# Patient Record
Sex: Male | Born: 1960 | Race: White | Hispanic: No | Marital: Married | Smoking: Never smoker
Health system: Southern US, Community
[De-identification: ages and names within clinical notes are randomized; demographics above are authoritative.]

## PROBLEM LIST (undated history)

## (undated) DIAGNOSIS — K219 Gastro-esophageal reflux disease without esophagitis: Secondary | ICD-10-CM

## (undated) DIAGNOSIS — E119 Type 2 diabetes mellitus without complications: Secondary | ICD-10-CM

## (undated) DIAGNOSIS — A159 Respiratory tuberculosis unspecified: Secondary | ICD-10-CM

## (undated) DIAGNOSIS — I10 Essential (primary) hypertension: Secondary | ICD-10-CM

## (undated) DIAGNOSIS — I209 Angina pectoris, unspecified: Secondary | ICD-10-CM

## (undated) DIAGNOSIS — E785 Hyperlipidemia, unspecified: Secondary | ICD-10-CM

## (undated) DIAGNOSIS — I219 Acute myocardial infarction, unspecified: Secondary | ICD-10-CM

## (undated) DIAGNOSIS — B029 Zoster without complications: Secondary | ICD-10-CM

## (undated) DIAGNOSIS — I2510 Atherosclerotic heart disease of native coronary artery without angina pectoris: Secondary | ICD-10-CM

## (undated) DIAGNOSIS — M1990 Unspecified osteoarthritis, unspecified site: Secondary | ICD-10-CM

## (undated) DIAGNOSIS — L03114 Cellulitis of left upper limb: Secondary | ICD-10-CM

## (undated) HISTORY — PX: COLONOSCOPY: SHX174

## (undated) HISTORY — DX: Cellulitis of left upper limb: L03.114

## (undated) HISTORY — PX: TONSILLECTOMY: SUR1361

---

## 2000-09-18 ENCOUNTER — Encounter (INDEPENDENT_AMBULATORY_CARE_PROVIDER_SITE_OTHER): Payer: Self-pay | Admitting: Specialist

## 2000-09-18 ENCOUNTER — Ambulatory Visit (HOSPITAL_BASED_OUTPATIENT_CLINIC_OR_DEPARTMENT_OTHER): Admission: RE | Admit: 2000-09-18 | Discharge: 2000-09-18 | Payer: Self-pay | Admitting: Oral Surgery

## 2001-10-03 ENCOUNTER — Encounter: Admission: RE | Admit: 2001-10-03 | Discharge: 2002-01-01 | Payer: Self-pay | Admitting: Family Medicine

## 2006-04-21 HISTORY — PX: PLANTAR FASCIA SURGERY: SHX746

## 2009-05-10 ENCOUNTER — Emergency Department (HOSPITAL_COMMUNITY): Admission: EM | Admit: 2009-05-10 | Discharge: 2009-05-11 | Payer: Self-pay | Admitting: Emergency Medicine

## 2011-02-28 LAB — BASIC METABOLIC PANEL
CO2: 29 mEq/L (ref 19–32)
Calcium: 9 mg/dL (ref 8.4–10.5)
Chloride: 100 mEq/L (ref 96–112)
GFR calc Af Amer: >60 mL/min (ref >60)
Potassium: 3.9 mEq/L (ref 3.5–5.1)
Sodium: 139 mEq/L (ref 135–145)

## 2011-02-28 LAB — CBC
HCT: 44.6 % (ref 39.0–52.0)
Hemoglobin: 15.2 g/dL (ref 13.0–17.0)
MCHC: 34.2 g/dL (ref 30.0–36.0)
RBC: 4.81 MIL/uL (ref 4.22–5.81)

## 2011-02-28 LAB — DIFFERENTIAL
Basophils Relative: 0 % (ref 0–1)
Eosinophils Relative: 1 % (ref 0–5)
Monocytes Absolute: 0.9 10*3/uL (ref 0.1–1.0)
Monocytes Relative: 9 % (ref 3–12)
Neutro Abs: 8.4 10*3/uL — ABNORMAL HIGH (ref 1.7–7.7)

## 2011-02-28 LAB — GLUCOSE, CAPILLARY: Glucose-Capillary: 150 mg/dL — ABNORMAL HIGH (ref 70–99)

## 2011-05-20 ENCOUNTER — Emergency Department (HOSPITAL_COMMUNITY)
Admission: EM | Admit: 2011-05-20 | Discharge: 2011-05-20 | Disposition: A | Discharge status: Home / Self Care | Payer: 59 | Attending: Emergency Medicine | Admitting: Emergency Medicine

## 2011-05-20 DIAGNOSIS — F1120 Opioid dependence, uncomplicated: Secondary | ICD-10-CM | POA: Insufficient documentation

## 2011-05-20 DIAGNOSIS — E7800 Pure hypercholesterolemia, unspecified: Secondary | ICD-10-CM | POA: Insufficient documentation

## 2011-05-20 DIAGNOSIS — E119 Type 2 diabetes mellitus without complications: Secondary | ICD-10-CM | POA: Insufficient documentation

## 2011-05-20 LAB — CBC
HCT: 49.5 % (ref 39.0–52.0)
MCHC: 33.9 g/dL (ref 30.0–36.0)
MCV: 91 fL (ref 78.0–100.0)
RDW: 13.3 % (ref 11.5–15.5)

## 2011-05-20 LAB — DIFFERENTIAL
Eosinophils Relative: 0 % (ref 0–5)
Lymphocytes Relative: 9 % — ABNORMAL LOW (ref 12–46)
Lymphs Abs: 1.1 10*3/uL (ref 0.7–4.0)
Monocytes Absolute: 0.8 10*3/uL (ref 0.1–1.0)

## 2011-05-20 LAB — COMPREHENSIVE METABOLIC PANEL
ALT: 41 U/L (ref 0–53)
AST: 27 U/L (ref 0–37)
CO2: 30 mEq/L (ref 19–32)
Chloride: 103 mEq/L (ref 96–112)
GFR calc non Af Amer: >60 mL/min (ref >60)
Potassium: 4.4 mEq/L (ref 3.5–5.1)
Sodium: 143 mEq/L (ref 135–145)
Total Bilirubin: 0.5 mg/dL (ref 0.3–1.2)

## 2011-05-20 LAB — RAPID URINE DRUG SCREEN, HOSP PERFORMED: Barbiturates: NOT DETECTED

## 2011-07-07 ENCOUNTER — Other Ambulatory Visit: Payer: Self-pay | Admitting: Podiatry

## 2011-07-07 DIAGNOSIS — M79673 Pain in unspecified foot: Secondary | ICD-10-CM

## 2011-07-21 ENCOUNTER — Other Ambulatory Visit: Payer: 59

## 2012-03-29 ENCOUNTER — Other Ambulatory Visit: Payer: Self-pay | Admitting: Family Medicine

## 2012-03-29 DIAGNOSIS — R209 Unspecified disturbances of skin sensation: Secondary | ICD-10-CM

## 2012-04-09 ENCOUNTER — Ambulatory Visit
Admission: RE | Admit: 2012-04-09 | Discharge: 2012-04-09 | Disposition: A | Discharge status: Home / Self Care | Payer: BC Managed Care – PPO | Source: Ambulatory Visit | Attending: Family Medicine | Admitting: Family Medicine

## 2012-04-09 DIAGNOSIS — R209 Unspecified disturbances of skin sensation: Secondary | ICD-10-CM

## 2012-10-02 ENCOUNTER — Other Ambulatory Visit: Payer: Self-pay | Admitting: Orthopedic Surgery

## 2012-10-02 ENCOUNTER — Encounter (HOSPITAL_BASED_OUTPATIENT_CLINIC_OR_DEPARTMENT_OTHER): Payer: Self-pay | Admitting: *Deleted

## 2012-10-02 NOTE — H&P (Signed)
  History of present illness: Patient is a pleasant 51 year old male coming in with left ankle pain.  Patient on some that he was running around with his kids and tripped over a branch.  Patient felt a pop and had severe pain and swelling of the lower extremity immediately.  Patient went in to an urgent care facility had x-rays done and was found to have a fracture of the ankle.  Patient was put in a Cam Walker made nonweightbearing on crutches and told to followup here.  Patient states that the pain has been still significant.  He has been using hydrocodone on a regular basis.  Patient is very concerned though because he needs to work for his livelihood and finds that this will be difficult.  Patient denies any numbness in the foot, describes.  No more as a shooting sharp pain with a constant dull ache.  Patient is now tried to be weightbearing since he was told not to.  Patient denies any fevers or chills.   PMHx: Diabetes Medications: Metformin and Lipitor.Allergies: No known drug allergies.  Hospitalizations: Unremarkable.Surgeries: Unremarkable.  ROS: 14 system review was done in unremarkable as related to the orthopedic problem.  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 Mood normal affect normal.  Weight: 265 pounds.  Height: 6 feet 1 inches.  BMI: 35.0.  Blood pressure: 100/70.  Left ankle exam: Patient does have 1+ effusion of the left ankle.  Patient does have some ecchymosis that is resolving.  He is tender to palpation over the medial as well as lateral malleolus.  Patient is nontender over the base of the fifth metatarsal.  Neurovascularly intact distally.  Skin exam: No signs of this infection.  No breakdown.  No rash. Respiratory exam: The patient speaks in full sentences.  No signs of stress.  No use of accessory  muscles.Vascular exam: Skins appropriately warm to touch.  No swelling.  No signs of compromise.  Xrays: X-rays were reviewed by me today.  Patient's x-rays from previous visit does show that he has a bimalleolar fracture.  Patient has a spiral fracture of the distal fibula as well as very transverse fracture of the distal medial malleolus.  This does show widening of the mortise.  Assessment: 1.  Bimalleolar left ankle fracture   Plan: At this time patient's ankle is unstable and does need surgical intervention for repair.  Discussed treatment options with patient and answered all questions.  Patient was given a prescription refill of Norco today.  Discussed with patient and likely be a short-arm splint for 3 months. I have had a prolonged discussion with the patient regarding the risk and benefits of the surgical procedure.  The patient understands the risks include but are not limited to bleeding infection and failure of the surgery to cure the problem and need for further surgery.  The patient understands there is a slight risk of death at the time of surgery.  The patient understands these risks along with the potential benefits and wishes to proceed with surgical intervention.  The patient will discuss the full surgical procedure with [facility] our surgical scheduler and the surgery will be set up at the patients convenience.  The patient will be followed in the office in the postoperative period.

## 2012-10-02 NOTE — Progress Notes (Signed)
Needs istat and ekg-computors were down-cindy could not do when pt here

## 2012-10-03 ENCOUNTER — Encounter (HOSPITAL_BASED_OUTPATIENT_CLINIC_OR_DEPARTMENT_OTHER)
Admission: RE | Disposition: A | Discharge status: Home / Self Care | Payer: Self-pay | Source: Ambulatory Visit | Attending: Orthopedic Surgery

## 2012-10-03 ENCOUNTER — Ambulatory Visit (HOSPITAL_BASED_OUTPATIENT_CLINIC_OR_DEPARTMENT_OTHER)
Admission: RE | Admit: 2012-10-03 | Discharge: 2012-10-03 | Disposition: A | Discharge status: Home / Self Care | Payer: BC Managed Care – PPO | Source: Ambulatory Visit | Attending: Orthopedic Surgery | Admitting: Orthopedic Surgery

## 2012-10-03 ENCOUNTER — Encounter (HOSPITAL_BASED_OUTPATIENT_CLINIC_OR_DEPARTMENT_OTHER): Payer: Self-pay | Admitting: *Deleted

## 2012-10-03 ENCOUNTER — Ambulatory Visit (HOSPITAL_BASED_OUTPATIENT_CLINIC_OR_DEPARTMENT_OTHER): Payer: BC Managed Care – PPO | Admitting: *Deleted

## 2012-10-03 DIAGNOSIS — S82843A Displaced bimalleolar fracture of unspecified lower leg, initial encounter for closed fracture: Secondary | ICD-10-CM

## 2012-10-03 DIAGNOSIS — E119 Type 2 diabetes mellitus without complications: Secondary | ICD-10-CM | POA: Insufficient documentation

## 2012-10-03 DIAGNOSIS — Z79899 Other long term (current) drug therapy: Secondary | ICD-10-CM | POA: Insufficient documentation

## 2012-10-03 DIAGNOSIS — X500XXA Overexertion from strenuous movement or load, initial encounter: Secondary | ICD-10-CM | POA: Insufficient documentation

## 2012-10-03 HISTORY — DX: Type 2 diabetes mellitus without complications: E11.9

## 2012-10-03 HISTORY — DX: Hyperlipidemia, unspecified: E78.5

## 2012-10-03 HISTORY — DX: Gastro-esophageal reflux disease without esophagitis: K21.9

## 2012-10-03 HISTORY — PX: ORIF ANKLE FRACTURE: SHX5408

## 2012-10-03 HISTORY — DX: Unspecified osteoarthritis, unspecified site: M19.90

## 2012-10-03 SURGERY — OPEN REDUCTION INTERNAL FIXATION (ORIF) ANKLE FRACTURE
Anesthesia: General | Site: Ankle | Laterality: Left | Wound class: Clean

## 2012-10-03 MED ORDER — BUPIVACAINE-EPINEPHRINE PF 0.5-1:200000 % IJ SOLN
INTRAMUSCULAR | Status: DC | PRN
  Administered 2012-10-03: 30 mL

## 2012-10-03 MED ORDER — OXYCODONE-ACETAMINOPHEN 5-325 MG PO TABS: 1.0000 | ORAL_TABLET | ORAL | Status: DC | PRN

## 2012-10-03 MED ORDER — BUPIVACAINE HCL (PF) 0.5 % IJ SOLN
INTRAMUSCULAR | Status: DC | PRN
  Administered 2012-10-03: 10 mL

## 2012-10-03 MED ORDER — CEFAZOLIN SODIUM-DEXTROSE 2-3 GM-% IV SOLR
2.0000 g | INTRAVENOUS | Status: AC
  Administered 2012-10-03: 3 g via INTRAVENOUS

## 2012-10-03 MED ORDER — OXYCODONE HCL 5 MG/5ML PO SOLN
5.0000 mg | Freq: Once | ORAL | Status: DC | PRN
Start: 2012-10-03 — End: 2012-10-03

## 2012-10-03 MED ORDER — MIDAZOLAM HCL 2 MG/2ML IJ SOLN
0.5000 mg | INTRAMUSCULAR | Status: DC | PRN
  Administered 2012-10-03: 2 mg via INTRAVENOUS

## 2012-10-03 MED ORDER — LIDOCAINE HCL (CARDIAC) 20 MG/ML IV SOLN
INTRAVENOUS | Status: DC | PRN
  Administered 2012-10-03: 30 mg via INTRAVENOUS

## 2012-10-03 MED ORDER — PROPOFOL 10 MG/ML IV BOLUS
INTRAVENOUS | Status: DC | PRN
  Administered 2012-10-03: 200 mg via INTRAVENOUS

## 2012-10-03 MED ORDER — LACTATED RINGERS IV SOLN
INTRAVENOUS | Status: DC
  Administered 2012-10-03 (×3): via INTRAVENOUS

## 2012-10-03 MED ORDER — OXYCODONE HCL 5 MG PO TABS: 5.0000 mg | ORAL_TABLET | Freq: Once | ORAL | Status: DC | PRN

## 2012-10-03 MED ORDER — ONDANSETRON HCL 4 MG/2ML IJ SOLN
INTRAMUSCULAR | Status: DC | PRN
  Administered 2012-10-03: 4 mg via INTRAVENOUS

## 2012-10-03 MED ORDER — CHLORHEXIDINE GLUCONATE 4 % EX LIQD: 60.0000 mL | Freq: Once | CUTANEOUS | Status: DC

## 2012-10-03 MED ORDER — HYDROMORPHONE HCL PF 1 MG/ML IJ SOLN: 0.2500 mg | INTRAMUSCULAR | Status: DC | PRN

## 2012-10-03 MED ORDER — FENTANYL CITRATE 0.05 MG/ML IJ SOLN
50.0000 ug | INTRAMUSCULAR | Status: DC | PRN
  Administered 2012-10-03: 100 ug via INTRAVENOUS

## 2012-10-03 SURGICAL SUPPLY — 77 items
BANDAGE ELASTIC 4 VELCRO ST LF (GAUZE/BANDAGES/DRESSINGS) ×4 IMPLANT
BANDAGE ELASTIC 6 VELCRO ST LF (GAUZE/BANDAGES/DRESSINGS) ×2 IMPLANT
BANDAGE ESMARK 6X9 LF (GAUZE/BANDAGES/DRESSINGS) ×1 IMPLANT
BIT DRILL 2.5X2.75 QC CALB (BIT) ×2 IMPLANT
BLADE SURG 10 STRL SS (BLADE) ×2 IMPLANT
BLADE SURG 15 STRL LF DISP TIS (BLADE) ×2 IMPLANT
BLADE SURG 15 STRL SS (BLADE) ×2
BNDG ESMARK 6X9 LF (GAUZE/BANDAGES/DRESSINGS) ×2
CANISTER SUCTION 1200CC (MISCELLANEOUS) ×2 IMPLANT
CHLORAPREP W/TINT 26ML (MISCELLANEOUS) ×2 IMPLANT
COVER TABLE BACK 60X90 (DRAPES) ×2 IMPLANT
CUFF TOURNIQUET SINGLE 24IN (TOURNIQUET CUFF) ×2 IMPLANT
CUFF TOURNIQUET SINGLE 34IN LL (TOURNIQUET CUFF) IMPLANT
DECANTER SPIKE VIAL GLASS SM (MISCELLANEOUS) IMPLANT
DRAPE EXTREMITY T 121X128X90 (DRAPE) ×2 IMPLANT
DRAPE OEC MINIVIEW 54X84 (DRAPES) ×2 IMPLANT
DRAPE SURG 17X23 STRL (DRAPES) ×2 IMPLANT
DRAPE U 20/CS (DRAPES) ×2 IMPLANT
DRAPE U-SHAPE 47X51 STRL (DRAPES) IMPLANT
DURA STEPPER LG (CAST SUPPLIES) ×2 IMPLANT
DURA STEPPER MED (CAST SUPPLIES) IMPLANT
DURA STEPPER SML (CAST SUPPLIES) IMPLANT
ELECT REM PT RETURN 9FT ADLT (ELECTROSURGICAL) ×2
ELECTRODE REM PT RTRN 9FT ADLT (ELECTROSURGICAL) ×1 IMPLANT
GAUZE SPONGE 4X4 16PLY XRAY LF (GAUZE/BANDAGES/DRESSINGS) IMPLANT
GAUZE XEROFORM 1X8 LF (GAUZE/BANDAGES/DRESSINGS) IMPLANT
GAUZE XEROFORM 5X9 LF (GAUZE/BANDAGES/DRESSINGS) ×2 IMPLANT
GLOVE BIO SURGEON STRL SZ7 (GLOVE) ×2 IMPLANT
GLOVE BIO SURGEON STRL SZ7.5 (GLOVE) ×2 IMPLANT
GLOVE BIOGEL PI IND STRL 7.0 (GLOVE) ×1 IMPLANT
GLOVE BIOGEL PI IND STRL 8 (GLOVE) ×2 IMPLANT
GLOVE BIOGEL PI INDICATOR 7.0 (GLOVE) ×1
GLOVE BIOGEL PI INDICATOR 8 (GLOVE) ×2
GOWN PREVENTION PLUS XLARGE (GOWN DISPOSABLE) ×4 IMPLANT
GOWN PREVENTION PLUS XXLARGE (GOWN DISPOSABLE) ×2 IMPLANT
KWIRE 4.0 X .045IN (WIRE) ×2 IMPLANT
KWIRE 4.0 X .062IN (WIRE) ×2 IMPLANT
NEEDLE HYPO 22GX1.5 SAFETY (NEEDLE) IMPLANT
NS IRRIG 1000ML POUR BTL (IV SOLUTION) ×2 IMPLANT
PACK BASIN DAY SURGERY FS (CUSTOM PROCEDURE TRAY) ×2 IMPLANT
PAD CAST 4YDX4 CTTN HI CHSV (CAST SUPPLIES) ×1 IMPLANT
PADDING CAST ABS 4INX4YD NS (CAST SUPPLIES) ×1
PADDING CAST ABS COTTON 4X4 ST (CAST SUPPLIES) ×1 IMPLANT
PADDING CAST COTTON 4X4 STRL (CAST SUPPLIES) ×1
PADDING CAST COTTON 6X4 STRL (CAST SUPPLIES) ×2 IMPLANT
PENCIL BUTTON HOLSTER BLD 10FT (ELECTRODE) ×2 IMPLANT
PLATE ACE 100DEG 7HOLE (Plate) ×2 IMPLANT
SCREW CANC LAG 4X45 (Screw) ×2 IMPLANT
SCREW CORTICAL 3.5MM  12MM (Screw) ×1 IMPLANT
SCREW CORTICAL 3.5MM  16MM (Screw) ×2 IMPLANT
SCREW CORTICAL 3.5MM 12MM (Screw) ×1 IMPLANT
SCREW CORTICAL 3.5MM 14MM (Screw) ×6 IMPLANT
SCREW CORTICAL 3.5MM 16MM (Screw) ×2 IMPLANT
SCREW NLOCK CANC HEX 4X16 (Screw) ×2 IMPLANT
SCREW NLOCK CANC HEX 4X35 (Screw) ×2 IMPLANT
SLEEVE SCD COMPRESS KNEE MED (MISCELLANEOUS) ×2 IMPLANT
SPLINT FAST PLASTER 5X30 (CAST SUPPLIES)
SPLINT PLASTER CAST FAST 5X30 (CAST SUPPLIES) IMPLANT
SPONGE GAUZE 4X4 12PLY (GAUZE/BANDAGES/DRESSINGS) ×2 IMPLANT
SPONGE LAP 18X18 X RAY DECT (DISPOSABLE) IMPLANT
SPONGE LAP 4X18 X RAY DECT (DISPOSABLE) ×2 IMPLANT
STAPLER VISISTAT (STAPLE) IMPLANT
STAPLER VISISTAT 35W (STAPLE) IMPLANT
SUCTION FRAZIER TIP 10 FR DISP (SUCTIONS) IMPLANT
SUT ETHILON 3 0 PS 1 (SUTURE) IMPLANT
SUT ETHILON 4 0 PS 2 18 (SUTURE) ×2 IMPLANT
SUT TICRON 1 T 12 (SUTURE) IMPLANT
SUT VIC AB 2-0 SH 27 (SUTURE)
SUT VIC AB 2-0 SH 27XBRD (SUTURE) IMPLANT
SUT VIC AB 3-0 FS2 27 (SUTURE) ×4 IMPLANT
SYR BULB 3OZ (MISCELLANEOUS) ×2 IMPLANT
SYR CONTROL 10ML LL (SYRINGE) IMPLANT
TOWEL OR 17X24 6PK STRL BLUE (TOWEL DISPOSABLE) ×2 IMPLANT
TUBE CONNECTING 20X1/4 (TUBING) ×2 IMPLANT
UNDERPAD 30X30 INCONTINENT (UNDERPADS AND DIAPERS) ×2 IMPLANT
WATER STERILE IRR 1000ML POUR (IV SOLUTION) ×2 IMPLANT
YANKAUER SUCT BULB TIP NO VENT (SUCTIONS) ×2 IMPLANT

## 2012-10-03 NOTE — Progress Notes (Signed)
Assisted Dr. Fitzgerald with left, ultrasound guided, popliteal/saphenous block. Side rails up, monitors on throughout procedure. See vital signs in flow sheet. Tolerated Procedure well. 

## 2012-10-03 NOTE — Anesthesia Preprocedure Evaluation (Addendum)
Anesthesia Evaluation  Patient identified by MRN, date of birth, ID band Patient awake    Reviewed: Allergy & Precautions, H&P , NPO status , Patient's Chart, lab work & pertinent test results  Airway Mallampati: II TM Distance: >3 FB Neck ROM: Full    Dental No notable dental hx. (+) Teeth Intact and Dental Advisory Given   Pulmonary neg pulmonary ROS,  breath sounds clear to auscultation  Pulmonary exam normal       Cardiovascular negative cardio ROS  Rhythm:Regular Rate:Normal     Neuro/Psych negative neurological ROS  negative psych ROS   GI/Hepatic Neg liver ROS, GERD-  Medicated and Controlled,  Endo/Other  diabetes, Type 2, Oral Hypoglycemic Agents  Renal/GU negative Renal ROS  negative genitourinary   Musculoskeletal   Abdominal   Peds  Hematology negative hematology ROS (+)   Anesthesia Other Findings   Reproductive/Obstetrics negative OB ROS                           Anesthesia Physical Anesthesia Plan  ASA: II  Anesthesia Plan: General and Regional   Post-op Pain Management:    Induction: Intravenous  Airway Management Planned: LMA  Additional Equipment:   Intra-op Plan:   Post-operative Plan: Extubation in OR  Informed Consent: I have reviewed the patients History and Physical, chart, labs and discussed the procedure including the risks, benefits and alternatives for the proposed anesthesia with the patient or authorized representative who has indicated his/her understanding and acceptance.   Dental advisory given  Plan Discussed with: CRNA  Anesthesia Plan Comments:         Anesthesia Quick Evaluation

## 2012-10-03 NOTE — Transfer of Care (Signed)
Immediate Anesthesia Transfer of Care Note  Patient: Jacob Downs  Procedure(s) Performed: Procedure(s) (LRB) with comments: OPEN REDUCTION INTERNAL FIXATION (ORIF) ANKLE FRACTURE (Left)  Patient Location: PACU  Anesthesia Type:GA combined with regional for post-op pain  Level of Consciousness: awake, sedated and patient cooperative  Airway & Oxygen Therapy: Patient Spontanous Breathing and Patient connected to face mask oxygen  Post-op Assessment: Report given to PACU RN and Post -op Vital signs reviewed and stable  Post vital signs: Reviewed and stable  Complications: No apparent anesthesia complications

## 2012-10-03 NOTE — Anesthesia Procedure Notes (Addendum)
Anesthesia Regional Block:  Popliteal block  Pre-Anesthetic Checklist: ,, timeout performed, Correct Patient, Correct Site, Correct Laterality, Correct Procedure, Correct Position, site marked, Risks and benefits discussed, pre-op evaluation, post-op pain management  Laterality: Left  Prep: Maximum Sterile Barrier Precautions used and chloraprep       Needles:  Injection technique: Single-shot  Needle Type: Echogenic Stimulator Needle          Additional Needles:  Procedures: ultrasound guided (picture in chart) and nerve stimulator Popliteal block  Nerve Stimulator or Paresthesia:  Response: Peroneal, 0.4 mA,  Response: Tibial, 0.4 mA,   Additional Responses:   Narrative:  Start time: 10/03/2012 2:00 PM End time: 10/03/2012 2:15 PM Injection made incrementally with aspirations every 5 mL. Anesthesiologist: Sampson Goon, MD  Additional Notes: 2% Lidocaine skin wheel. Saphenous block with 10cc of 0.5% Bupivicaine plain.  Popliteal block Procedure Name: LMA Insertion Date/Time: 10/03/2012 2:28 PM Performed by: Gar Gibbon Pre-anesthesia Checklist: Patient identified, Emergency Drugs available, Suction available and Patient being monitored Patient Re-evaluated:Patient Re-evaluated prior to inductionOxygen Delivery Method: Circle System Utilized Preoxygenation: Pre-oxygenation with 100% oxygen Intubation Type: IV induction Ventilation: Mask ventilation without difficulty LMA: LMA with gastric port inserted LMA Size: 5.0 Number of attempts: 1 Placement Confirmation: positive ETCO2 Tube secured with: Tape Dental Injury: Teeth and Oropharynx as per pre-operative assessment

## 2012-10-03 NOTE — Anesthesia Postprocedure Evaluation (Signed)
  Anesthesia Post-op Note  Patient: Jacob Downs  Procedure(s) Performed: Procedure(s) (LRB) with comments: OPEN REDUCTION INTERNAL FIXATION (ORIF) ANKLE FRACTURE (Left)  Patient Location: PACU  Anesthesia Type:GA combined with regional for post-op pain  Level of Consciousness: awake, alert  and oriented  Airway and Oxygen Therapy: Patient Spontanous Breathing  Post-op Pain: none  Post-op Assessment: Post-op Vital signs reviewed, Patient's Cardiovascular Status Stable, Respiratory Function Stable, Patent Airway and No signs of Nausea or vomiting  Post-op Vital Signs: Reviewed and stable  Complications: No apparent anesthesia complications

## 2012-10-03 NOTE — Op Note (Signed)
Preoperative diagnosis: Left ankle bimalleolar fracture unstable  Postoperative diagnosis: Same  Procedure: Open reduction internal fixation of left ankle bimalleolar fracture with 2 medial Dupuis 4 mm lag screws, on the lateral side we used a 7 hole one third tubular plate . Surgeon: Feliberto Gottron. Turner Daniels M.D.  First assistant: Shirl Harris PA-C  Anesthetic: Gen. LMA  Estimated blood loss: Minimal  Tourniquet time: 48 minutes  Indications for procedure: Patient slipped and fell 2 days ago, was seen at the urgent care Center and diagnosed with an unstable bimalleolar ankle fracture. At that time there is minimal widening of the mortise joint and patient was placed in a Cam Walker boot given crutches and told to followup with orthopedics. When patient presented to our office the mortise was widened 8-9 mm in the boot, indicating an unstable ankle fracture and we immediately scheduled  for surgery. Risks and benefits of surgery been discussed with the patient and patient is prepared for intervention to decrease pain and improve function.   Description of procedure: The patient was identified by arm band receive preoperative IV antibiotics in the holding area at, day surgery Center. Patient then received a right popliteal block, was taken to the operating room where the appropriate anesthetic monitors were attached and general LMA anesthesia was induced. A tourniquet was applied high to the left calf and prepped and draped in the usual sterile fashion from the toes to the tourniquet. A time out procedure was performed. The limb was then wrapped with an Esmarch bandage and the tourniquet inflated to 250 mm of mercury. A longitudinal incision was made 1/2 cm distal to the tip of the medial malleolus going proximally up over the malleolus for a distance of 5-6 cm. Small bleeders in the skin and subcutaneous tissue identified and cauterized periosteum around the fracture site was excised exposing the medial  malleolus fracture which was displaced. Using reduction forceps we then reduced the medial malleolus fracture and provisionally fixed it with a 0.625 K wire, and obtain C-arm images showing anatomic reduction. Anterior the K wire we then drilled a 2.5 mm hole and inserted a 45 mm x 4 mm DePuy partially-threaded cancellus screw obtaining good firm fixation. The posterior K wire was then removed and a second 35 mm x 4 mm partially-threaded cancellus screw was inserted giving 2 good screws in the medial malleolus. C-arm images were taken confirming anatomic reduction. On the lateral side we made an incision starting at the middle of the lateral malleolus and going proximally for 8 cm. Small bleeders in the subcutaneous tissue identified and cauterized and using tenotomy scissors we dissected down to the fracture site and reflected the periosteum anteriorly and posteriorly exposing the fracture. We removed blood and clot from the short oblique fracture site. We then reduced the fracture with a lion jaw clamp and a calibrated tenaculum. Because the fracture was about 3 cm above the mortise we selected a 7 hole one third tubular plate applied it to the shaft and lateral malleolus of the fibula and fixed the fracture with 4 bicortical proximal screws and 3 bicortical distal screws. A lateral drawer test was then performed confirming of the joint was both reduced and stable. Final C-arm images were obtained and saved. The tourniquet was let down small bleeders identified and cauterized, we then closed in layers with 3-0 Vicryl suture subcutaneously and subcuticular. A dressing of 0 from 4 x 4 dressing sponges web roll Ace wrap and a Cam Walker boot was then applied.  The patient was then awakened extubated and taken to the recovery without difficulty. She'll be 20-30 pounds weight bearing.

## 2012-10-04 ENCOUNTER — Encounter (HOSPITAL_BASED_OUTPATIENT_CLINIC_OR_DEPARTMENT_OTHER): Payer: Self-pay | Admitting: Orthopedic Surgery

## 2012-10-04 LAB — POCT I-STAT, CHEM 8
HCT: 45 % (ref 39.0–52.0)
Hemoglobin: 15.3 g/dL (ref 13.0–17.0)
Potassium: 4.6 mEq/L (ref 3.5–5.1)
Sodium: 139 mEq/L (ref 135–145)

## 2013-02-11 ENCOUNTER — Encounter: Payer: Self-pay | Admitting: Gastroenterology

## 2013-03-18 ENCOUNTER — Ambulatory Visit (AMBULATORY_SURGERY_CENTER): Payer: BC Managed Care – PPO | Admitting: *Deleted

## 2013-03-18 VITALS — Ht 72.0 in | Wt 250.0 lb

## 2013-03-18 DIAGNOSIS — Z1211 Encounter for screening for malignant neoplasm of colon: Secondary | ICD-10-CM

## 2013-03-18 MED ORDER — MOVIPREP 100 G PO SOLR: ORAL | Status: DC

## 2013-03-18 NOTE — Progress Notes (Signed)
emmi information sent to patient's wife e-mail. Wife & patient given emmi information.

## 2013-03-19 ENCOUNTER — Encounter: Payer: Self-pay | Admitting: Gastroenterology

## 2013-04-01 ENCOUNTER — Encounter: Payer: BC Managed Care – PPO | Admitting: Gastroenterology

## 2013-04-03 ENCOUNTER — Ambulatory Visit (AMBULATORY_SURGERY_CENTER): Payer: BC Managed Care – PPO | Admitting: Gastroenterology

## 2013-04-03 ENCOUNTER — Encounter: Payer: Self-pay | Admitting: Gastroenterology

## 2013-04-03 ENCOUNTER — Other Ambulatory Visit: Payer: Self-pay | Admitting: Gastroenterology

## 2013-04-03 VITALS — BP 126/75 | HR 69 | Temp 97.6°F | Resp 20 | Ht 72.0 in | Wt 250.0 lb

## 2013-04-03 DIAGNOSIS — Z538 Procedure and treatment not carried out for other reasons: Secondary | ICD-10-CM

## 2013-04-03 DIAGNOSIS — Z1211 Encounter for screening for malignant neoplasm of colon: Secondary | ICD-10-CM

## 2013-04-03 LAB — GLUCOSE, CAPILLARY
Glucose-Capillary: 109 mg/dL — ABNORMAL HIGH (ref 70–99)
Glucose-Capillary: 116 mg/dL — ABNORMAL HIGH (ref 70–99)

## 2013-04-03 MED ORDER — SODIUM CHLORIDE 0.9 % IV SOLN: 500.0000 mL | INTRAVENOUS | Status: DC

## 2013-04-03 NOTE — Patient Instructions (Addendum)
YOU HAD AN ENDOSCOPIC PROCEDURE TODAY AT THE [facility]: Refer to the procedure report that was given to you for any specific questions about what was found during the examination.  If the procedure report does not answer your questions, please call your gastroenterologist to clarify.  If you requested that your care partner not be given the details of your procedure findings, then the procedure report has been included in a sealed envelope for you to review at your convenience later.  YOU SHOULD EXPECT: Some feelings of bloating in the abdomen. Passage of more gas than usual.  Walking can help get rid of the air that was put into your GI tract during the procedure and reduce the bloating. If you had a lower endoscopy (such as a colonoscopy or flexible sigmoidoscopy) you may notice spotting of blood in your stool or on the toilet paper. If you underwent a bowel prep for your procedure, then you may not have a normal bowel movement for a few days.  DIET: Your first meal following the procedure should be a light meal and then it is ok to progress to your normal diet.  A half-sandwich or bowl of soup is an example of a good first meal.  Heavy or fried foods are harder to digest and may make you feel nauseous or bloated.  Likewise meals heavy in dairy and vegetables can cause extra gas to form and this can also increase the bloating.  Drink plenty of fluids but you should avoid alcoholic beverages for 24 hours.  ACTIVITY: Your care partner should take you home directly after the procedure.  You should plan to take it easy, moving slowly for the rest of the day.  You can resume normal activity the day after the procedure however you should NOT DRIVE or use heavy machinery for 24 hours (because of the sedation medicines used during the test).    SYMPTOMS TO REPORT IMMEDIATELY: A gastroenterologist can be reached at any hour.  During normal business hours, 8:30 AM to 5:00 PM Monday through Friday,  call [phone].  After hours and on weekends, please call the GI answering service at [phone] who will take a message and have the physician on call contact you.   Following lower endoscopy (colonoscopy or flexible sigmoidoscopy):  Excessive amounts of blood in the stool  Significant tenderness or worsening of abdominal pains  Swelling of the abdomen that is new, acute  Fever of 100F or higher  FOLLOW UP: If any biopsies were taken you will be contacted by phone or by letter within the next 1-3 weeks.  Call your gastroenterologist if you have not heard about the biopsies in 3 weeks.  Our staff will call the home number listed on your records the next business day following your procedure to check on you and address any questions or concerns that you may have at that time regarding the information given to you following your procedure. This is a courtesy call and so if there is no answer at the home number and we have not heard from you through the emergency physician on call, we will assume that you have returned to your regular daily activities without incident.  You had a poor prep.  You will need another colonoscopy next year with a double prep.  SIGNATURES/CONFIDENTIALITY: You and/or your care partner have signed paperwork which will be entered into your electronic medical record.  These signatures attest to the fact that that the information above on  your After Visit Summary has been reviewed and is understood.  Full responsibility of the confidentiality of this discharge information lies with you and/or your care-partner. 

## 2013-04-03 NOTE — Progress Notes (Signed)
Patient did not have preoperative order for IV antibiotic SSI prophylaxis. (G8918)  Patient did not experience any of the following events: a burn prior to discharge; a fall within the facility; wrong site/side/patient/procedure/implant event; or a hospital transfer or hospital admission upon discharge from the facility. (G8907)  

## 2013-04-03 NOTE — Op Note (Signed)
[facility] [address] [place], [address]   COLONOSCOPY PROCEDURE REPORT  PATIENT: Jacob Downs, Jacob Downs  MR#: [id number] BIRTHDATE: May 04, 1961 , 52  yrs. old GENDER: Male ENDOSCOPIST: Mardella Layman, MD, [facility] REFERRED BY: PROCEDURE DATE:  04/03/2013 PROCEDURE:   Colonoscopy, diagnostic ASA CLASS:   Class II INDICATIONS:Average risk patient for colon cancer. MEDICATIONS: propofol (Diprivan) 400mg  IV  DESCRIPTION OF PROCEDURE:   After the risks and benefits and of the procedure were explained, informed consent was obtained.  A digital rectal exam revealed no abnormalities of the rectum.    The LB [id number]  endoscope was introduced through the anus and advanced to the cecum, which was identified by both the appendix and ileocecal valve .  The quality of the prep was poor, using MoviPrep .  The instrument was then slowly withdrawn as the colon was fully examined.     COLON FINDINGS: The colon was redundant.  Manual abdominal counter-pressure was used to reach the cecum.   A normal appearing cecum, ileocecal valve, and appendiceal orifice were identified. The ascending, hepatic flexure, transverse, splenic flexure, descending, sigmoid colon and rectum appeared unremarkable.  No polyps or cancers were seen.     Retroflexed views revealed no abnormalities.     The scope was then withdrawn from the patient and the procedure completed.  COMPLICATIONS: There were no complications. ENDOSCOPIC IMPRESSION: 1.   The colon was redundant ..no lage polyps or obstruction note. 2.   Normal colon ..very limited exam per very,very poor prep!!!!!  RECOMMENDATIONS: Repeat Colonoscopy in 1 year...WILL NEED "DOUBLE PREP" PER LIMITED EXAM TODAY.....   REPEAT EXAM:  cc:  _______________________________ eSignedMardella Layman, MD, [facility] 04/03/2013 1:58 PM     PATIENT NAME:  [name], [name] MR#: [id number]

## 2013-04-04 ENCOUNTER — Telehealth: Payer: Self-pay | Admitting: *Deleted

## 2013-04-04 NOTE — Telephone Encounter (Signed)
No identifier, left message, follow-up  

## 2014-03-14 ENCOUNTER — Encounter: Payer: Self-pay | Admitting: Gastroenterology

## 2014-11-26 ENCOUNTER — Observation Stay (HOSPITAL_COMMUNITY): Payer: BLUE CROSS/BLUE SHIELD | Admitting: Anesthesiology

## 2014-11-26 ENCOUNTER — Encounter (HOSPITAL_COMMUNITY): Payer: Self-pay | Admitting: *Deleted

## 2014-11-26 ENCOUNTER — Observation Stay (HOSPITAL_COMMUNITY)
Admission: AD | Admit: 2014-11-26 | Discharge: 2014-11-26 | Disposition: A | Discharge status: Home / Self Care | Payer: BLUE CROSS/BLUE SHIELD | Source: Ambulatory Visit | Attending: Internal Medicine | Admitting: Internal Medicine

## 2014-11-26 ENCOUNTER — Other Ambulatory Visit: Payer: Self-pay | Admitting: Orthopedic Surgery

## 2014-11-26 ENCOUNTER — Encounter (HOSPITAL_COMMUNITY)
Admission: AD | Disposition: A | Discharge status: Home / Self Care | Payer: Self-pay | Source: Ambulatory Visit | Attending: Internal Medicine

## 2014-11-26 DIAGNOSIS — E785 Hyperlipidemia, unspecified: Secondary | ICD-10-CM | POA: Diagnosis not present

## 2014-11-26 DIAGNOSIS — Z7982 Long term (current) use of aspirin: Secondary | ICD-10-CM | POA: Insufficient documentation

## 2014-11-26 DIAGNOSIS — Z79899 Other long term (current) drug therapy: Secondary | ICD-10-CM | POA: Insufficient documentation

## 2014-11-26 DIAGNOSIS — L02511 Cutaneous abscess of right hand: Secondary | ICD-10-CM | POA: Diagnosis not present

## 2014-11-26 DIAGNOSIS — M1990 Unspecified osteoarthritis, unspecified site: Secondary | ICD-10-CM | POA: Insufficient documentation

## 2014-11-26 DIAGNOSIS — K219 Gastro-esophageal reflux disease without esophagitis: Secondary | ICD-10-CM | POA: Insufficient documentation

## 2014-11-26 DIAGNOSIS — E119 Type 2 diabetes mellitus without complications: Secondary | ICD-10-CM | POA: Insufficient documentation

## 2014-11-26 HISTORY — PX: I & D EXTREMITY: SHX5045

## 2014-11-26 HISTORY — DX: Zoster without complications: B02.9

## 2014-11-26 LAB — CBC WITH DIFFERENTIAL/PLATELET
Basophils Absolute: 0 10*3/uL (ref 0.0–0.1)
Basophils Relative: 0 % (ref 0–1)
Eosinophils Absolute: 0.1 10*3/uL (ref 0.0–0.7)
Eosinophils Relative: 1 % (ref 0–5)
HCT: 44.3 % (ref 39.0–52.0)
Hemoglobin: 15.4 g/dL (ref 13.0–17.0)
LYMPHS PCT: 20 % (ref 12–46)
Lymphs Abs: 1.4 10*3/uL (ref 0.7–4.0)
MCH: 31.5 pg (ref 26.0–34.0)
MCHC: 34.8 g/dL (ref 30.0–36.0)
MCV: 90.6 fL (ref 78.0–100.0)
MONO ABS: 0.7 10*3/uL (ref 0.1–1.0)
MONOS PCT: 10 % (ref 3–12)
Neutro Abs: 4.6 10*3/uL (ref 1.7–7.7)
Neutrophils Relative %: 69 % (ref 43–77)
PLATELETS: 148 10*3/uL — AB (ref 150–400)
RBC: 4.89 MIL/uL (ref 4.22–5.81)
RDW: 13 % (ref 11.5–15.5)
WBC: 6.7 10*3/uL (ref 4.0–10.5)

## 2014-11-26 LAB — BASIC METABOLIC PANEL
Anion gap: 11 (ref 5–15)
BUN: 13 mg/dL (ref 6–23)
CALCIUM: 9.2 mg/dL (ref 8.4–10.5)
CO2: 23 mmol/L (ref 19–32)
Chloride: 105 mEq/L (ref 96–112)
Creatinine, Ser: 0.74 mg/dL (ref 0.50–1.35)
GFR calc Af Amer: >90 mL/min (ref >90)
GFR calc non Af Amer: >90 mL/min (ref >90)
Glucose, Bld: 206 mg/dL — ABNORMAL HIGH (ref 70–99)
POTASSIUM: 4.7 mmol/L (ref 3.5–5.1)
SODIUM: 139 mmol/L (ref 135–145)

## 2014-11-26 LAB — GLUCOSE, CAPILLARY
Glucose-Capillary: 206 mg/dL — ABNORMAL HIGH (ref 70–99)
Glucose-Capillary: 187 mg/dL — ABNORMAL HIGH (ref 70–99)

## 2014-11-26 SURGERY — IRRIGATION AND DEBRIDEMENT EXTREMITY
Anesthesia: General | Site: Finger | Laterality: Right

## 2014-11-26 MED ORDER — HYDROMORPHONE HCL 1 MG/ML IJ SOLN
INTRAMUSCULAR | Status: AC
  Administered 2014-11-26: 0.5 mg via INTRAVENOUS
  Filled 2014-11-26: qty 1

## 2014-11-26 MED ORDER — SODIUM CHLORIDE 0.9 % IR SOLN
Status: DC | PRN
  Administered 2014-11-26: 1000 mL

## 2014-11-26 MED ORDER — LIDOCAINE HCL (CARDIAC) 20 MG/ML IV SOLN
INTRAVENOUS | Status: DC | PRN
  Administered 2014-11-26: 100 mg via INTRAVENOUS

## 2014-11-26 MED ORDER — FENTANYL CITRATE 0.05 MG/ML IJ SOLN
INTRAMUSCULAR | Status: AC
  Filled 2014-11-26: qty 5

## 2014-11-26 MED ORDER — ONDANSETRON HCL 4 MG/2ML IJ SOLN
INTRAMUSCULAR | Status: DC | PRN
  Administered 2014-11-26: 4 mg via INTRAVENOUS

## 2014-11-26 MED ORDER — VANCOMYCIN HCL 1000 MG IV SOLR
1000.0000 mg | INTRAVENOUS | Status: DC | PRN
  Administered 2014-11-26: 1000 mg via INTRAVENOUS

## 2014-11-26 MED ORDER — OXYCODONE-ACETAMINOPHEN 5-325 MG PO TABS
ORAL_TABLET | ORAL | Status: AC
  Filled 2014-11-26: qty 1

## 2014-11-26 MED ORDER — MIDAZOLAM HCL 2 MG/2ML IJ SOLN
INTRAMUSCULAR | Status: AC
  Filled 2014-11-26: qty 2

## 2014-11-26 MED ORDER — LACTATED RINGERS IV SOLN
INTRAVENOUS | Status: DC
  Administered 2014-11-26: 17:00:00 via INTRAVENOUS

## 2014-11-26 MED ORDER — PROPOFOL 10 MG/ML IV BOLUS
INTRAVENOUS | Status: DC | PRN
  Administered 2014-11-26: 190 mg via INTRAVENOUS

## 2014-11-26 MED ORDER — CHLORHEXIDINE GLUCONATE 4 % EX LIQD
60.0000 mL | Freq: Once | CUTANEOUS | Status: DC
  Filled 2014-11-26: qty 60

## 2014-11-26 MED ORDER — MIDAZOLAM HCL 5 MG/5ML IJ SOLN
INTRAMUSCULAR | Status: DC | PRN
  Administered 2014-11-26: 2 mg via INTRAVENOUS

## 2014-11-26 MED ORDER — OXYCODONE-ACETAMINOPHEN 5-325 MG PO TABS: 1.0000 | ORAL_TABLET | ORAL | Status: DC | PRN

## 2014-11-26 MED ORDER — OXYCODONE-ACETAMINOPHEN 5-325 MG PO TABS: 1.0000 | ORAL_TABLET | Freq: Once | ORAL | Status: DC

## 2014-11-26 MED ORDER — HYDROMORPHONE HCL 1 MG/ML IJ SOLN
0.2500 mg | INTRAMUSCULAR | Status: DC | PRN
  Administered 2014-11-26 (×4): 0.5 mg via INTRAVENOUS

## 2014-11-26 MED ORDER — VANCOMYCIN HCL IN DEXTROSE 1-5 GM/200ML-% IV SOLN
INTRAVENOUS | Status: AC
  Filled 2014-11-26: qty 200

## 2014-11-26 MED ORDER — FENTANYL CITRATE 0.05 MG/ML IJ SOLN
INTRAMUSCULAR | Status: DC | PRN
  Administered 2014-11-26: 100 ug via INTRAVENOUS
  Administered 2014-11-26: 50 ug via INTRAVENOUS

## 2014-11-26 MED ORDER — HYDROMORPHONE HCL 1 MG/ML IJ SOLN
INTRAMUSCULAR | Status: AC
  Filled 2014-11-26: qty 1

## 2014-11-26 MED ORDER — BUPIVACAINE HCL (PF) 0.25 % IJ SOLN
INTRAMUSCULAR | Status: DC | PRN
  Administered 2014-11-26: 2 mL

## 2014-11-26 MED ORDER — BUPIVACAINE HCL (PF) 0.25 % IJ SOLN
INTRAMUSCULAR | Status: AC
  Filled 2014-11-26: qty 30

## 2014-11-26 MED ORDER — PROMETHAZINE HCL 25 MG/ML IJ SOLN
6.2500 mg | INTRAMUSCULAR | Status: DC | PRN
Start: 2014-11-26 — End: 2014-11-27

## 2014-11-26 SURGICAL SUPPLY — 48 items
BANDAGE ELASTIC 3 VELCRO ST LF (GAUZE/BANDAGES/DRESSINGS) ×2 IMPLANT
BANDAGE ELASTIC 4 VELCRO ST LF (GAUZE/BANDAGES/DRESSINGS) ×2 IMPLANT
BNDG CMPR 9X4 STRL LF SNTH (GAUZE/BANDAGES/DRESSINGS)
BNDG CONFORM 2 STRL LF (GAUZE/BANDAGES/DRESSINGS) IMPLANT
BNDG ESMARK 4X9 LF (GAUZE/BANDAGES/DRESSINGS) IMPLANT
BNDG GAUZE ELAST 4 BULKY (GAUZE/BANDAGES/DRESSINGS) ×2 IMPLANT
CORDS BIPOLAR (ELECTRODE) ×2 IMPLANT
COVER SURGICAL LIGHT HANDLE (MISCELLANEOUS) ×4 IMPLANT
CUFF TOURNIQUET SINGLE 18IN (TOURNIQUET CUFF) ×2 IMPLANT
DECANTER SPIKE VIAL GLASS SM (MISCELLANEOUS) ×2 IMPLANT
DRAIN PENROSE 1/4X12 LTX STRL (WOUND CARE) IMPLANT
DRAPE SURG 17X23 STRL (DRAPES) ×2 IMPLANT
DRSG PAD ABDOMINAL 8X10 ST (GAUZE/BANDAGES/DRESSINGS) IMPLANT
DURAPREP 26ML APPLICATOR (WOUND CARE) IMPLANT
ELECT REM PT RETURN 9FT ADLT (ELECTROSURGICAL)
ELECTRODE REM PT RTRN 9FT ADLT (ELECTROSURGICAL) IMPLANT
GAUZE PACKING IODOFORM 1/4X5 (PACKING) IMPLANT
GAUZE SPONGE 4X4 12PLY STRL (GAUZE/BANDAGES/DRESSINGS) ×2 IMPLANT
GAUZE XEROFORM 1X8 LF (GAUZE/BANDAGES/DRESSINGS) IMPLANT
GLOVE SURG SYN 8.0 (GLOVE) ×2 IMPLANT
GOWN STRL REUS W/ TWL LRG LVL3 (GOWN DISPOSABLE) ×1 IMPLANT
GOWN STRL REUS W/ TWL XL LVL3 (GOWN DISPOSABLE) ×1 IMPLANT
GOWN STRL REUS W/TWL LRG LVL3 (GOWN DISPOSABLE) ×2
GOWN STRL REUS W/TWL XL LVL3 (GOWN DISPOSABLE) ×2
HANDPIECE INTERPULSE COAX TIP (DISPOSABLE)
KIT BASIN OR (CUSTOM PROCEDURE TRAY) ×2 IMPLANT
KIT ROOM TURNOVER OR (KITS) ×2 IMPLANT
MANIFOLD NEPTUNE II (INSTRUMENTS) ×2 IMPLANT
NEEDLE HYPO 25GX1X1/2 BEV (NEEDLE) IMPLANT
NEEDLE HYPO 25X1 1.5 SAFETY (NEEDLE) ×2 IMPLANT
NS IRRIG 1000ML POUR BTL (IV SOLUTION) ×2 IMPLANT
PACK ORTHO EXTREMITY (CUSTOM PROCEDURE TRAY) ×2 IMPLANT
PAD ARMBOARD 7.5X6 YLW CONV (MISCELLANEOUS) ×4 IMPLANT
PAD CAST 4YDX4 CTTN HI CHSV (CAST SUPPLIES) ×1 IMPLANT
PADDING CAST COTTON 4X4 STRL (CAST SUPPLIES) ×2
SET HNDPC FAN SPRY TIP SCT (DISPOSABLE) IMPLANT
SPONGE LAP 18X18 X RAY DECT (DISPOSABLE) ×2 IMPLANT
SUCTION FRAZIER TIP 10 FR DISP (SUCTIONS) ×2 IMPLANT
SUT VICRYL RAPIDE 4/0 PS 2 (SUTURE) ×2 IMPLANT
SYR 20CC LL (SYRINGE) IMPLANT
SYR CONTROL 10ML LL (SYRINGE) ×2 IMPLANT
TOWEL OR 17X24 6PK STRL BLUE (TOWEL DISPOSABLE) ×2 IMPLANT
TOWEL OR 17X26 10 PK STRL BLUE (TOWEL DISPOSABLE) ×2 IMPLANT
TUBE ANAEROBIC SPECIMEN COL (MISCELLANEOUS) ×2 IMPLANT
TUBE CONNECTING 12X1/4 (SUCTIONS) ×2 IMPLANT
UNDERPAD 30X30 INCONTINENT (UNDERPADS AND DIAPERS) ×2 IMPLANT
WATER STERILE IRR 1000ML POUR (IV SOLUTION) ×2 IMPLANT
YANKAUER SUCT BULB TIP NO VENT (SUCTIONS) IMPLANT

## 2014-11-26 NOTE — Anesthesia Preprocedure Evaluation (Signed)
Anesthesia Evaluation  Patient identified by MRN, date of birth, ID band Patient awake    Reviewed: Allergy & Precautions, NPO status , Patient's Chart, lab work & pertinent test results, Unable to perform ROS - Chart review only  History of Anesthesia Complications Negative for: history of anesthetic complications  Airway Mallampati: II  TM Distance: >3 FB Neck ROM: Full    Dental  (+) Teeth Intact,    Pulmonary neg pulmonary ROS,  breath sounds clear to auscultation        Cardiovascular Rhythm:Regular Rate:Normal     Neuro/Psych negative neurological ROS     GI/Hepatic GERD-  ,  Endo/Other  diabetesMorbid obesity  Renal/GU      Musculoskeletal  (+) Arthritis -,   Abdominal (+) + obese,   Peds  Hematology   Anesthesia Other Findings   Reproductive/Obstetrics                             Anesthesia Physical Anesthesia Plan  ASA: II and emergent  Anesthesia Plan: General   Post-op Pain Management:    Induction: Intravenous  Airway Management Planned: LMA  Additional Equipment:   Intra-op Plan:   Post-operative Plan: Extubation in OR  Informed Consent: I have reviewed the patients History and Physical, chart, labs and discussed the procedure including the risks, benefits and alternatives for the proposed anesthesia with the patient or authorized representative who has indicated his/her understanding and acceptance.   Dental advisory given  Plan Discussed with: CRNA and Surgeon  Anesthesia Plan Comments:         Anesthesia Quick Evaluation

## 2014-11-26 NOTE — Anesthesia Postprocedure Evaluation (Signed)
  Anesthesia Post-op Note  Patient: Jacob Downs  Procedure(s) Performed: Procedure(s): INCISION AND DRAINAGE OF RIGHT INDEX FINGER (Right)  Patient Location: PACU  Anesthesia Type:General  Level of Consciousness: awake and alert   Airway and Oxygen Therapy: Patient Spontanous Breathing  Post-op Pain: mild  Post-op Assessment: Post-op Vital signs reviewed  Post-op Vital Signs: stable  Last Vitals:  Filed Vitals:   11/26/14 2115  BP: 139/80  Pulse: 71  Temp:   Resp: 10    Complications: No apparent anesthesia complications

## 2014-11-26 NOTE — Op Note (Signed)
See note [id number]

## 2014-11-26 NOTE — Transfer of Care (Signed)
Immediate Anesthesia Transfer of Care Note  Patient: Jacob Downs  Procedure(s) Performed: Procedure(s): INCISION AND DRAINAGE OF RIGHT INDEX FINGER (Right)  Patient Location: PACU  Anesthesia Type:General  Level of Consciousness: awake, alert  and oriented  Airway & Oxygen Therapy: Patient Spontanous Breathing  Post-op Assessment: Report given to PACU RN and Post -op Vital signs reviewed and stable  Post vital signs: Reviewed and stable  Complications: No apparent anesthesia complications

## 2014-11-26 NOTE — H&P (Signed)
Jacob Downs is an 54 y.o. male.   Chief Complaint: right hand pain and swelling HPI: as above s/p 24 hour h/o increased pain and swelling around index MCPJ  Past Medical History  Diagnosis Date  . Diabetes mellitus without complication   . Hyperlipemia   . GERD (gastroesophageal reflux disease)   . Arthritis   . Shingles     4-5 years ago    Past Surgical History  Procedure Laterality Date  . Plantar fascia surgery  06,07    both feet  . Orif ankle fracture  10/03/2012    Procedure: OPEN REDUCTION INTERNAL FIXATION (ORIF) ANKLE FRACTURE;  Surgeon: Kerin Salen, MD;  Location: [place];  Service: Orthopedics;  Laterality: Left;  . Colonoscopy    . Tonsillectomy      as a child    Family History  Problem Relation Age of Onset  . Colon cancer Neg Hx    Social History:  reports that he has never smoked. He has never used smokeless tobacco. He reports that he drinks alcohol. He reports that he does not use illicit drugs.  Allergies: No Known Allergies  Medications Prior to Admission  Medication Sig Dispense Refill  . aspirin 81 MG tablet Take 81 mg by mouth daily.    Marland Kitchen atorvastatin (LIPITOR) 80 MG tablet Take 80 mg by mouth every evening.    . metFORMIN (GLUCOPHAGE) 500 MG tablet Take 500 mg by mouth 2 (two) times daily with a meal.    . ranitidine (ZANTAC) 150 MG tablet Take 150 mg by mouth as needed.     . famotidine (PEPCID) 20 MG tablet Take 20 mg by mouth as needed.      Results for orders placed or performed during the hospital encounter of 11/26/14 (from the past 48 hour(s))  CBC with Differential     Status: Abnormal   Collection Time: 11/26/14  4:08 PM  Result Value Ref Range   WBC 6.7 4.0 - 10.5 K/uL   RBC 4.89 4.22 - 5.81 MIL/uL   Hemoglobin 15.4 13.0 - 17.0 g/dL   HCT 44.3 39.0 - 52.0 %   MCV 90.6 78.0 - 100.0 fL   MCH 31.5 26.0 - 34.0 pg   MCHC 34.8 30.0 - 36.0 g/dL   RDW 13.0 11.5 - 15.5 %   Platelets 148 (L) 150 - 400 K/uL    Neutrophils Relative % 69 43 - 77 %   Neutro Abs 4.6 1.7 - 7.7 K/uL   Lymphocytes Relative 20 12 - 46 %   Lymphs Abs 1.4 0.7 - 4.0 K/uL   Monocytes Relative 10 3 - 12 %   Monocytes Absolute 0.7 0.1 - 1.0 K/uL   Eosinophils Relative 1 0 - 5 %   Eosinophils Absolute 0.1 0.0 - 0.7 K/uL   Basophils Relative 0 0 - 1 %   Basophils Absolute 0.0 0.0 - 0.1 K/uL  Basic metabolic panel     Status: Abnormal   Collection Time: 11/26/14  4:08 PM  Result Value Ref Range   Sodium 139 135 - 145 mmol/L    Comment: Please note change in reference range.   Potassium 4.7 3.5 - 5.1 mmol/L    Comment: Please note change in reference range.   Chloride 105 96 - 112 mEq/L   CO2 23 19 - 32 mmol/L   Glucose, Bld 206 (H) 70 - 99 mg/dL   BUN 13 6 - 23 mg/dL   Creatinine, Ser 0.74 0.50 -  1.35 mg/dL   Calcium 9.2 8.4 - 10.5 mg/dL   GFR calc non Af Amer >90 >90 mL/min   GFR calc Af Amer >90 >90 mL/min    Comment: (NOTE) The eGFR has been calculated using the CKD EPI equation. This calculation has not been validated in all clinical situations. eGFR's persistently <90 mL/min signify possible Chronic Kidney Disease.    Anion gap 11 5 - 15  Glucose, capillary     Status: Abnormal   Collection Time: 11/26/14  4:10 PM  Result Value Ref Range   Glucose-Capillary 206 (H) 70 - 99 mg/dL  Glucose, capillary     Status: Abnormal   Collection Time: 11/26/14  6:07 PM  Result Value Ref Range   Glucose-Capillary 187 (H) 70 - 99 mg/dL   No results found.  Review of Systems  All other systems reviewed and are negative.   Blood pressure 168/79, pulse 85, temperature 98.4 F (36.9 C), temperature source Oral, resp. rate 18, height 6' (1.829 m), weight 120.203 kg (265 lb), SpO2 97 %. Physical Exam  Constitutional: He is oriented to person, place, and time. He appears well-developed and well-nourished.  HENT:  Head: Normocephalic and atraumatic.  Cardiovascular: Normal rate.   Respiratory: Effort normal.   Musculoskeletal:       Right hand: He exhibits tenderness and swelling.  Right hand dorsal MCPJ swelling with ascending erythema onto hand and wrist  Neurological: He is alert and oriented to person, place, and time.  Skin: Skin is warm. There is erythema.  Psychiatric: He has a normal mood and affect. His behavior is normal. Judgment and thought content normal.     Assessment/Plan As above   Plan I and D  Makisha Marrin A 11/26/2014, 6:18 PM

## 2014-11-26 NOTE — Anesthesia Procedure Notes (Signed)
Procedure Name: LMA Insertion Date/Time: 11/26/2014 7:48 PM Performed by: Arlice ColtMANESS, Juni Glaab B Pre-anesthesia Checklist: Patient identified, Emergency Drugs available, Suction available, Patient being monitored and Timeout performed Patient Re-evaluated:Patient Re-evaluated prior to inductionOxygen Delivery Method: Circle system utilized Preoxygenation: Pre-oxygenation with 100% oxygen Intubation Type: IV induction LMA: LMA with gastric port inserted LMA Size: 5.0 Number of attempts: 1 Placement Confirmation: positive ETCO2 and breath sounds checked- equal and bilateral Tube secured with: Tape Dental Injury: Teeth and Oropharynx as per pre-operative assessment

## 2014-11-27 ENCOUNTER — Encounter (HOSPITAL_COMMUNITY): Payer: Self-pay | Admitting: Orthopedic Surgery

## 2014-11-27 NOTE — Op Note (Signed)
NAMHarless Litten:  Veronica, Tomer              ACCOUNT NO.:  [id number]  MEDICAL RECORD NO.:  [id number]  LOCATION:  [facility]                         FACILITY:  [facility]  PHYSICIAN:  Artist PaisMatthew A. Airica Schwartzkopf, M.D.DATE OF BIRTH:  May 19, 1961  DATE OF PROCEDURE:  11/26/2014 DATE OF DISCHARGE:  11/26/2014                              OPERATIVE REPORT   PREOPERATIVE DIAGNOSIS:  Right index finger, deep abscess.  POSTOPERATIVE DIAGNOSIS:  Right index finger, deep abscess.  PROCEDURE:  Incision and drainage of above with open packing.  SURGEON:  Artist PaisMatthew A. Mina MarbleWeingold, M.D.  ASSISTANT:  None.  ANESTHESIA:  General.  COMPLICATIONS:  None.  DRAINS:  None.  CULTURES:  Sent x2, Gram stain.  DESCRIPTION OF PROCEDURE:  The patient was taken to the operating suite. After the induction of adequate general anesthetic, right upper extremity was prepped and draped in usual sterile fashion.  Esmarch was used to exsanguinate the limb.  Tourniquet was inflated to 250 mmHg.  At this point in time, incision was made over the area between the metacarpophalangeal and proximal phalangeal joints, radial side, proximal phalanx, index finger, right.  Skin was incised.  Cloudy fluid and purulence were encountered.  This was cultured, aerobic, anaerobic, and Gram stain.  Dissection was carried down to the extensor sheath. There was some devitalized tissue that we removed.  The wound was irrigated with a liter of normal saline.  It was then packed open with quarter-inch Iodoform gauze, closed loosely with one 4-0 Vicryl Rapide suture in the middle of the wound.  4x4s fluffs and compressive bandage were applied.  The patient tolerated the procedure well, went to the recovery room in stable fashion.     Artist PaisMatthew A. Mina MarbleWeingold, M.D.    MAW/MEDQ  D:  11/26/2014  T:  11/27/2014  Job:  [id number]

## 2014-11-29 LAB — CULTURE, ROUTINE-ABSCESS: Gram Stain: NONE SEEN

## 2014-12-01 LAB — ANAEROBIC CULTURE: Gram Stain: NONE SEEN

## 2015-01-15 ENCOUNTER — Encounter: Payer: Self-pay | Admitting: Gastroenterology

## 2015-11-27 DIAGNOSIS — L03114 Cellulitis of left upper limb: Secondary | ICD-10-CM

## 2015-11-27 HISTORY — DX: Cellulitis of left upper limb: L03.114

## 2015-11-29 ENCOUNTER — Encounter (HOSPITAL_COMMUNITY): Payer: Self-pay | Admitting: Nurse Practitioner

## 2015-11-29 ENCOUNTER — Emergency Department (HOSPITAL_COMMUNITY): Payer: BLUE CROSS/BLUE SHIELD

## 2015-11-29 ENCOUNTER — Inpatient Hospital Stay (HOSPITAL_COMMUNITY)
Admission: EM | Admit: 2015-11-29 | Discharge: 2015-12-01 | DRG: 287 | Disposition: A | Discharge status: Home / Self Care | Payer: BLUE CROSS/BLUE SHIELD | Attending: Cardiology | Admitting: Cardiology

## 2015-11-29 DIAGNOSIS — Z8249 Family history of ischemic heart disease and other diseases of the circulatory system: Secondary | ICD-10-CM | POA: Diagnosis not present

## 2015-11-29 DIAGNOSIS — E669 Obesity, unspecified: Secondary | ICD-10-CM | POA: Diagnosis present

## 2015-11-29 DIAGNOSIS — I25110 Atherosclerotic heart disease of native coronary artery with unstable angina pectoris: Secondary | ICD-10-CM | POA: Diagnosis present

## 2015-11-29 DIAGNOSIS — E1165 Type 2 diabetes mellitus with hyperglycemia: Secondary | ICD-10-CM | POA: Diagnosis present

## 2015-11-29 DIAGNOSIS — I200 Unstable angina: Secondary | ICD-10-CM

## 2015-11-29 DIAGNOSIS — R079 Chest pain, unspecified: Secondary | ICD-10-CM

## 2015-11-29 DIAGNOSIS — Z7982 Long term (current) use of aspirin: Secondary | ICD-10-CM

## 2015-11-29 DIAGNOSIS — E119 Type 2 diabetes mellitus without complications: Secondary | ICD-10-CM | POA: Diagnosis not present

## 2015-11-29 DIAGNOSIS — E785 Hyperlipidemia, unspecified: Secondary | ICD-10-CM | POA: Diagnosis present

## 2015-11-29 DIAGNOSIS — L089 Local infection of the skin and subcutaneous tissue, unspecified: Secondary | ICD-10-CM

## 2015-11-29 DIAGNOSIS — Z7984 Long term (current) use of oral hypoglycemic drugs: Secondary | ICD-10-CM

## 2015-11-29 DIAGNOSIS — Z6833 Body mass index (BMI) 33.0-33.9, adult: Secondary | ICD-10-CM

## 2015-11-29 DIAGNOSIS — L02413 Cutaneous abscess of right upper limb: Secondary | ICD-10-CM | POA: Diagnosis present

## 2015-11-29 HISTORY — DX: Respiratory tuberculosis unspecified: A15.9

## 2015-11-29 LAB — BASIC METABOLIC PANEL
Anion gap: 12 (ref 5–15)
BUN: 10 mg/dL (ref 6–20)
CO2: 26 mmol/L (ref 22–32)
CREATININE: 0.73 mg/dL (ref 0.61–1.24)
Calcium: 8.9 mg/dL (ref 8.9–10.3)
Chloride: 101 mmol/L (ref 101–111)
GFR calc Af Amer: >60 mL/min (ref >60)
Glucose, Bld: 240 mg/dL — ABNORMAL HIGH (ref 65–99)
POTASSIUM: 4.2 mmol/L (ref 3.5–5.1)
Sodium: 139 mmol/L (ref 135–145)

## 2015-11-29 LAB — CBC
HCT: 47.2 % (ref 39.0–52.0)
Hemoglobin: 16.9 g/dL (ref 13.0–17.0)
MCH: 33 pg (ref 26.0–34.0)
MCHC: 35.8 g/dL (ref 30.0–36.0)
MCV: 92.2 fL (ref 78.0–100.0)
PLATELETS: 153 10*3/uL (ref 150–400)
RBC: 5.12 MIL/uL (ref 4.22–5.81)
RDW: 12.9 % (ref 11.5–15.5)
WBC: 6.1 10*3/uL (ref 4.0–10.5)

## 2015-11-29 LAB — I-STAT TROPONIN, ED: TROPONIN I, POC: 0 ng/mL (ref 0.00–0.08)

## 2015-11-29 LAB — GLUCOSE, CAPILLARY
GLUCOSE-CAPILLARY: 211 mg/dL — AB (ref 65–99)
GLUCOSE-CAPILLARY: 278 mg/dL — AB (ref 65–99)

## 2015-11-29 MED ORDER — SODIUM CHLORIDE 0.9 % IJ SOLN
3.0000 mL | Freq: Two times a day (BID) | INTRAMUSCULAR | Status: DC
  Administered 2015-11-29: 3 mL via INTRAVENOUS

## 2015-11-29 MED ORDER — DOXYCYCLINE HYCLATE 100 MG PO TABS
100.0000 mg | ORAL_TABLET | Freq: Two times a day (BID) | ORAL | Status: DC
  Administered 2015-11-29 – 2015-12-01 (×4): 100 mg via ORAL
  Filled 2015-11-29 (×4): qty 1

## 2015-11-29 MED ORDER — SODIUM CHLORIDE 0.9 % IV SOLN: 250.0000 mL | INTRAVENOUS | Status: DC | PRN

## 2015-11-29 MED ORDER — HEPARIN BOLUS VIA INFUSION
4000.0000 [IU] | Freq: Once | INTRAVENOUS | Status: AC
Start: 2015-11-29 — End: 2015-11-29
  Administered 2015-11-29: 4000 [IU] via INTRAVENOUS
  Filled 2015-11-29: qty 4000

## 2015-11-29 MED ORDER — CEPHALEXIN 500 MG PO CAPS: 500.0000 mg | ORAL_CAPSULE | Freq: Four times a day (QID) | ORAL | Status: DC

## 2015-11-29 MED ORDER — ACETAMINOPHEN 325 MG PO TABS
650.0000 mg | ORAL_TABLET | ORAL | Status: DC | PRN
  Administered 2015-11-30: 650 mg via ORAL
  Filled 2015-11-29: qty 2

## 2015-11-29 MED ORDER — INSULIN ASPART 100 UNIT/ML ~~LOC~~ SOLN
0.0000 [IU] | Freq: Three times a day (TID) | SUBCUTANEOUS | Status: DC
  Administered 2015-11-30: 3 [IU] via SUBCUTANEOUS
  Administered 2015-11-30: 2 [IU] via SUBCUTANEOUS
  Administered 2015-11-30: 3 [IU] via SUBCUTANEOUS
  Administered 2015-12-01: 5 [IU] via SUBCUTANEOUS

## 2015-11-29 MED ORDER — ONDANSETRON HCL 4 MG/2ML IJ SOLN: 4.0000 mg | Freq: Four times a day (QID) | INTRAMUSCULAR | Status: DC | PRN

## 2015-11-29 MED ORDER — SODIUM CHLORIDE 0.9 % WEIGHT BASED INFUSION: 3.0000 mL/kg/h | INTRAVENOUS | Status: DC

## 2015-11-29 MED ORDER — SODIUM CHLORIDE 0.9 % IV SOLN
250.0000 mL | INTRAVENOUS | Status: DC | PRN
Start: 2015-11-29 — End: 2015-11-30

## 2015-11-29 MED ORDER — ASPIRIN 81 MG PO CHEW: 324.0000 mg | CHEWABLE_TABLET | Freq: Once | ORAL | Status: DC

## 2015-11-29 MED ORDER — FAMOTIDINE 20 MG PO TABS
20.0000 mg | ORAL_TABLET | Freq: Two times a day (BID) | ORAL | Status: DC
  Administered 2015-11-29 – 2015-12-01 (×3): 20 mg via ORAL
  Filled 2015-11-29 (×4): qty 1

## 2015-11-29 MED ORDER — ASPIRIN EC 81 MG PO TBEC
81.0000 mg | DELAYED_RELEASE_TABLET | Freq: Every day | ORAL | Status: DC
  Administered 2015-11-30 – 2015-12-01 (×2): 81 mg via ORAL
  Filled 2015-11-29: qty 1

## 2015-11-29 MED ORDER — BUPIVACAINE HCL (PF) 0.5 % IJ SOLN
10.0000 mL | Freq: Once | INTRAMUSCULAR | Status: AC
  Administered 2015-11-29: 10 mL
  Filled 2015-11-29: qty 10

## 2015-11-29 MED ORDER — SODIUM CHLORIDE 0.9 % IJ SOLN
3.0000 mL | INTRAMUSCULAR | Status: DC | PRN
Start: 2015-11-29 — End: 2015-11-30

## 2015-11-29 MED ORDER — NITROGLYCERIN 0.4 MG SL SUBL: 0.4000 mg | SUBLINGUAL_TABLET | SUBLINGUAL | Status: DC | PRN

## 2015-11-29 MED ORDER — SODIUM CHLORIDE 0.9 % WEIGHT BASED INFUSION
1.0000 mL/kg/h | INTRAVENOUS | Status: DC
  Administered 2015-11-30: 1 mL/kg/h via INTRAVENOUS

## 2015-11-29 MED ORDER — ASPIRIN 81 MG PO CHEW: 324.0000 mg | CHEWABLE_TABLET | ORAL | Status: AC

## 2015-11-29 MED ORDER — SODIUM CHLORIDE 0.9 % IJ SOLN: 3.0000 mL | INTRAMUSCULAR | Status: DC | PRN

## 2015-11-29 MED ORDER — ATORVASTATIN CALCIUM 80 MG PO TABS
80.0000 mg | ORAL_TABLET | Freq: Every evening | ORAL | Status: DC
  Administered 2015-11-29 – 2015-11-30 (×2): 80 mg via ORAL
  Filled 2015-11-29: qty 2
  Filled 2015-11-29: qty 1

## 2015-11-29 MED ORDER — ASPIRIN 300 MG RE SUPP: 300.0000 mg | RECTAL | Status: AC

## 2015-11-29 MED ORDER — HEPARIN (PORCINE) IN NACL 100-0.45 UNIT/ML-% IJ SOLN
1600.0000 [IU]/h | INTRAMUSCULAR | Status: DC
  Administered 2015-11-29: 1400 [IU]/h via INTRAVENOUS
  Filled 2015-11-29 (×2): qty 250

## 2015-11-29 MED ORDER — SODIUM CHLORIDE 0.9 % IJ SOLN
3.0000 mL | Freq: Two times a day (BID) | INTRAMUSCULAR | Status: DC
  Administered 2015-12-01: 3 mL via INTRAVENOUS

## 2015-11-29 MED ORDER — CEPHALEXIN 500 MG PO CAPS: 500.0000 mg | ORAL_CAPSULE | Freq: Two times a day (BID) | ORAL | Status: DC

## 2015-11-29 MED ORDER — DOXYCYCLINE HYCLATE 100 MG PO TABS: 100.0000 mg | ORAL_TABLET | Freq: Two times a day (BID) | ORAL | Status: DC

## 2015-11-29 MED ORDER — METOPROLOL TARTRATE 25 MG PO TABS
25.0000 mg | ORAL_TABLET | Freq: Two times a day (BID) | ORAL | Status: DC
  Administered 2015-11-29 – 2015-11-30 (×2): 25 mg via ORAL
  Filled 2015-11-29 (×4): qty 1

## 2015-11-29 MED ORDER — ASPIRIN 81 MG PO CHEW
81.0000 mg | CHEWABLE_TABLET | ORAL | Status: AC
  Administered 2015-11-30: 81 mg via ORAL
  Filled 2015-11-29: qty 1

## 2015-11-29 MED ORDER — INSULIN ASPART 100 UNIT/ML ~~LOC~~ SOLN
0.0000 [IU] | Freq: Every day | SUBCUTANEOUS | Status: DC
  Administered 2015-11-29: 3 [IU] via SUBCUTANEOUS
  Administered 2015-11-30: 2 [IU] via SUBCUTANEOUS

## 2015-11-29 MED ORDER — ASPIRIN 81 MG PO CHEW
81.0000 mg | CHEWABLE_TABLET | Freq: Once | ORAL | Status: AC
  Administered 2015-11-29: 81 mg via ORAL
  Filled 2015-11-29: qty 1

## 2015-11-29 NOTE — ED Notes (Addendum)
Pt reports started going to gym Monday, noticed after he has noticed CP with exertion especially walking up stairs, and movement. He denies any SOB, nausea, diaphoresis. Pain is in L side of chest. He went to PCP Friday who was going to order a stress test but they never heard back- dr little at [facility].

## 2015-11-29 NOTE — Consult Note (Signed)
ORTHOPAEDIC CONSULTATION HISTORY & PHYSICAL REQUESTING PHYSICIAN: Lewayne Bunting, MD  Chief Complaint: Right dorsal hand subcutaneous abscess  HPI: Jacob Downs is a 55 y.o. male who was admitted today with unstable angina, and preparation of heart catheterization tomorrow. At the time of admission, it was discovered that he had a forming abscess in the subcutaneous tissues of the right dorsal hand. He reports that in January 2016, he had one on the dorsal aspect of the right index finger. Cultures at that time were consistent with OSSA. It resolved following debridement and oral antibiotics. The emergency department provider performed incision and drainage today, with irrigation and packing placed. He is on Keflex and doxycycline.  Past Medical History  Diagnosis Date  . Diabetes mellitus without complication (HCC)   . Hyperlipemia   . GERD (gastroesophageal reflux disease)   . Arthritis   . Shingles     4-5 years ago  . Tuberculosis     As a child   Past Surgical History  Procedure Laterality Date  . Plantar fascia surgery  06,07    both feet  . Orif ankle fracture  10/03/2012    Procedure: OPEN REDUCTION INTERNAL FIXATION (ORIF) ANKLE FRACTURE;  Surgeon: Nestor Lewandowsky, MD;  Location: [facility];  Service: Orthopedics;  Laterality: Left;  . Colonoscopy    . Tonsillectomy      as a child  . I&d extremity Right 11/26/2014    Procedure: INCISION AND DRAINAGE OF RIGHT INDEX FINGER;  Surgeon: Dairl Ponder, MD;  Location[facility];  Service: Orthopedics;  Laterality: Right;   Social History   Social History  . Marital Status: Married    Spouse Name: N/A  . Number of Children: 2  . Years of Education: N/A   Social History Main Topics  . Smoking status: Never Smoker   . Smokeless tobacco: Never Used  . Alcohol Use: 0.0 oz/week    0 Standard drinks or equivalent per week     Comment: 6 to 12 beers per week  . Drug Use: No  . Sexual Activity: Not Asked    Other Topics Concern  . None   Social History Narrative   Family History  Problem Relation Age of Onset  . Colon cancer Neg Hx   . CAD      No family history   No Known Allergies Prior to Admission medications   Medication Sig Start Date End Date Taking? Authorizing Provider  aspirin 81 MG tablet Take 81 mg by mouth daily.   Yes Historical Provider, MD  atorvastatin (LIPITOR) 20 MG tablet Take 20 mg by mouth every evening.   Yes Historical Provider, MD  metFORMIN (GLUCOPHAGE) 500 MG tablet Take 500-1,000 mg by mouth 2 (two) times daily with a meal. Take 2 tablets in the morning Take 1 tablet in the evening   Yes Historical Provider, MD  famotidine (PEPCID) 20 MG tablet Take 20 mg by mouth as needed for heartburn.     Historical Provider, MD  oxyCODONE-acetaminophen (ROXICET) 5-325 MG per tablet Take 1 tablet by mouth every 4 (four) hours as needed for severe pain. 11/26/14   Dairl Ponder, MD   Dg Chest 2 View  11/29/2015  CLINICAL DATA:  Intermittent left upper chest pain when lying down for 6 days. Initial encounter. EXAM: CHEST  2 VIEW COMPARISON:  None. FINDINGS: The lungs are clear. Heart size is normal. There is no pneumothorax or pleural effusion. No focal bony abnormality. IMPRESSION: Negative chest.  Electronically Signed   By: Drusilla Kannerhomas  Dalessio M.D.   On: 11/29/2015 13:46    Positive ROS: All other systems have been reviewed and were otherwise negative with the exception of those mentioned in the HPI and as above.  Physical Exam: Vitals: Refer to EMR. Constitutional:  WD, WN, NAD HEENT:  NCAT, EOMI Neuro/Psych:  Alert & oriented to person, place, and time; appropriate mood & affect Lymphatic: No generalized extremity edema or lymphadenopathy Extremities / MSK:  The extremities are normal with respect to appearance, ranges of motion, joint stability, muscle strength/tone, sensation, & perfusion except as otherwise noted:  Photographs that I viewed early in the day  revealed small forming area of abscess on the dorsum of the right hand/wrist. There was some surrounding induration, with the whole process about 1-1/2-2 cm. Now, the bandages in place. The dorsum of the hand is slightly swollen in the digits move well.  Assessment: Status post right hand subcutaneous abscess incision and drainage by the emergency department, with retained packing  Recommendations: I discussed his situation with the patient and his wife. Just a year ago, he had been through this to an even larger extent with the right index finger, which required incision and drainage in the operating room. We will plan to have the packing pulled on Tuesday, and begin routine wound care at that time with daily cleansing and application of dressing until no further drainage is evident. He will probably require 10-14 days of oral antibiotics. I do not believe that emergent department obtained a culture, so our antibiotic selection will necessarily be empiric. Although his previous culture was OSSA, since this is a new infection in a neighboring site, the possibility of MRSA must be entertained.  I indicated to him that I'm happy to provide follow-up if he determines it necessary based upon his familiarity and experience with resolution of his previous problem.  Cliffton Astersavid A. Janee Mornhompson, MD      Orthopaedic & Hand Surgery [facility] [address] [place], [place]  [id number] Office: [phone] Mobile: [phone]

## 2015-11-29 NOTE — ED Provider Notes (Signed)
CSN: [id number]     Arrival date & time 11/29/15  1145 History   First MD Initiated Contact with Patient 11/29/15 1232     Chief Complaint  Patient presents with  . Chest Pain     (Consider location/radiation/quality/duration/timing/severity/associated sxs/prior Treatment) HPI   This is a 55 year old man who presents emergency Department with chief complaint of chest pain. He has a past medical history of obesity, diabetes, hyperlipidemia. Patient is hypertensive today, but denies a previous history of hypertension. Patient recently began going to the gym 2 weeks ago. Over the past week he has had left-sided pressure-like exertional chest pain and radiation into the left arm. He states that at work as he has been walking. There've been times had to stop and leaned over, clutching his chest. He states that the pain is often about 30 seconds. The patient spoke with his primary care physician this past week with going to set him up with an outpatient stress test. However, this was not achieved because of inclement weather. The patient became concerned enough to come to the ER because yesterday he walked up a flight of stairs in his home and had severe left-sided chest pain. He states he clutched his left chest and had to lay down. He denies shortness of breath or nausea.  Past Medical History  Diagnosis Date  . Diabetes mellitus without complication (HCC)   . Hyperlipemia   . GERD (gastroesophageal reflux disease)   . Arthritis   . Shingles     4-5 years ago  . Tuberculosis     As a child   Past Surgical History  Procedure Laterality Date  . Plantar fascia surgery  06,07    both feet  . Orif ankle fracture  10/03/2012    Procedure: OPEN REDUCTION INTERNAL FIXATION (ORIF) ANKLE FRACTURE;  Surgeon: Nestor LewandowskyFrank J Rowan, MD;  Location: [facility];  Service: Orthopedics;  Laterality: Left;  . Colonoscopy    . Tonsillectomy      as a child  . I&d extremity Right 11/26/2014   Procedure: INCISION AND DRAINAGE OF RIGHT INDEX FINGER;  Surgeon: Dairl PonderMatthew Weingold, MD;  Location: [facility];  Service: Orthopedics;  Laterality: Right;   Family History  Problem Relation Age of Onset  . Colon cancer Neg Hx   . CAD      No family history   Social History  Substance Use Topics  . Smoking status: Never Smoker   . Smokeless tobacco: Never Used  . Alcohol Use: 0.0 oz/week    0 Standard drinks or equivalent per week     Comment: 6 to 12 beers per week    Review of Systems  Ten systems reviewed and are negative for acute change, except as noted in the HPI.    Allergies  Review of patient's allergies indicates no known allergies.  Home Medications   Prior to Admission medications   Medication Sig Start Date End Date Taking? Authorizing Provider  aspirin 81 MG tablet Take 81 mg by mouth daily.   Yes Historical Provider, MD  atorvastatin (LIPITOR) 20 MG tablet Take 20 mg by mouth every evening.   Yes Historical Provider, MD  metFORMIN (GLUCOPHAGE) 500 MG tablet Take 500-1,000 mg by mouth 2 (two) times daily with a meal. Take 2 tablets in the morning Take 1 tablet in the evening   Yes Historical Provider, MD  famotidine (PEPCID) 20 MG tablet Take 20 mg by mouth as needed for heartburn.     Historical  Provider, MD  oxyCODONE-acetaminophen (ROXICET) 5-325 MG per tablet Take 1 tablet by mouth every 4 (four) hours as needed for severe pain. 11/26/14   Dairl Ponder, MD   BP 134/100 mmHg  Pulse 75  Temp(Src) 97.7 F (36.5 C) (Oral)  Resp 20  SpO2 98% Physical Exam  Constitutional: He appears well-developed and well-nourished. No distress.  HENT:  Head: Normocephalic and atraumatic.  Eyes: Conjunctivae are normal. No scleral icterus.  Neck: Normal range of motion. Neck supple.  Cardiovascular: Normal rate, regular rhythm and normal heart sounds.   Pulmonary/Chest: Effort normal and breath sounds normal. No respiratory distress.  Abdominal: Soft. Bowel sounds are  normal. There is no tenderness.  Musculoskeletal: He exhibits no edema.  Neurological: He is alert.  Skin: Skin is warm and dry. He is not diaphoretic.  Psychiatric: His behavior is normal.  Nursing note and vitals reviewed.   ED Course  .Marland KitchenIncision and Drainage Date/Time: 11/29/2015 3:41 PM Performed by: Arthor Captain Authorized by: Arthor Captain Consent: Verbal consent obtained. Risks and benefits: risks, benefits and alternatives were discussed Consent given by: patient Patient identity confirmed: provided demographic data Type: abscess Body area: upper extremity Location details: right wrist Anesthesia: local infiltration Local anesthetic: bupivacaine 0.5% without epinephrine Anesthetic total: 5 ml Patient sedated: no Scalpel size: 11 Incision type: single straight Incision depth: dermal Complexity: simple Drainage: bloody and  serous Drainage amount: scant Wound treatment: wound left open Packing material: 1/4 in iodoform gauze Patient tolerance: Patient tolerated the procedure well with no immediate complications   (including critical care time) Labs Review Labs Reviewed  BASIC METABOLIC PANEL - Abnormal; Notable for the following:    Glucose, Bld 240 (*)    All other components within normal limits  CBC  I-STAT TROPOININ, ED    Imaging Review Dg Chest 2 View  11/29/2015  CLINICAL DATA:  Intermittent left upper chest pain when lying down for 6 days. Initial encounter. EXAM: CHEST  2 VIEW COMPARISON:  None. FINDINGS: The lungs are clear. Heart size is normal. There is no pneumothorax or pleural effusion. No focal bony abnormality. IMPRESSION: Negative chest. Electronically Signed   By: Drusilla Kanner M.D.   On: 11/29/2015 13:46   I have personally reviewed and evaluated these images and lab results as part of my medical decision-making.   EKG Interpretation None      ECG interpretation   Date: 11/29/2015  Rate: 80  Rhythm: normal sinus rhythm  QRS  Axis: normal  Intervals: normal  ST/T Wave abnormalities: normal  Conduction Disutrbances: none  Narrative Interpretation:   Old EKG Reviewed: No significant changes noted    MDM    1:10 PM BP 134/100 mmHg  Pulse 75  Temp(Src) 97.7 F (36.5 C) (Oral)  Resp 20  SpO2 98% Patient with symptoms concerning for unstable angina. I have spoken with Dr. Donato Schultz who will admit the patient for cardiac workup and probable cardiac catheterization tomorrow. Patient and his family are in agreement with plan of care. EKG shows no abnormalities. Initial troponin is negative. Patient does have an elevated blood glucose at 240. Patient is stable throughout his ED visit and appears safe for admission. He is chest pain-free during the visit. Patient's HEART score is 5 making him moderate risk for MACE.  Final diagnoses:  Chest pain with moderate risk of acute coronary syndrome  Type 2 diabetes mellitus with hyperglycemia, unspecified long term insulin use status (HCC)  Skin infection   3:47 PM Patient with a  right wrist. Skin abscess. I spoke with Dr. Janee Morn who had been contacted by cardiology. I discussed the abscess and stated that I did not feel that it was yet necessary for an INT as it is extremely indurated without fluctuance. I did get an ultrasound and saw a minimal fluid collection. I spoke with Dr. Janee Morn and offered to perform the I&D here in the emergency department. Dr. Janee Morn agrees. He will see the patient tomorrow inpatient. Placed iodoform gauze as directed by Dr. Janee Morn. The patient tolerated the procedure well.   Arthor Captain, PA-C 11/29/15 1547  Arby Barrette, MD 11/29/15 [phone]

## 2015-11-29 NOTE — Progress Notes (Signed)
ANTICOAGULATION CONSULT NOTE - Initial Consult  Pharmacy Consult for heparin Indication: chest pain/ACS  No Known Allergies  Patient Measurements: Height: 6\' 1"  (185.4 cm) Weight: 264 lb 15.9 oz (120.2 kg) IBW/kg (Calculated) : 79.9 Heparin Dosing Weight: 106kg  Vital Signs: Temp: 98.1 F (36.7 C) (01/08 1742) Temp Source: Oral (01/08 1742) BP: 132/78 mmHg (01/08 1742) Pulse Rate: 74 (01/08 1742)  Labs:  Recent Labs  11/29/15 1158  HGB 16.9  HCT 47.2  PLT 153  CREATININE 0.73    Estimated Creatinine Clearance: 143.3 mL/min (by C-G formula based on Cr of 0.73).   Medical History: Past Medical History  Diagnosis Date  . Diabetes mellitus without complication (HCC)   . Hyperlipemia   . GERD (gastroesophageal reflux disease)   . Arthritis   . Shingles     4-5 years ago  . Tuberculosis     As a child    Assessment: [age] YOM here with chest pain that started over the past week- initially occurred with exertion, now occurring with less exertion (ie on treadmill vs walking up stairs). Cycling enzymes and to start IV heparin. He is not on anticoagulation PTA. Hgb 16.9, plts 153. No bleeding noted.  Goal of Therapy:  Heparin level 0.3-0.7 units/ml Monitor platelets by anticoagulation protocol: Yes   Plan:  -heparin bolus with 4000 units IV x1 followed by infusion at 1400 units/hr -HL in 6h -daily HL and CBC -follow cards plans  Kizzi Overbey D. April Colter, PharmD, BCPS Clinical Pharmacist Pager: [phone] 11/29/2015 5:52 PM

## 2015-11-29 NOTE — ED Notes (Signed)
Pt took 3 baby asa PTA

## 2015-11-29 NOTE — H&P (Signed)
Primary cardiologist: New  HPI: 55 year old male with past medical history of diabetes mellitus and hyperlipidemia with unstable angina. No prior cardiac history. Over the past 1 week the patient noticed initially that he was having chest pain on the treadmill that would improve with slowing down. The pain was in the left breast area without radiation. No associated symptoms. Described as a dull sensation. The pain has occurred with less activity through the week. He had more severe pain after climbing stairs at home last evening. It lasted for 8-10 minutes after stopping. He is presently pain-free. He denies dyspnea, orthopnea, PND or pedal edema. No syncope.   (Not in a hospital admission)  No Known Allergies   Past Medical History  Diagnosis Date  . Diabetes mellitus without complication ([place])   . Hyperlipemia   . GERD (gastroesophageal reflux disease)   . Arthritis   . Shingles     4-5 years ago  . Tuberculosis     As a child    Past Surgical History  Procedure Laterality Date  . Plantar fascia surgery  06,07    both feet  . Orif ankle fracture  10/03/2012    Procedure: OPEN REDUCTION INTERNAL FIXATION (ORIF) ANKLE FRACTURE;  Surgeon: Kerin Salen, MD;  Location: [facility];  Service: Orthopedics;  Laterality: Left;  . Colonoscopy    . Tonsillectomy      as a child  . I&d extremity Right 11/26/2014    Procedure: INCISION AND DRAINAGE OF RIGHT INDEX FINGER;  Surgeon: Charlotte Crumb, MD;  Location: [facility];  Service: Orthopedics;  Laterality: Right;    Social History   Social History  . Marital Status: Married    Spouse Name: N/A  . Number of Children: 2  . Years of Education: N/A   Occupational History  . Not on file.   Social History Main Topics  . Smoking status: Never Smoker   . Smokeless tobacco: Never Used  . Alcohol Use: 0.0 oz/week    0 Standard drinks or equivalent per week     Comment: 6 to 12 beers per week  . Drug Use: No  .  Sexual Activity: Not on file   Other Topics Concern  . Not on file   Social History Narrative    Family History  Problem Relation Age of Onset  . Colon cancer Neg Hx   . CAD      No family history    ROS:  Patient with small abscess on right hand causing pain but no fevers or chills, productive cough, hemoptysis, dysphasia, odynophagia, melena, hematochezia, dysuria, hematuria, rash, seizure activity, orthopnea, PND, pedal edema, claudication. Remaining systems are negative.  Physical Exam:   Blood pressure 134/100, pulse 75, temperature 97.7 F (36.5 C), temperature source Oral, resp. rate 20, SpO2 98 %.  General:  Well developed/well nourished in NAD Skin warm/dry Patient not depressed No peripheral clubbing Back-normal HEENT-normal/normal eyelids Neck supple/normal carotid upstroke bilaterally; no bruits; no JVD; no thyromegaly chest - CTA/ normal expansion CV - RRR/normal S1 and S2; no murmurs, rubs or gallops;  PMI nondisplaced Abdomen -NT/ND, no HSM, no mass, + bowel sounds, no bruit 2+ femoral pulses, no bruits Ext-no edema, chords, 2+ DP; Dorsal aspect of right hand with small probable abscess. Erythema and tenderness noted. Some pain in distal hand with palpation. Neuro-grossly nonfocal  ECG NSR, no ST changes  Results for orders placed or performed during the hospital encounter of 11/29/15 (from the past  48 hour(s))  Basic metabolic panel     Status: Abnormal   Collection Time: 11/29/15 11:58 AM  Result Value Ref Range   Sodium 139 135 - 145 mmol/L   Potassium 4.2 3.5 - 5.1 mmol/L   Chloride 101 101 - 111 mmol/L   CO2 26 22 - 32 mmol/L   Glucose, Bld 240 (H) 65 - 99 mg/dL   BUN 10 6 - 20 mg/dL   Creatinine, Ser 0.73 0.61 - 1.24 mg/dL   Calcium 8.9 8.9 - 10.3 mg/dL   GFR calc non Af Amer >60 >60 mL/min   GFR calc Af Amer >60 >60 mL/min    Comment: (NOTE) The eGFR has been calculated using the CKD EPI equation. This calculation has not been validated in  all clinical situations. eGFR's persistently <60 mL/min signify possible Chronic Kidney Disease.    Anion gap 12 5 - 15  CBC     Status: None   Collection Time: 11/29/15 11:58 AM  Result Value Ref Range   WBC 6.1 4.0 - 10.5 K/uL   RBC 5.12 4.22 - 5.81 MIL/uL   Hemoglobin 16.9 13.0 - 17.0 g/dL   HCT 47.2 39.0 - 52.0 %   MCV 92.2 78.0 - 100.0 fL   MCH 33.0 26.0 - 34.0 pg   MCHC 35.8 30.0 - 36.0 g/dL   RDW 12.9 11.5 - 15.5 %   Platelets 153 150 - 400 K/uL  I-stat troponin, ED (not at [facility], [facility])     Status: None   Collection Time: 11/29/15 12:05 PM  Result Value Ref Range   Troponin i, poc 0.00 0.00 - 0.08 ng/mL   Comment 3            Comment: Due to the release kinetics of cTnI, a negative result within the first hours of the onset of symptoms does not rule out myocardial infarction with certainty. If myocardial infarction is still suspected, repeat the test at appropriate intervals.     No results found.  Assessment/Plan 1 unstable angina-patient's symptoms are concerning. Presently pain free. Plan to admit and cycle enzymes. Treat with aspirin, heparin, statin and beta blockade. Proceed with cardiac catheterization tomorrow. Sooner if symptoms return. Risks and benefits were discussed and patient agrees to proceed. 2 diabetes mellitus-follow CBGs. Hold Glucophage now and for 48 hours following catheterization. 3 hyperlipidemia-continue statin. 4 small abscess right hand-this has developed over the past 1 week. He was placed on Keflex by his primary care physician 2 days PTA but it has continued and worsened. We will continue Keflex for now and ask surgery to see. Likely needs I and D.  Kirk Ruths MD 11/29/2015, 1:32 PM

## 2015-11-29 NOTE — ED Notes (Signed)
Pt has a red swollen area to right hand-- being treated with antibiotics since Friday.

## 2015-11-30 ENCOUNTER — Encounter (HOSPITAL_COMMUNITY): Payer: Self-pay | Admitting: Cardiovascular Disease

## 2015-11-30 ENCOUNTER — Inpatient Hospital Stay (HOSPITAL_COMMUNITY): Payer: BLUE CROSS/BLUE SHIELD

## 2015-11-30 ENCOUNTER — Encounter (HOSPITAL_COMMUNITY)
Admission: EM | Disposition: A | Discharge status: Home / Self Care | Payer: Self-pay | Source: Home / Self Care | Attending: Cardiology

## 2015-11-30 DIAGNOSIS — I25110 Atherosclerotic heart disease of native coronary artery with unstable angina pectoris: Secondary | ICD-10-CM | POA: Insufficient documentation

## 2015-11-30 DIAGNOSIS — E119 Type 2 diabetes mellitus without complications: Secondary | ICD-10-CM

## 2015-11-30 DIAGNOSIS — R079 Chest pain, unspecified: Secondary | ICD-10-CM

## 2015-11-30 HISTORY — PX: CARDIAC CATHETERIZATION: SHX172

## 2015-11-30 LAB — CBC WITH DIFFERENTIAL/PLATELET
BASOS PCT: 0 %
Basophils Absolute: 0 10*3/uL (ref 0.0–0.1)
Eosinophils Absolute: 0.1 10*3/uL (ref 0.0–0.7)
Eosinophils Relative: 1 %
HEMATOCRIT: 43.6 % (ref 39.0–52.0)
HEMOGLOBIN: 15.2 g/dL (ref 13.0–17.0)
LYMPHS ABS: 1.8 10*3/uL (ref 0.7–4.0)
LYMPHS PCT: 35 %
MCH: 32 pg (ref 26.0–34.0)
MCHC: 34.9 g/dL (ref 30.0–36.0)
MCV: 91.8 fL (ref 78.0–100.0)
MONOS PCT: 7 %
Monocytes Absolute: 0.4 10*3/uL (ref 0.1–1.0)
NEUTROS ABS: 2.9 10*3/uL (ref 1.7–7.7)
NEUTROS PCT: 57 %
Platelets: 154 10*3/uL (ref 150–400)
RBC: 4.75 MIL/uL (ref 4.22–5.81)
RDW: 13 % (ref 11.5–15.5)
WBC: 5.2 10*3/uL (ref 4.0–10.5)

## 2015-11-30 LAB — GLUCOSE, CAPILLARY
Glucose-Capillary: 207 mg/dL — ABNORMAL HIGH (ref 65–99)
Glucose-Capillary: 185 mg/dL — ABNORMAL HIGH (ref 65–99)
Glucose-Capillary: 206 mg/dL — ABNORMAL HIGH (ref 65–99)
Glucose-Capillary: 207 mg/dL — ABNORMAL HIGH (ref 65–99)

## 2015-11-30 LAB — LIPID PANEL
CHOL/HDL RATIO: 4.2 ratio
Cholesterol: 126 mg/dL (ref 0–200)
HDL: 30 mg/dL — AB (ref >40)
LDL CALC: 79 mg/dL (ref 0–99)
TRIGLYCERIDES: 84 mg/dL (ref <150)
VLDL: 17 mg/dL (ref 0–40)

## 2015-11-30 LAB — COMPREHENSIVE METABOLIC PANEL
ALT: 63 U/L (ref 17–63)
ANION GAP: 9 (ref 5–15)
AST: 27 U/L (ref 15–41)
Albumin: 3.6 g/dL (ref 3.5–5.0)
Alkaline Phosphatase: 98 U/L (ref 38–126)
BUN: 10 mg/dL (ref 6–20)
CHLORIDE: 104 mmol/L (ref 101–111)
CO2: 28 mmol/L (ref 22–32)
Calcium: 8.6 mg/dL — ABNORMAL LOW (ref 8.9–10.3)
Creatinine, Ser: 0.66 mg/dL (ref 0.61–1.24)
GFR calc Af Amer: >60 mL/min (ref >60)
GFR calc non Af Amer: >60 mL/min (ref >60)
GLUCOSE: 239 mg/dL — AB (ref 65–99)
POTASSIUM: 3.8 mmol/L (ref 3.5–5.1)
SODIUM: 141 mmol/L (ref 135–145)
TOTAL PROTEIN: 6.1 g/dL — AB (ref 6.5–8.1)
Total Bilirubin: 0.9 mg/dL (ref 0.3–1.2)

## 2015-11-30 LAB — PROTIME-INR
INR: 1.26 (ref 0.00–1.49)
Prothrombin Time: 16 seconds — ABNORMAL HIGH (ref 11.6–15.2)

## 2015-11-30 LAB — HEPARIN LEVEL (UNFRACTIONATED): HEPARIN UNFRACTIONATED: 0.23 [IU]/mL — AB (ref 0.30–0.70)

## 2015-11-30 LAB — TROPONIN I: Troponin I: <0.03 ng/mL (ref <0.031)

## 2015-11-30 LAB — TSH: TSH: 2.06 u[IU]/mL (ref 0.350–4.500)

## 2015-11-30 SURGERY — LEFT HEART CATH AND CORONARY ANGIOGRAPHY
Anesthesia: LOCAL

## 2015-11-30 MED ORDER — FENTANYL CITRATE (PF) 100 MCG/2ML IJ SOLN
50.0000 ug | Freq: Once | INTRAMUSCULAR | Status: AC
  Administered 2015-11-30: 25 ug via INTRAVENOUS
  Filled 2015-11-30: qty 2

## 2015-11-30 MED ORDER — SODIUM CHLORIDE 0.9 % IJ SOLN: 3.0000 mL | INTRAMUSCULAR | Status: DC | PRN

## 2015-11-30 MED ORDER — HEPARIN SODIUM (PORCINE) 1000 UNIT/ML IJ SOLN
INTRAMUSCULAR | Status: AC
  Filled 2015-11-30: qty 1

## 2015-11-30 MED ORDER — FENTANYL CITRATE (PF) 100 MCG/2ML IJ SOLN
INTRAMUSCULAR | Status: DC | PRN
  Administered 2015-11-30: 50 ug via INTRAVENOUS

## 2015-11-30 MED ORDER — VERAPAMIL HCL 2.5 MG/ML IV SOLN
INTRAVENOUS | Status: DC | PRN
  Administered 2015-11-30: 10 mL via INTRA_ARTERIAL

## 2015-11-30 MED ORDER — FENTANYL CITRATE (PF) 100 MCG/2ML IJ SOLN
INTRAMUSCULAR | Status: AC
  Filled 2015-11-30: qty 2

## 2015-11-30 MED ORDER — MIDAZOLAM HCL 2 MG/2ML IJ SOLN
INTRAMUSCULAR | Status: DC | PRN
  Administered 2015-11-30: 2 mg via INTRAVENOUS

## 2015-11-30 MED ORDER — HEPARIN SODIUM (PORCINE) 1000 UNIT/ML IJ SOLN
INTRAMUSCULAR | Status: DC | PRN
  Administered 2015-11-30: 6000 [IU] via INTRAVENOUS

## 2015-11-30 MED ORDER — LIDOCAINE HCL (PF) 1 % IJ SOLN
INTRAMUSCULAR | Status: AC
  Filled 2015-11-30: qty 30

## 2015-11-30 MED ORDER — METOPROLOL TARTRATE 25 MG PO TABS: 25.0000 mg | ORAL_TABLET | Freq: Two times a day (BID) | ORAL | Status: DC

## 2015-11-30 MED ORDER — MIDAZOLAM HCL 2 MG/2ML IJ SOLN
INTRAMUSCULAR | Status: AC
  Filled 2015-11-30: qty 2

## 2015-11-30 MED ORDER — METOPROLOL TARTRATE 25 MG PO TABS
25.0000 mg | ORAL_TABLET | Freq: Once | ORAL | Status: AC
  Administered 2015-11-30: 25 mg via ORAL

## 2015-11-30 MED ORDER — SODIUM CHLORIDE 0.9 % IJ SOLN
3.0000 mL | Freq: Two times a day (BID) | INTRAMUSCULAR | Status: DC
  Administered 2015-12-01: 3 mL via INTRAVENOUS

## 2015-11-30 MED ORDER — SODIUM CHLORIDE 0.9 % IV SOLN: 250.0000 mL | INTRAVENOUS | Status: DC | PRN

## 2015-11-30 MED ORDER — SODIUM CHLORIDE 0.9 % IV SOLN: INTRAVENOUS | Status: AC

## 2015-11-30 MED ORDER — ISOSORBIDE MONONITRATE ER 30 MG PO TB24
30.0000 mg | ORAL_TABLET | Freq: Every day | ORAL | Status: DC
  Administered 2015-11-30 – 2015-12-01 (×2): 30 mg via ORAL
  Filled 2015-11-30 (×2): qty 1

## 2015-11-30 MED ORDER — VERAPAMIL HCL 2.5 MG/ML IV SOLN
INTRAVENOUS | Status: AC
  Filled 2015-11-30: qty 2

## 2015-11-30 MED ORDER — HEPARIN (PORCINE) IN NACL 2-0.9 UNIT/ML-% IJ SOLN
INTRAMUSCULAR | Status: AC
  Filled 2015-11-30: qty 1000

## 2015-11-30 MED ORDER — HEPARIN (PORCINE) IN NACL 2-0.9 UNIT/ML-% IJ SOLN
INTRAMUSCULAR | Status: DC | PRN
  Administered 2015-11-30: 11:00:00

## 2015-11-30 MED ORDER — IOHEXOL 350 MG/ML SOLN
INTRAVENOUS | Status: DC | PRN
  Administered 2015-11-30: 100 mL via INTRAVENOUS

## 2015-11-30 SURGICAL SUPPLY — 12 items
CATH INFINITI 5FR ANG PIGTAIL (CATHETERS) ×2 IMPLANT
CATH INFINITI 5FR JL4 (CATHETERS) ×2 IMPLANT
CATH INFINITI JR4 5F (CATHETERS) ×2 IMPLANT
DEVICE RAD COMP TR BAND LRG (VASCULAR PRODUCTS) ×2 IMPLANT
GLIDESHEATH SLEND SS 6F .021 (SHEATH) ×2 IMPLANT
HOVERMATT SINGLE USE (MISCELLANEOUS) ×2 IMPLANT
KIT HEART LEFT (KITS) ×2 IMPLANT
PACK CARDIAC CATHETERIZATION (CUSTOM PROCEDURE TRAY) ×2 IMPLANT
SYR MEDRAD MARK V 150ML (SYRINGE) ×2 IMPLANT
TRANSDUCER W/STOPCOCK (MISCELLANEOUS) ×2 IMPLANT
TUBING CIL FLEX 10 FLL-RA (TUBING) ×2 IMPLANT
WIRE SAFE-T 1.5MM-J .035X260CM (WIRE) ×2 IMPLANT

## 2015-11-30 NOTE — Progress Notes (Signed)
UR Completed Isami Mehra Graves-Bigelow, RN,BSN [phone]  

## 2015-11-30 NOTE — Progress Notes (Signed)
ANTICOAGULATION CONSULT NOTE - Follow Up Consult  Pharmacy Consult for Heparin  Indication: chest pain/ACS  No Known Allergies  Patient Measurements: Height: 6\' 1"  (185.4 cm) Weight: 264 lb 15.9 oz (120.2 kg) IBW/kg (Calculated) : 79.9  Vital Signs: Temp: 98.3 F (36.8 C) (01/08 1932) Temp Source: Oral (01/08 1932) BP: 139/96 mmHg (01/08 2250) Pulse Rate: 74 (01/08 2250)  Labs:  Recent Labs  11/29/15 1158 11/30/15 0207  HGB 16.9 15.2  HCT 47.2 43.6  PLT 153 154  LABPROT  --  16.0*  INR  --  1.26  HEPARINUNFRC  --  0.23*  CREATININE 0.73 0.66    Estimated Creatinine Clearance: 143.3 mL/min (by C-G formula based on Cr of 0.66).  Assessment: Initial heparin level sub-therapeutic, no issues per RN.   Goal of Therapy:  Heparin level 0.3-0.7 units/ml Monitor platelets by anticoagulation protocol: Yes   Plan:  -Increase heparin drip to 1600 units/hr -1100 HL  Parth Mccormac 11/30/2015,3:52 AM

## 2015-11-30 NOTE — Progress Notes (Signed)
  Echocardiogram 2D Echocardiogram has been performed.  Jacob Downs, Jacob Downs 11/30/2015, 1:55 PM

## 2015-11-30 NOTE — H&P (View-Only) (Signed)
    Subjective: He had some chest pain when he got up to go to the bathroom.  It resolved with laying back down.    Objective: Vital signs in last 24 hours: Temp:  [97.7 F (36.5 C)-98.3 F (36.8 C)] 98.1 F (36.7 C) (01/09 0405) Pulse Rate:  [64-76] 64 (01/09 0405) Resp:  [13-20] 20 (01/09 0405) BP: (111-151)/(63-101) 111/63 mmHg (01/09 0405) SpO2:  [95 %-99 %] 95 % (01/09 0405) Weight:  [253 lb 15.5 oz (115.2 kg)-264 lb 15.9 oz (120.2 kg)] 253 lb 15.5 oz (115.2 kg) (01/09 0640) Last BM Date: 11/28/15  Intake/Output from previous day: 01/08 0701 - 01/09 0700 In: 681.8 [P.O.:170; I.V.:511.8] Out: 1650 [Urine:1650] Intake/Output this shift:    Medications Scheduled Meds: . aspirin  324 mg Oral NOW   Or  . aspirin  300 mg Rectal NOW  . aspirin EC  81 mg Oral Daily  . atorvastatin  80 mg Oral QPM  . doxycycline  100 mg Oral Q12H  . famotidine  20 mg Oral BID  . insulin aspart  0-5 Units Subcutaneous QHS  . insulin aspart  0-9 Units Subcutaneous TID WC  . metoprolol tartrate  25 mg Oral BID  . sodium chloride  3 mL Intravenous Q12H  . sodium chloride  3 mL Intravenous Q12H   Continuous Infusions: . sodium chloride 1 mL/kg/hr (11/30/15 0654)  . heparin 1,600 Units/hr (11/30/15 0600)   PRN Meds:.sodium chloride, sodium chloride, acetaminophen, nitroGLYCERIN, ondansetron (ZOFRAN) IV, sodium chloride, sodium chloride  PE: General appearance: alert, cooperative and no distress Lungs: clear to auscultation bilaterally Heart: regular rate and rhythm, S1, S2 normal, no murmur, click, rub or gallop Extremities: No LEE Pulses: 2+ and symmetric Skin: Warm and dry.  He has an abcess on his right wrist which was drained in the er.  Currently wrapped.  Neurologic: Grossly normal  Lab Results:   Recent Labs  11/29/15 1158 11/30/15 0207  WBC 6.1 5.2  HGB 16.9 15.2  HCT 47.2 43.6  PLT 153 154   BMET  Recent Labs  11/29/15 1158 11/30/15 0207  NA 139 141  K 4.2 3.8    CL 101 104  CO2 26 28  GLUCOSE 240* 239*  BUN 10 10  CREATININE 0.73 0.66  CALCIUM 8.9 8.6*   PT/INR  Recent Labs  11/30/15 0207  LABPROT 16.0*  INR 1.26    Assessment/Plan  Active Problems:   Unstable angina (HCC)   DM   HLD   Skin abscess, right wrist.   Troponin negative x2.   Left heart cath today at 1500hrs. Will need right groin approach.   ASA, IV heparin, lipitor 80, metoprolol 25 bid.    Glucose poorly controlled. A1C pending. On sliding scale novolog.  Only on metformin at home.  Will have diabetes coor. consult.  Check lipid panel. I&D performed in ER on wrist abscess.  On doxycycline.  Dr. Janee Mornhompson is supposed to see today.    LOS: 1 day    Downs, BRYAN PA-C 11/30/2015 7:48 AM   Agree with note written by Jones SkeneBryan Hagar [facility]  Admitted with good H/[place]. + CRF. Enz neg. EKG w/o acute changes. Labs OK. For cath today.   Jacob Downs, Jacob Downs 11/30/2015 9:21 AM   \

## 2015-11-30 NOTE — Research (Signed)
CADLAD Informed Consent   Subject Name: Jacob Downs  Subject met inclusion and exclusion criteria.  The informed consent form, study requirements and expectations were reviewed with the subject and questions and concerns were addressed prior to the signing of the consent form.  The subject verbalized understanding of the trail requirements.  The subject agreed to participate in the CADLAD trial and signed the informed consent.  The informed consent was obtained prior to performance of any protocol-specific procedures for the subject.  A copy of the signed informed consent was given to the subject and a copy was placed in the subject's medical record.  Hugh Pruitt Jr. 11/30/2015, 0905 am  

## 2015-11-30 NOTE — Progress Notes (Signed)
Consent form signed for Cardiac Catheterization. Pt and his wife watched the video and  I answered their questions. Pt apprehensive about the procedure but he is  very aware how important it is.

## 2015-11-30 NOTE — Progress Notes (Signed)
    Subjective: He had some chest pain when he got up to go to the bathroom.  It resolved with laying back down.    Objective: Vital signs in last 24 hours: Temp:  [97.7 F (36.5 C)-98.3 F (36.8 C)] 98.1 F (36.7 C) (01/09 0405) Pulse Rate:  [64-76] 64 (01/09 0405) Resp:  [13-20] 20 (01/09 0405) BP: (111-151)/(63-101) 111/63 mmHg (01/09 0405) SpO2:  [95 %-99 %] 95 % (01/09 0405) Weight:  [253 lb 15.5 oz (115.2 kg)-264 lb 15.9 oz (120.2 kg)] 253 lb 15.5 oz (115.2 kg) (01/09 0640) Last BM Date: 11/28/15  Intake/Output from previous day: 01/08 0701 - 01/09 0700 In: 681.8 [P.O.:170; I.V.:511.8] Out: 1650 [Urine:1650] Intake/Output this shift:    Medications Scheduled Meds: . aspirin  324 mg Oral NOW   Or  . aspirin  300 mg Rectal NOW  . aspirin EC  81 mg Oral Daily  . atorvastatin  80 mg Oral QPM  . doxycycline  100 mg Oral Q12H  . famotidine  20 mg Oral BID  . insulin aspart  0-5 Units Subcutaneous QHS  . insulin aspart  0-9 Units Subcutaneous TID WC  . metoprolol tartrate  25 mg Oral BID  . sodium chloride  3 mL Intravenous Q12H  . sodium chloride  3 mL Intravenous Q12H   Continuous Infusions: . sodium chloride 1 mL/kg/hr (11/30/15 0654)  . heparin 1,600 Units/hr (11/30/15 0600)   PRN Meds:.sodium chloride, sodium chloride, acetaminophen, nitroGLYCERIN, ondansetron (ZOFRAN) IV, sodium chloride, sodium chloride  PE: General appearance: alert, cooperative and no distress Lungs: clear to auscultation bilaterally Heart: regular rate and rhythm, S1, S2 normal, no murmur, click, rub or gallop Extremities: No LEE Pulses: 2+ and symmetric Skin: Warm and dry.  He has an abcess on his right wrist which was drained in the er.  Currently wrapped.  Neurologic: Grossly normal  Lab Results:   Recent Labs  11/29/15 1158 11/30/15 0207  WBC 6.1 5.2  HGB 16.9 15.2  HCT 47.2 43.6  PLT 153 154   BMET  Recent Labs  11/29/15 1158 11/30/15 0207  NA 139 141  K 4.2 3.8    CL 101 104  CO2 26 28  GLUCOSE 240* 239*  BUN 10 10  CREATININE 0.73 0.66  CALCIUM 8.9 8.6*   PT/INR  Recent Labs  11/30/15 0207  LABPROT 16.0*  INR 1.26    Assessment/Plan  Active Problems:   Unstable angina (HCC)   DM   HLD   Skin abscess, right wrist.   Troponin negative x2.   Left heart cath today at 1500hrs. Will need right groin approach.   ASA, IV heparin, lipitor 80, metoprolol 25 bid.    Glucose poorly controlled. A1C pending. On sliding scale novolog.  Only on metformin at home.  Will have diabetes coor. consult.  Check lipid panel. I&D performed in ER on wrist abscess.  On doxycycline.  Dr. Thompson is supposed to see today.    LOS: 1 day    Jacob Downs, Jacob Downs 11/30/2015 7:48 AM   Agree with note written by Jacob Hagar PAC  Admitted with good H/O [place]. + CRF. Enz neg. EKG w/o acute changes. Labs OK. For cath today.   Jacob Downs, Jacob Downs 11/30/2015 9:21 AM   \  

## 2015-11-30 NOTE — Interval H&P Note (Signed)
History and Physical Interval Note:  11/30/2015 10:09 AM  Jacob SilvanSteven R Quant  has presented today for cardiac cath with the diagnosis of unstable angina. The various methods of treatment have been discussed with the patient and family. After consideration of risks, benefits and other options for treatment, the patient has consented to  Procedure(s): Left Heart Cath and Coronary Angiography (N/A) as a surgical intervention .  The patient's history has been reviewed, patient examined, no change in status, stable for surgery.  I have reviewed the patient's chart and labs.  Questions were answered to the patient's satisfaction.    Cath Lab Visit (complete for each Cath Lab visit)  Clinical Evaluation Leading to the Procedure:   ACS: No.  Non-ACS:    Anginal Classification: CCS III  Anti-ischemic medical therapy: No Therapy  Non-Invasive Test Results: No non-invasive testing performed  Prior CABG: No previous CABG         Ajdin Macke

## 2015-11-30 NOTE — Care Management Note (Addendum)
Case Management Note  Patient Details  Name: Jacob Downs MRN: [id number] Date of Birth: July 01, 1961  Subjective/Objective:  Pt admitted for Unstable Angina. Plan for Cardiac Cath 11-30-15.                   Action/Plan: No needs identified by CM at this time. Will continue to monitor.    Expected Discharge Date:                  Expected Discharge Plan:  Home/Self Care  In-House Referral:  NA  Discharge planning Services  CM Consult  Post Acute Care Choice:    Choice offered to:     DME Arranged:    DME Agency:     HH Arranged:    HH Agency:     Status of Service:  In process, will continue to follow  Medicare Important Message Given:    Date Medicare IM Given:    Medicare IM give by:    Date Additional Medicare IM Given:    Additional Medicare Important Message give by:     If discussed at Long Length of Stay Meetings, dates discussed:    Additional Comments:  Gala LewandowskyGraves-Bigelow, Quincee Gittens Kaye, RN 11/30/2015, 11:41 AM

## 2015-11-30 NOTE — Progress Notes (Addendum)
Inpatient Diabetes Program Recommendations  AACE/ADA: New Consensus Statement on Inpatient Glycemic Control (2015)  Target Ranges:  Prepandial:   less than 140 mg/dL      Peak postprandial:   less than 180 mg/dL (1-2 hours)      Critically ill patients:  140 - 180 mg/dL  Results for Jacob Downs, Jacob Downs (MRN [id number]) as of 11/30/2015 08:35  Ref. Range 11/29/2015 17:46 11/29/2015 21:01 11/30/2015 07:47  Glucose-Capillary Latest Ref Range: 65-99 mg/dL 621211 (H) 308278 (H) 657207 (H)   Review of Glycemic Control  Diabetes history: DM2 Outpatient Diabetes medications: Metformin 1000 mg QAM, Metformin 500 mg QPM Current orders for Inpatient glycemic control: Novolog 0-9 units TID with meals, Novolog 0-5 units HS  Inpatient Diabetes Program Recommendations: Correction (SSI): Patient is currently NPO. Please increase Novolog correction to Moderate scale and change to Q4H while NPO (once diet is resumed, please change frequency to ACHS). A1C: Current A1C is in process.  11/30/15@11 :38-Spoke with patient about diabetes and home regimen for diabetes control. Patient reports that he is followed by his PCP for diabetes management and currently he takes Metformin 1000 mg QAM and Metformin 500 mg QPM as an outpatient for diabetes control.  Inquired about knowledge about A1C and patient reports that he knowd what an A1C is but does not recall his exact A1C value but reports that his A1C was less than 7%. Informed patient that a current A1C has been ordered and is currently in process. Encouraged patient to ask nursing staff about A1C results if he is not informed of results before discharge. Patient states that he has a glucometer at home but he does not check his glucose as an outpatient. Discussed importance of checking CBGs and maintaining good CBG control to prevent long-term and short-term complications. Discussed impact of nutrition, exercise, stress, sickness, and medications on diabetes control. Patient states that he  was working out at the gym up until about 6 months ago and he recently started working out again. Patient states that he plans to continue working out at the gym if he is given permission to do so by the cardiologist. Encouraged patient to check glucose at least once a day as an outpatient and take his meter with him to follow up appointments so his PCP can make adjustments with his DM medications if needed.   Patient verbalized understanding of information discussed and he states that he has no further questions at this time related to diabetes.   Thanks, Orlando PennerMarie Calder Oblinger, RN, MSN, CDE Diabetes Coordinator Inpatient Diabetes Program [phone] (Team Pager from 8am to 5pm) [phone] ([facility] office) [phone] [facility] office) [phone] [facility] office)

## 2015-11-30 NOTE — Progress Notes (Signed)
Iv occulded/clotted upon arrival

## 2015-11-30 NOTE — Discharge Summary (Signed)
Discharge Summary   Patient ID: Jacob Downs,  MRN: [id number], DOB/AGE: Nov 28, 1960 55 y.o.  Admit date: 11/29/2015 Discharge date: 12/01/2015  Primary Care Provider: Mickie Hillier Primary Cardiologist: Dr. Jens Som ([place])  Discharge Diagnoses    Unstable angina [facility])   Coronary artery disease involving native coronary artery of native heart with unstable angina pectoris (HCC)   DM   HL   Abscess in the subcutaneous tissues of the right dorsal hand.   Sinus Bradycardia    Allergies No Known Allergies  Consultant: Diabetes Coordinator and Orthopaedic & Hand Surgery (In ED)  Procedures  Procedures    Left Heart Cath and Coronary Angiography 11/30/2015    Conclusion    1. Severe single vessel CAD 2. The LAD is moderate in caliber proximally and is heavily calcified. The vessel quickly becomes small in caliber beyond the takeoff of the first diagonal branch. There is a 99% stenosis in the mid LAD just beyond the diagonal branch. The vessel is very small and diffusely diseased beyond the severe stenosis. The LAD is too small for PCI/stenting. The Diagonal branch is small in caliber with moderate proximal stenosis.  3. The RCA and Circumflex has mild non-obstructive disease.  4. Mild segmental LV systolic dysfunction.   Recommendations: His LAD is the likely culprit but it is a small, diffusely diseased vessel and not a favorable target for PCI/stenting. Will recommend medical management. Will start Imdur. Continue ASA, beta blocker and statin. Will arrange echo today.    Coronary Findings    Dominance: Right   Left Anterior Descending  . Vessel is moderate in size.   Suezanne Jacquet LAD to Prox LAD lesion, 20% stenosed. Calcified.   . Prox LAD lesion, 99% stenosed. Discrete.   . Mid LAD lesion, 70% stenosed. Diffuse.   . First Diagonal Branch   The vessel is small in size.   . 1st Diag lesion, 40% stenosed. Diffuse.     Ramus Intermedius  . Vessel is small.      Left Circumflex   . First Obtuse Marginal Branch   The vessel is moderate in size.   . 1st Mrg lesion, 20% stenosed. Diffuse.   . Second Obtuse Marginal Branch   The vessel is moderate in size.   . 2nd Mrg lesion, 20% stenosed. Diffuse.   . Third Obtuse Marginal Branch   The vessel is small in size.     Right Coronary Artery  . Vessel is large.   . Prox RCA to Dist RCA lesion, 20% stenosed. Diffuse.   . Right Posterior Descending Artery   The vessel is moderate in size.   . Right Posterior Atrioventricular Branch   The vessel is small in size.   Marland Kitchen Post Atrio lesion, 30% stenosed. Diffuse.      Wall Motion                 Left Heart    Left Ventricle The left ventricular size is normal. There is mild left ventricular systolic dysfunction. The left ventricular ejection fraction is 45-50% by visual estimate. There are wall motion abnormalities in the left ventricle. There are segmental wall motion abnormalities in the left ventricle.    Coronary Diagrams    Diagnostic Diagram              History of Present Illness  55 year old male with past medical history of diabetes mellitus and hyperlipidemia admitted 11/29/15 with unstable angina. No prior cardiac history. Over the past 1 week  the patient noticed initially that he was having chest pain on the treadmill that would improve with slowing down. The pain was in the left breast area without radiation. No associated symptoms. Described as a dull sensation. The pain has occurred with less activity through the week. He had more severe pain after climbing stairs at home last evening. It lasted for 8-10 minutes after stopping. He is presently pain-free. He denies dyspnea, orthopnea, PND or pedal edema. No syncope.  He had small abscess right hand over the past 1 week. He was placed on Keflex by his primary care physician 2 days PTA but it has continued and worsened. I&D performed in ED with irrigation and packing. Seen by  Dr. Janee Morn  (Orthopaedic & Hand Surgery) in ED and placed on doxycycline x 10-14 days (DC keflex) , advised routine wound care and f/u as needed.   Hospital Course  The patient was admitted with symptoms concerning for unstable angina. Held Glucophage now and for 48 hours following catheterization. Started IV heparin. The patient continues to have cp with minimal exertion. Cath Imdur and BB added to regimen. Cath showed 90% stenosed pro LAD and 70% stenosed mLAD, likely culprit but it is a small, diffusely diseased vessel and not a favorable target for PCI/stenting. Recommend medical management. The RCA and Circumflex has mild non-obstructive disease. Mild segmental LV systolic dysfunction with EF of 45-50%. Echocardiogram showed Lv EF of 50-55%, grade 1 DD, RVEF is normal to mildly reduced. Due to sinus bradycardia dose decreased to Metoprolol 12.5mg  BID. The patient is asymptomatic. HR mostly in 50s with transient rate of mid 40s.   She has been seen by Dr. Allyson Sabal today and deemed ready for discharge home. All follow-up appointments have been scheduled. Discharge medications are listed below.   Plan to add Ranexa as outpatient if continues to have CP during office visit. Consider OP MV to assess ischemia in the LAD territory as outpatient. He will closely f/u with PCP for A1c of 9.6. Hold metformin for 48 hours post cath. He will finished doxycycline course for 10 days and f/u with Dr. Janee Morn as needed, patient aware. Will increase his Lipitor to 40mg  qd and recheck LFT in 4-6 weeks. He does heavy lifting at work (about 90 lb), will give work note until seen in clinic.    Discharge Vitals Blood pressure 114/79, pulse 68, temperature 98 F (36.7 C), temperature source Oral, resp. rate 18, height 6\' 1"  (1.854 m), weight 257 lb 8 oz (116.8 kg), SpO2 98 %.  Filed Weights   11/29/15 1742 11/30/15 0640 12/01/15 0550  Weight: 264 lb 15.9 oz (120.2 kg) 253 lb 15.5 oz (115.2 kg) 257 lb 8 oz (116.8 kg)      Labs  CBC  Recent Labs  11/30/15 0207 12/01/15 0411  WBC 5.2 4.1  NEUTROABS 2.9  --   HGB 15.2 14.3  HCT 43.6 42.4  MCV 91.8 94.0  PLT 154 140*   Basic Metabolic Panel  Recent Labs  11/30/15 0207 12/01/15 0411  NA 141 141  K 3.8 4.8  CL 104 104  CO2 28 31  GLUCOSE 239* 236*  BUN 10 10  CREATININE 0.66 0.94  CALCIUM 8.6* 8.6*   Liver Function Tests  Recent Labs  11/30/15 0207  AST 27  ALT 63  ALKPHOS 98  BILITOT 0.9  PROT 6.1*  ALBUMIN 3.6   Cardiac Enzymes  Recent Labs  11/30/15 0237  TROPONINI <0.03   Fasting Lipid Panel  Recent  Labs  11/30/15 0237  CHOL 126  HDL 30*  LDLCALC 79  TRIG 84  CHOLHDL 4.2   Thyroid Function Tests  Recent Labs  11/30/15 0207  TSH 2.060    Disposition  Pt is being discharged home today in good condition.  Follow-up Plans & Appointments  Follow-up Information    Follow up with [facility]. Go on 12/10/2015.   Specialty:  Cardiology   Why:   @8am  for post hospital Darrol Jumphonda G Barrett, PA-C   Contact information:   [address] [place] [id number] [phone]      Follow up with Jodi MarbleHOMPSON, DAVID A., MD.   Specialty:  Orthopedic Surgery   Why:  As needed for R hand abscess   Contact information:   [address] [place] [address] [phone]       Follow up with Mickie HillierLITTLE,KEVIN LORNE, MD. Schedule an appointment as soon as possible for a visit in 1 week.   Specialty:  Family Medicine   Why:  for DM managment   Contact information:   [address] [place] [address] [phone]           Discharge Instructions    Call MD for:  redness, tenderness, or signs of infection (pain, swelling, redness, odor or green/yellow discharge around incision site)    Complete by:  As directed      Diet - low sodium heart healthy    Complete by:  As directed      Discharge instructions    Complete by:  As directed   No driving for 48 hours. No lifting  over 5 lbs for 1 week. No sexual activity for 1 week. You may return to work on 11/02/16. Keep procedure site clean & dry. If you notice increased pain, swelling, bleeding or pus, call/return!  You may shower, but no soaking baths/hot tubs/pools for 1 week.   Hold metformin until 12/02/15, Resume 12/03/15.     Increase activity slowly    Complete by:  As directed          F/u Labs/Studies: Consider OP f/u labs 6-8 weeks given statin increased dose this admission.  Discharge Medications    Medication List    TAKE these medications        aspirin 81 MG tablet  Take 81 mg by mouth daily.     atorvastatin 40 MG tablet  Commonly known as:  LIPITOR  Take 0.5 tablets (20 mg total) by mouth every evening.     doxycycline 100 MG tablet  Commonly known as:  VIBRA-TABS  Take 1 tablet (100 mg total) by mouth every 12 (twelve) hours.     famotidine 20 MG tablet  Commonly known as:  PEPCID  Take 20 mg by mouth as needed for heartburn.     isosorbide mononitrate 30 MG 24 hr tablet  Commonly known as:  IMDUR  Take 1 tablet (30 mg total) by mouth daily.     metFORMIN 500 MG tablet  Commonly known as:  GLUCOPHAGE  Take 500-1,000 mg by mouth 2 (two) times daily with a meal. Take 2 tablets in the morning Take 1 tablet in the evening     metoprolol tartrate 25 MG tablet  Commonly known as:  LOPRESSOR  Take 0.5 tablets (12.5 mg total) by mouth 2 (two) times daily.     nitroGLYCERIN 0.4 MG SL tablet  Commonly known as:  NITROSTAT  Place 1 tablet (0.4 mg total) under the  tongue every 5 (five) minutes x 3 doses as needed for chest pain.     oxyCODONE-acetaminophen 5-325 MG tablet  Commonly known as:  ROXICET  Take 1 tablet by mouth every 4 (four) hours as needed for severe pain.        Duration of Discharge Encounter   Greater than 30 minutes including physician time.  Signed, Loralai Eisman PA-C 12/01/2015, 11:24 AM

## 2015-12-01 DIAGNOSIS — I25110 Atherosclerotic heart disease of native coronary artery with unstable angina pectoris: Principal | ICD-10-CM

## 2015-12-01 LAB — CBC
HCT: 42.4 % (ref 39.0–52.0)
Hemoglobin: 14.3 g/dL (ref 13.0–17.0)
MCH: 31.7 pg (ref 26.0–34.0)
MCHC: 33.7 g/dL (ref 30.0–36.0)
MCV: 94 fL (ref 78.0–100.0)
PLATELETS: 140 10*3/uL — AB (ref 150–400)
RBC: 4.51 MIL/uL (ref 4.22–5.81)
RDW: 13.2 % (ref 11.5–15.5)
WBC: 4.1 10*3/uL (ref 4.0–10.5)

## 2015-12-01 LAB — GLUCOSE, CAPILLARY: Glucose-Capillary: 259 mg/dL — ABNORMAL HIGH (ref 65–99)

## 2015-12-01 LAB — BASIC METABOLIC PANEL
ANION GAP: 6 (ref 5–15)
BUN: 10 mg/dL (ref 6–20)
CALCIUM: 8.6 mg/dL — AB (ref 8.9–10.3)
CO2: 31 mmol/L (ref 22–32)
Chloride: 104 mmol/L (ref 101–111)
Creatinine, Ser: 0.94 mg/dL (ref 0.61–1.24)
GLUCOSE: 236 mg/dL — AB (ref 65–99)
Potassium: 4.8 mmol/L (ref 3.5–5.1)
SODIUM: 141 mmol/L (ref 135–145)

## 2015-12-01 LAB — HEMOGLOBIN A1C
Hgb A1c MFr Bld: 9.6 % — ABNORMAL HIGH (ref 4.8–5.6)
Mean Plasma Glucose: 229 mg/dL

## 2015-12-01 MED ORDER — ATORVASTATIN CALCIUM 40 MG PO TABS: 20.0000 mg | ORAL_TABLET | Freq: Every evening | ORAL | Status: DC

## 2015-12-01 MED ORDER — DOXYCYCLINE HYCLATE 100 MG PO TABS: 100.0000 mg | ORAL_TABLET | Freq: Two times a day (BID) | ORAL | Status: DC

## 2015-12-01 MED ORDER — NITROGLYCERIN 0.4 MG SL SUBL: 0.4000 mg | SUBLINGUAL_TABLET | SUBLINGUAL | Status: DC | PRN

## 2015-12-01 MED ORDER — METOPROLOL TARTRATE 25 MG PO TABS: 12.5000 mg | ORAL_TABLET | Freq: Two times a day (BID) | ORAL | Status: DC

## 2015-12-01 MED ORDER — ISOSORBIDE MONONITRATE ER 30 MG PO TB24: 30.0000 mg | ORAL_TABLET | Freq: Every day | ORAL | Status: DC

## 2015-12-01 NOTE — Progress Notes (Signed)
Discharge teaching and instructions reviewed. No further questions. VVS. Prescriptions with pt. Pt belongings sent home. Discharging home with wife.

## 2015-12-01 NOTE — Progress Notes (Signed)
R wrist dressing changed. Iodoform packing removed. Area cleansed. 2x2's applied and then wrist wrapped. Pt was premedicated with Fentanyl 25 mcgs IV prior to dressing change. Fentanyl 50 mcgs was originally ordered but after administering vital signs were rechecked.B/P and HR had dropped. Pt was comfortable which enabled me to proceed without any additional medication. Pt tolerated procedure fairly well.

## 2015-12-01 NOTE — Progress Notes (Signed)
Dr. Benay PillowSze notified of HR 48-52 and that pt is taking Lopressor 25mg  PO Bid. Requested hold parameters. Present dose held. HR 47-49.

## 2015-12-01 NOTE — Progress Notes (Signed)
Patient Name: Jacob Downs Date of Encounter: 12/01/2015   SUBJECTIVE  Still having chest pain with exertion. Denies SOB or palpitations. Metoprolol was held due to HR of 50s, the patient is asymptomatic.   CURRENT MEDS . aspirin EC  81 mg Oral Daily  . atorvastatin  80 mg Oral QPM  . doxycycline  100 mg Oral Q12H  . famotidine  20 mg Oral BID  . insulin aspart  0-5 Units Subcutaneous QHS  . insulin aspart  0-9 Units Subcutaneous TID WC  . isosorbide mononitrate  30 mg Oral Daily  . metoprolol tartrate  25 mg Oral BID  . sodium chloride  3 mL Intravenous Q12H  . sodium chloride  3 mL Intravenous Q12H    OBJECTIVE  Filed Vitals:   11/30/15 1643 11/30/15 1927 11/30/15 2152 12/01/15 0550  BP: 108/68 105/58 115/67 91/51  Pulse: 64 67 52 57  Temp: 97.4 F (36.3 C) 97.7 F (36.5 C)  97.7 F (36.5 C)  TempSrc: Oral Oral  Oral  Resp: 15  20 18   Height:      Weight:    257 lb 8 oz (116.8 kg)  SpO2: 95% 97% 97% 97%    Intake/Output Summary (Last 24 hours) at 12/01/15 0816 Last data filed at 11/30/15 1500  Gross per 24 hour  Intake    480 ml  Output    350 ml  Net    130 ml   Filed Weights   11/29/15 1742 11/30/15 0640 12/01/15 0550  Weight: 264 lb 15.9 oz (120.2 kg) 253 lb 15.5 oz (115.2 kg) 257 lb 8 oz (116.8 kg)    PHYSICAL EXAM  General: Pleasant, NAD. Neuro: Alert and oriented X 3. Moves all extremities spontaneously. Psych: Normal affect. HEENT:  Normal  Neck: Supple without bruits or JVD. Lungs:  Resp regular and unlabored, CTA. Heart: RRR no s3, s4, or murmurs. Abdomen: Soft, non-tender, non-distended, BS + x 4.  Extremities: No clubbing, cyanosis or edema. DP/PT/Radials 2+ and equal bilaterally. L radial cath site without hematoma.   Accessory Clinical Findings  CBC  Recent Labs  11/30/15 0207 12/01/15 0411  WBC 5.2 4.1  NEUTROABS 2.9  --   HGB 15.2 14.3  HCT 43.6 42.4  MCV 91.8 94.0  PLT 154 140*   Basic Metabolic Panel  Recent  Labs  09/81/1900/08/07 0207 12/01/15 0411  NA 141 141  K 3.8 4.8  CL 104 104  CO2 28 31  GLUCOSE 239* 236*  BUN 10 10  CREATININE 0.66 0.94  CALCIUM 8.6* 8.6*   Liver Function Tests  Recent Labs  11/30/15 0207  AST 27  ALT 63  ALKPHOS 98  BILITOT 0.9  PROT 6.1*  ALBUMIN 3.6   No results for input(s): LIPASE, AMYLASE in the last 72 hours. Cardiac Enzymes  Recent Labs  11/30/15 0237  TROPONINI <0.03   Hemoglobin A1C  Recent Labs  11/30/15 0207  HGBA1C 9.6*   Fasting Lipid Panel  Recent Labs  11/30/15 0237  CHOL 126  HDL 30*  LDLCALC 79  TRIG 84  CHOLHDL 4.2   Thyroid Function Tests  Recent Labs  11/30/15 0207  TSH 2.060    TELE  Sinus bradycardia at rate of 50s, transiently in mid 40s.   Left Heart Cath and Coronary Angiography    Conclusion    1. Severe single vessel CAD 2. The LAD is moderate in caliber proximally and is heavily calcified. The vessel quickly becomes small in caliber  beyond the takeoff of the first diagonal branch. There is a 99% stenosis in the mid LAD just beyond the diagonal branch. The vessel is very small and diffusely diseased beyond the severe stenosis. The LAD is too small for PCI/stenting. The Diagonal branch is small in caliber with moderate proximal stenosis.  3. The RCA and Circumflex has mild non-obstructive disease.  4. Mild segmental LV systolic dysfunction.   Recommendations: His LAD is the likely culprit but it is a small, diffusely diseased vessel and not a favorable target for PCI/stenting. Will recommend medical management. Will start Imdur. Continue ASA, beta blocker and statin. Will arrange echo today.      Echo 11/30/2015 LV EF: 50% -  55%  ------------------------------------------------------------------- Indications:   Chest pain 786.51.  ------------------------------------------------------------------- Study Conclusions  - Left ventricle: The cavity size was normal. Wall thickness  was normal. Systolic function was normal. The estimated ejection fraction was in the range of 50% to 55%. Doppler parameters are consistent with abnormal left ventricular relaxation (grade 1 diastolic dysfunction). - Right ventricle: Endocardium of RV free wall is difficlult to see. No subcostal views. Overall RVEF is normal to mildly reduced.  Radiology/Studies  Dg Chest 2 View  11/29/2015  CLINICAL DATA:  Intermittent left upper chest pain when lying down for 6 days. Initial encounter. EXAM: CHEST  2 VIEW COMPARISON:  None. FINDINGS: The lungs are clear. Heart size is normal. There is no pneumothorax or pleural effusion. No focal bony abnormality. IMPRESSION: Negative chest. Electronically Signed   By: Drusilla Kanner M.D.   On: 11/29/2015 13:46    ASSESSMENT AND PLAN  1. Unstable angina (HCC) - Cath showed Cath showed 90% stenosed pro LAD and 70% stenosed mLAD, likely culprit but it is a small, diffusely diseased vessel and not a favorable target for PCI/stenting. Recommend medical management. The RCA and Circumflex has mild non-obstructive disease. Mild segmental LV systolic dysfunction with EF of 45-50%.  - Echo Lv EF of 50-55%, grade 1 DD, RVEF is normal to mildly reduced.  - Continue ASA, statin, imdur, BB. Consider adding ACE one BP is stable. BP of 91/51 this morning.   2.Sinus Bradycardia - Metoprolol 25mg  BID was held due to HR of 50s. The patient is asymptomatic. HR mostly in 50s with transient rate of mid 40s. Consider reducing dose.   3. DM - A1c of 9.6. Follow closely with PCP.   4. Skin abscess, right wrist.  - On doxycyline.   Signed, Advertising account executive PA-C Pager [phone]  Agree with note by Chelsea Aus PA-C  S/P LHC via the Left radial approach with small subtotally occluded mid LAD. Other cors OK. Too small for PCI. BB and nitrate started. OK for DC home this AM. ROV with Dr. Jens Som 2-3 weeks. If he doesn't become pain free mat need Ranexa. Would  get an OP MV to assess ischemia in the LAD territory.   Runell Gess, M.D., FACP, [facility], [facility] [address] [place], [place]  [address]  [phone] 12/01/2015 9:41 AM

## 2015-12-01 NOTE — Progress Notes (Signed)
Inpatient Diabetes Program Recommendations  AACE/ADA: New Consensus Statement on Inpatient Glycemic Control (2015)  Target Ranges:  Prepandial:   less than 140 mg/dL      Peak postprandial:   less than 180 mg/dL (1-2 hours)      Critically ill patients:  140 - 180 mg/dL    Results for Jacob Downs, Jacob Downs (MRN [id number]) as of 12/01/2015 08:48  Ref. Range 11/30/2015 07:47 11/30/2015 11:34 11/30/2015 16:43 11/30/2015 20:49 12/01/2015 07:49  Glucose-Capillary Latest Ref Range: 65-99 mg/dL 409207 (H) 811185 (H) 914206 (H) 207 (H) 259 (H)   Review of Glycemic Control  Please see note from DM coordinator on 1/9 for insulin recs for better inpatient control.  Thanks,  Christena DeemShannon Dariusz Brase RN, MSN, [facility] Inpatient Diabetes Coordinator Team Pager [phone] (8a-5p)

## 2015-12-02 ENCOUNTER — Telehealth: Payer: Self-pay | Admitting: Cardiology

## 2015-12-02 ENCOUNTER — Encounter: Payer: Self-pay | Admitting: *Deleted

## 2015-12-02 NOTE — Telephone Encounter (Signed)
D/C phone call .Marland Kitchen. Appt is on 12/10/15 at 8am w/[facility]onda [facility] .   Thanks

## 2015-12-02 NOTE — Telephone Encounter (Signed)
LMTCB

## 2015-12-03 ENCOUNTER — Telehealth: Payer: Self-pay | Admitting: Physician Assistant

## 2015-12-03 NOTE — Telephone Encounter (Signed)
Patient contacted regarding [facility] on 11/[facility].  Patient understands to follow up with provider Theodore Demarkhonda Barrett on 1/19 at [facility]am [facility]. Patient understands discharge instructions? Yes Patient understands medications and regiment? Yes Patient understands to bring all medications to this visit? Yes   Pt shares w/ me that he had taken 4x SL nitro over a 24 hr span. He notes he took this for chest pain. Pain seems to resolve completely w/ 1x dose - he has not had to take 2nd tablet any of these occurrences. He is concerned about the frequency of these, however.  He does not report SOB or other symptoms concurrent w/ CP. In addition to his [facility], he was started on isosorbide mono 30mg  in hospital.  We discussed intended goal of these medications. Pt voiced understanding and knowledge of this. Advised strongly to call 911 if taking Nitro (up to 3 doses) does not alleviate his pain - he is aware I will defer to Dr. Jens Somrenshaw on whether isosorbide dose (or other medications) need to be reviewed in advance of his appt w Bjorn Loserhonda. Pt has been encouraged to call for any questions or concerns.

## 2015-12-03 NOTE — Telephone Encounter (Signed)
Agree with plan as outlined and fu as scheduled. Olga MillersBrian Crenshaw

## 2015-12-03 NOTE — Telephone Encounter (Signed)
Called pt back to affirm directions as outlined, call could not be completed d/t a connectivity issue.

## 2015-12-03 NOTE — Telephone Encounter (Signed)
Received records from [facility] for appointment on 12/10/15 with Theodore Demarkhonda Barrett, PA.  Records given to [employer] (medical records) for Rhonda's schedule on 12/10/15. lp

## 2015-12-07 ENCOUNTER — Telehealth: Payer: Self-pay | Admitting: Cardiology

## 2015-12-07 NOTE — Telephone Encounter (Signed)
12/04/15 Patient brought FMLA and [facility] Statement to office for Dr Jens Somrenshaw to complete.  Forms/Check/AUTH given to Woody Seller Roberts by patient..  Forms given to me 12/07/15.  [facility] on 12/07/15. lp

## 2015-12-10 ENCOUNTER — Encounter: Payer: Self-pay | Admitting: Physician Assistant

## 2015-12-10 ENCOUNTER — Ambulatory Visit (INDEPENDENT_AMBULATORY_CARE_PROVIDER_SITE_OTHER): Payer: BLUE CROSS/BLUE SHIELD | Admitting: Physician Assistant

## 2015-12-10 VITALS — BP 128/82 | HR 65 | Ht 72.0 in | Wt 263.3 lb

## 2015-12-10 DIAGNOSIS — E785 Hyperlipidemia, unspecified: Secondary | ICD-10-CM | POA: Diagnosis not present

## 2015-12-10 DIAGNOSIS — I209 Angina pectoris, unspecified: Secondary | ICD-10-CM | POA: Diagnosis not present

## 2015-12-10 DIAGNOSIS — E119 Type 2 diabetes mellitus without complications: Secondary | ICD-10-CM

## 2015-12-10 DIAGNOSIS — E1159 Type 2 diabetes mellitus with other circulatory complications: Secondary | ICD-10-CM

## 2015-12-10 MED ORDER — ISOSORBIDE MONONITRATE ER 30 MG PO TB24: 30.0000 mg | ORAL_TABLET | Freq: Every day | ORAL | Status: DC

## 2015-12-10 MED ORDER — METOPROLOL TARTRATE 25 MG PO TABS
12.5000 mg | ORAL_TABLET | Freq: Two times a day (BID) | ORAL | Status: DC
Start: 2015-12-10 — End: 2016-12-14

## 2015-12-10 MED ORDER — ATORVASTATIN CALCIUM 40 MG PO TABS: 20.0000 mg | ORAL_TABLET | Freq: Every evening | ORAL | Status: DC

## 2015-12-10 NOTE — Patient Instructions (Signed)
Medication Instructions:  Your physician recommends that you continue on your current medications as directed. Please refer to the Current Medication list given to you today.   Labwork: none  Testing/Procedures: none  Follow-Up: Your physician wants you to follow-up in: 6 months with Dr. Clifton James. You will receive a reminder letter in the mail two months in advance. If you don't receive a letter, please call our office to schedule the follow-up appointment.   Any Other Special Instructions Will Be Listed Below (If Applicable).     If you need a refill on your cardiac medications before your next appointment, please call your pharmacy.

## 2015-12-10 NOTE — Progress Notes (Signed)
Cardiology Office Note   Date:  12/10/2015   ID:  Jacob Downs, DOB Mar 09, 1961, MRN [id number]  PCP:  Mickie Hillier, MD  Cardiologist:  Dr Clifton James, pt prefers  [name], [place]   Chief Complaint  Patient presents with  . Follow-up    CATH without Intervention// pt c/o chest tightness when he got out of the hospital, no other Sx. or concerns.    History of Present Illness: Jacob Downs is a 55 y.o. male with a history of DM, HL, GERD, obesity.  Dr. Clarene Duke saw for chest pain>>cardiac eval planned>>2 days later, pain more severe>>ER w/ admit and cath. 99% LAD at cath, small vessel and med rx only option, EF nl.  Jacob Downs presents for f/u of hospitalization and cath.  After d/c from the hospital, he had 4 episodes of chest pain, the next day 2 episodes, then 1 episode and none since. No problems with medications. Each episode of chest pain relieved w/ SL NTG x 1 and rest. Wants to know if he can resume exercise. No chest pain climbing stairs or other routine activities. Wants to know if he can eat before exercise.    Past Medical History  Diagnosis Date  . Diabetes mellitus without complication (HCC)   . Hyperlipemia   . GERD (gastroesophageal reflux disease)   . Arthritis   . Shingles     4-5 years ago  . Tuberculosis     As a child  . Cellulitis of left hand 11/27/2015    Past Surgical History  Procedure Laterality Date  . Plantar fascia surgery  06,07    both feet  . Orif ankle fracture  10/03/2012    Procedure: OPEN REDUCTION INTERNAL FIXATION (ORIF) ANKLE FRACTURE;  Surgeon: Nestor Lewandowsky, MD;  Location: [facility];  Service: Orthopedics;  Laterality: Left;  . Colonoscopy    . Tonsillectomy      as a child  . I&d extremity Right 11/26/2014    Procedure: INCISION AND DRAINAGE OF RIGHT INDEX FINGER;  Surgeon: Dairl Ponder, MD;  Location: [facility];  Service: Orthopedics;  Laterality: Right;  . Cardiac catheterization N/A  11/30/2015    Procedure: Left Heart Cath and Coronary Angiography;  Surgeon: Kathleene Hazel, MD;  Location: [facility] INVASIVE CV LAB;  Service: Cardiovascular;  Laterality: N/A;    Current Outpatient Prescriptions  Medication Sig Dispense Refill  . aspirin 81 MG tablet Take 81 mg by mouth daily.    Marland Kitchen atorvastatin (LIPITOR) 40 MG tablet Take 0.5 tablets (20 mg total) by mouth every evening. 30 tablet 6  . famotidine (PEPCID) 20 MG tablet Take 20 mg by mouth as needed for heartburn.     . isosorbide mononitrate (IMDUR) 30 MG 24 hr tablet Take 1 tablet (30 mg total) by mouth daily. 30 tablet 6  . metFORMIN (GLUCOPHAGE) 500 MG tablet Take 500-1,000 mg by mouth 2 (two) times daily with a meal. Take 2 tablets in the morning Take 1 tablet in the evening    . metoprolol tartrate (LOPRESSOR) 25 MG tablet Take 0.5 tablets (12.5 mg total) by mouth 2 (two) times daily. 30 tablet 6  . nitroGLYCERIN (NITROSTAT) 0.4 MG SL tablet Place 1 tablet (0.4 mg total) under the tongue every 5 (five) minutes x 3 doses as needed for chest pain. 25 tablet 12   No current facility-administered medications for this visit.    Allergies:   Review of patient's allergies indicates no known allergies.  Social History:  The patient  reports that he has never smoked. He has never used smokeless tobacco. He reports that he drinks alcohol. He reports that he does not use illicit drugs.   Family History:  The patient's family history is negative for Colon cancer.    ROS:  Please see the history of present illness. All other systems are reviewed and negative.    PHYSICAL EXAM: VS:  BP 128/82 mmHg  Pulse 65  Ht 6' (1.829 m)  Wt 263 lb 4.8 oz (119.432 kg)  BMI 35.70 kg/m2 , BMI Body mass index is 35.7 kg/(m^2). GEN: Well nourished, well developed, male in no acute distress HEENT: normal for age  Neck: no JVD, no carotid bruit, no masses Cardiac: RRR; no murmur, no rubs, or gallops Respiratory:  clear to auscultation  bilaterally, normal work of breathing GI: soft, nontender, nondistended, + BS MS: no deformity or atrophy; no edema; distal pulses are 2+ in all 4 extremities. L radial cath site well-healed. Skin: warm and dry, no rash Neuro:  Strength and sensation are intact Psych: euthymic mood, full affect   EKG:  EKG is ordered today. The ekg ordered today demonstrates SR, no acute ischemic changes, no Q waves  Cath: 11/30/2015 1. Severe single vessel CAD 2. The LAD is moderate in caliber proximally and is heavily calcified. The vessel quickly becomes small in caliber beyond the takeoff of the first diagonal branch. There is a 99% stenosis in the mid LAD just beyond the diagonal branch. The vessel is very small and diffusely diseased beyond the severe stenosis. The LAD is too small for PCI/stenting. The Diagonal branch is small in caliber with moderate proximal stenosis.  3. The RCA and Circumflex has mild non-obstructive disease.  4. Mild segmental LV systolic dysfunction.   Recommendations: His LAD is the likely culprit but it is a small, diffusely diseased vessel and not a favorable target for PCI/stenting. Will recommend medical management. Will start Imdur. Continue ASA, beta blocker and statin. Will arrange echo today.        Recent Labs: 11/30/2015: ALT 63; TSH 2.060 12/01/2015: BUN 10; Creatinine, Ser 0.94; Hemoglobin 14.3; Platelets 140*; Potassium 4.8; Sodium 141    Lipid Panel    Component Value Date/Time   CHOL 126 11/30/2015 0237   TRIG 84 11/30/2015 0237   HDL 30* 11/30/2015 0237   CHOLHDL 4.2 11/30/2015 0237   VLDL 17 11/30/2015 0237   LDLCALC 79 11/30/2015 0237     Wt Readings from Last 3 Encounters:  12/10/15 263 lb 4.8 oz (119.432 kg)  12/01/15 257 lb 8 oz (116.8 kg)  11/26/14 265 lb (120.203 kg)     Other studies Reviewed: Additional studies/ records that were reviewed today include: Dr Fredirick Maudlin office notes, hospital records and testing.   ASSESSMENT AND  PLAN:  1.  Angina pectoris: discussed collateral circulation formation, discussed need to increase activity as he tolerates. Advised him OK to treat angina, if he is having a lot, can increase Imdur, but no need to do that now. On ASA, BB, statin.  2. Hyperlipidemia: Followed by Dr Clarene Duke, on statin, goal LDL < 70  3. DM 2: discussed dietary restrictions, need for protein with every meal, 5 meals 300 calories would be ideal, fewest calories when he gets off work and goes to bed.    Current medicines are reviewed at length with the patient today.  The patient does not have concerns regarding medicines.  The following changes have been made:  no change  Labs/ tests ordered today include:  No orders of the defined types were placed in this encounter.     Disposition:   FU with Dr Clifton James, pt greatly prefers him.  Tawny Asal  12/10/2015 8:57 AM    [facility] [address] [place], [place], [place]  [address] Phone: [phone]; Fax: [phone]

## 2015-12-10 NOTE — Addendum Note (Signed)
Addended by: Theressa Stamps on: 12/10/2015 12:38 PM   Modules accepted: Orders

## 2015-12-24 ENCOUNTER — Telehealth: Payer: Self-pay | Admitting: Cardiology

## 2015-12-24 NOTE — Telephone Encounter (Signed)
Received FMLA forms and [facility] Physicians Statement back from [employer] @ [facility] for Dr Jens Som to review and sign.  Forms given to Jolyn Nap, RN for Dr Jens Som to sign and return. lp

## 2015-12-31 ENCOUNTER — Telehealth: Payer: Self-pay | Admitting: Cardiology

## 2015-12-31 NOTE — Telephone Encounter (Signed)
Received signed FMLA/AFLAC Forms back from Dr Jens Som.  Notified Mrs Brill that forms were ready.  Faxed on 12/31/15. lp

## 2016-11-01 ENCOUNTER — Telehealth: Payer: Self-pay | Admitting: Cardiology

## 2016-11-01 NOTE — Telephone Encounter (Signed)
New message   *STAT* If patient is at the pharmacy, call can be transferred to refill team.   1. Which medications need to be refilled? (please list name of each medication and dose if known) Nitroglycerin 0.4mg   2. Which pharmacy/location (including street and city if local pharmacy) is medication to be sent to? Per pt wife wants to be sent to a new pharmacy b/c of change of insurance; [address], [place] [id number]  3. Do they need a 30 day or 90 day supply? 30

## 2016-11-02 ENCOUNTER — Other Ambulatory Visit: Payer: Self-pay

## 2016-11-02 MED ORDER — NITROGLYCERIN 0.4 MG SL SUBL: 0.4000 mg | SUBLINGUAL_TABLET | SUBLINGUAL | 4 refills | Status: DC | PRN

## 2016-11-29 NOTE — Progress Notes (Signed)
HPI: FU CAD. Admitted with UA 1/17. Cardiac cath 1/17 showed 99 LAD; small in caliber after lesion (PCI not possible) and medical therapy recommended; nonobstructive disease in RCA and Lcx; mild LV dysfunction. Echocardiogram January 2017 showed ejection fraction 50-55% and grade 1 diastolic dysfunction. Since last seen, he has occasional chest tightness with more vigorous activities relieved with slowing down. This is unchanged. No rest symptoms. Dyspnea with more extreme activities but no orthopnea, PND or pedal edema.  Current Outpatient Prescriptions  Medication Sig Dispense Refill  . aspirin 81 MG tablet Take 81 mg by mouth daily.    Marland Kitchen atorvastatin (LIPITOR) 40 MG tablet Take 0.5 tablets (20 mg total) by mouth every evening. 60 tablet 6  . famotidine (PEPCID) 20 MG tablet Take 20 mg by mouth as needed for heartburn.     . isosorbide mononitrate (IMDUR) 30 MG 24 hr tablet Take 1 tablet (30 mg total) by mouth daily. 90 tablet 3  . metFORMIN (GLUCOPHAGE) 500 MG tablet Take 500-1,000 mg by mouth 2 (two) times daily with a meal. Take 2 tablets in the morning Take 1 tablet in the evening    . metoprolol tartrate (LOPRESSOR) 25 MG tablet Take 0.5 tablets (12.5 mg total) by mouth 2 (two) times daily. 90 tablet 3  . nitroGLYCERIN (NITROSTAT) 0.4 MG SL tablet Place 1 tablet (0.4 mg total) under the tongue every 5 (five) minutes x 3 doses as needed for chest pain. 25 tablet 4   No current facility-administered medications for this visit.      Past Medical History:  Diagnosis Date  . Arthritis   . Cellulitis of left hand 11/27/2015  . Diabetes mellitus without complication (HCC)   . GERD (gastroesophageal reflux disease)   . Hyperlipemia   . Shingles    4-5 years ago  . 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 CARDIAC CATHETERIZATION N/A 11/30/2015   Procedure: Left Heart Cath and Coronary Angiography;  Surgeon: Kathleene Hazel, MD;   [facility]: [facility] INVASIVE CV LAB;  Service: Cardiovascular;  Laterality: N/A;  . COLONOSCOPY    . I&D EXTREMITY Right 11/26/2014   Procedure: INCISION AND DRAINAGE OF RIGHT INDEX FINGER;  Surgeon: Dairl Ponder, MD;  Location: [facility];  Service: Orthopedics;  Laterality: Right;  . ORIF ANKLE FRACTURE  10/03/2012   Procedure: OPEN REDUCTION INTERNAL FIXATION (ORIF) ANKLE FRACTURE;  Surgeon: Nestor Lewandowsky, MD;  Location: [facility];  Service: Orthopedics;  Laterality: Left;  . PLANTAR FASCIA SURGERY  06,07   both feet  . TONSILLECTOMY     as a child    Social History   Social History  . Marital status: Married    Spouse name: N/A  . Number of children: 2  . Years of education: N/A   Occupational History  . Works 6p-6a    Social History Main Topics  . Smoking status: Never Smoker  . Smokeless tobacco: Never Used  . Alcohol use 0.0 oz/week     Comment: 6 to 12 beers per week  . Drug use: No  . Sexual activity: Not on file   Other Topics Concern  . Not on file   Social History Narrative  . No narrative on file    Family History  Problem Relation Age of Onset  . Colon cancer Neg Hx   . CAD      No family history  ROS: no fevers or chills, productive cough, hemoptysis, dysphasia, odynophagia, melena, hematochezia, dysuria, hematuria, rash, seizure activity, orthopnea, PND, pedal edema, claudication. Remaining systems are negative.  Physical Exam: Well-developed obese in no acute distress.  Skin is warm and dry.  HEENT is normal.  Neck is supple.  Chest is clear to auscultation with normal expansion.  Cardiovascular exam is regular rate and rhythm.  Abdominal exam nontender or distended. No masses palpated. Extremities show trace edema. neuro grossly intact  ECG-Sinus rhythm at a rate of 81. No ST changes.  A/P  1 coronary artery disease-continue aspirin and statin. He is having occasional chest tightness with more vigorous activities but not routine  activities. Symptoms improved with slowing down. His symptoms are unchanged compared to one year ago. Continue medical therapy. Increase isosorbide to 60 mg daily and continue metoprolol.  2 hyperlipidemia-given documented coronary disease increase Lipitor to 80 mg daily. Check lipids and liver in 4 weeks.  3 hypertension-blood pressure controlled. Continue present medications.  Olga MillersBrian Crenshaw, MD

## 2016-12-02 ENCOUNTER — Ambulatory Visit (INDEPENDENT_AMBULATORY_CARE_PROVIDER_SITE_OTHER): Payer: BLUE CROSS/BLUE SHIELD | Admitting: Cardiology

## 2016-12-02 ENCOUNTER — Encounter: Payer: Self-pay | Admitting: Cardiology

## 2016-12-02 VITALS — BP 128/78 | HR 80 | Ht 72.0 in | Wt 273.2 lb

## 2016-12-02 DIAGNOSIS — I10 Essential (primary) hypertension: Secondary | ICD-10-CM | POA: Diagnosis not present

## 2016-12-02 DIAGNOSIS — E7800 Pure hypercholesterolemia, unspecified: Secondary | ICD-10-CM | POA: Diagnosis not present

## 2016-12-02 DIAGNOSIS — I2510 Atherosclerotic heart disease of native coronary artery without angina pectoris: Secondary | ICD-10-CM

## 2016-12-02 MED ORDER — ATORVASTATIN CALCIUM 80 MG PO TABS: 80.0000 mg | ORAL_TABLET | Freq: Every evening | ORAL | 3 refills | Status: DC

## 2016-12-02 MED ORDER — ISOSORBIDE MONONITRATE ER 60 MG PO TB24: 60.0000 mg | ORAL_TABLET | Freq: Every day | ORAL | 3 refills | Status: DC

## 2016-12-02 NOTE — Patient Instructions (Signed)
Medication Instructions:   INCREASE ISOSORBIDE TO 60 MG ONCE DAILY= 2 OF THE 30 MG TABLETS ONCE DAILY  INCREASE ATORVASTATIN TO 80 MG ONCE DAILY= 2 OF THE 40 MG TABLETS ONCE DAILY  Labwork:  Your physician recommends that you return for lab work in: 4 WEEKS = DO NOT EAT PRIOR TO LAB WORK  Follow-Up:  Your physician wants you to follow-up in: ONE YEAR WITH DR Shelda PalRENSHAW You will receive a reminder letter in the mail two months in advance. If you don't receive a letter, please call our office to schedule the follow-up appointment.   If you need a refill on your cardiac medications before your next appointment, please call your pharmacy.

## 2016-12-14 ENCOUNTER — Other Ambulatory Visit: Payer: Self-pay | Admitting: *Deleted

## 2016-12-14 ENCOUNTER — Other Ambulatory Visit: Payer: Self-pay

## 2016-12-14 DIAGNOSIS — I2510 Atherosclerotic heart disease of native coronary artery without angina pectoris: Secondary | ICD-10-CM

## 2016-12-14 MED ORDER — METOPROLOL TARTRATE 25 MG PO TABS: 12.5000 mg | ORAL_TABLET | Freq: Two times a day (BID) | ORAL | 3 refills | Status: DC

## 2016-12-14 MED ORDER — ISOSORBIDE MONONITRATE ER 60 MG PO TB24: 60.0000 mg | ORAL_TABLET | Freq: Every day | ORAL | 3 refills | Status: DC

## 2016-12-14 MED ORDER — ATORVASTATIN CALCIUM 80 MG PO TABS: 80.0000 mg | ORAL_TABLET | Freq: Every evening | ORAL | 3 refills | Status: DC

## 2017-01-26 IMAGING — DX DG CHEST 2V
2 series · 2 of 2 positions shown · non-contrast
Comparison: None.

CLINICAL DATA: Intermittent left upper chest pain when lying down
for 6 days. Initial encounter.

EXAM:
CHEST  2 VIEW

[chest pa]
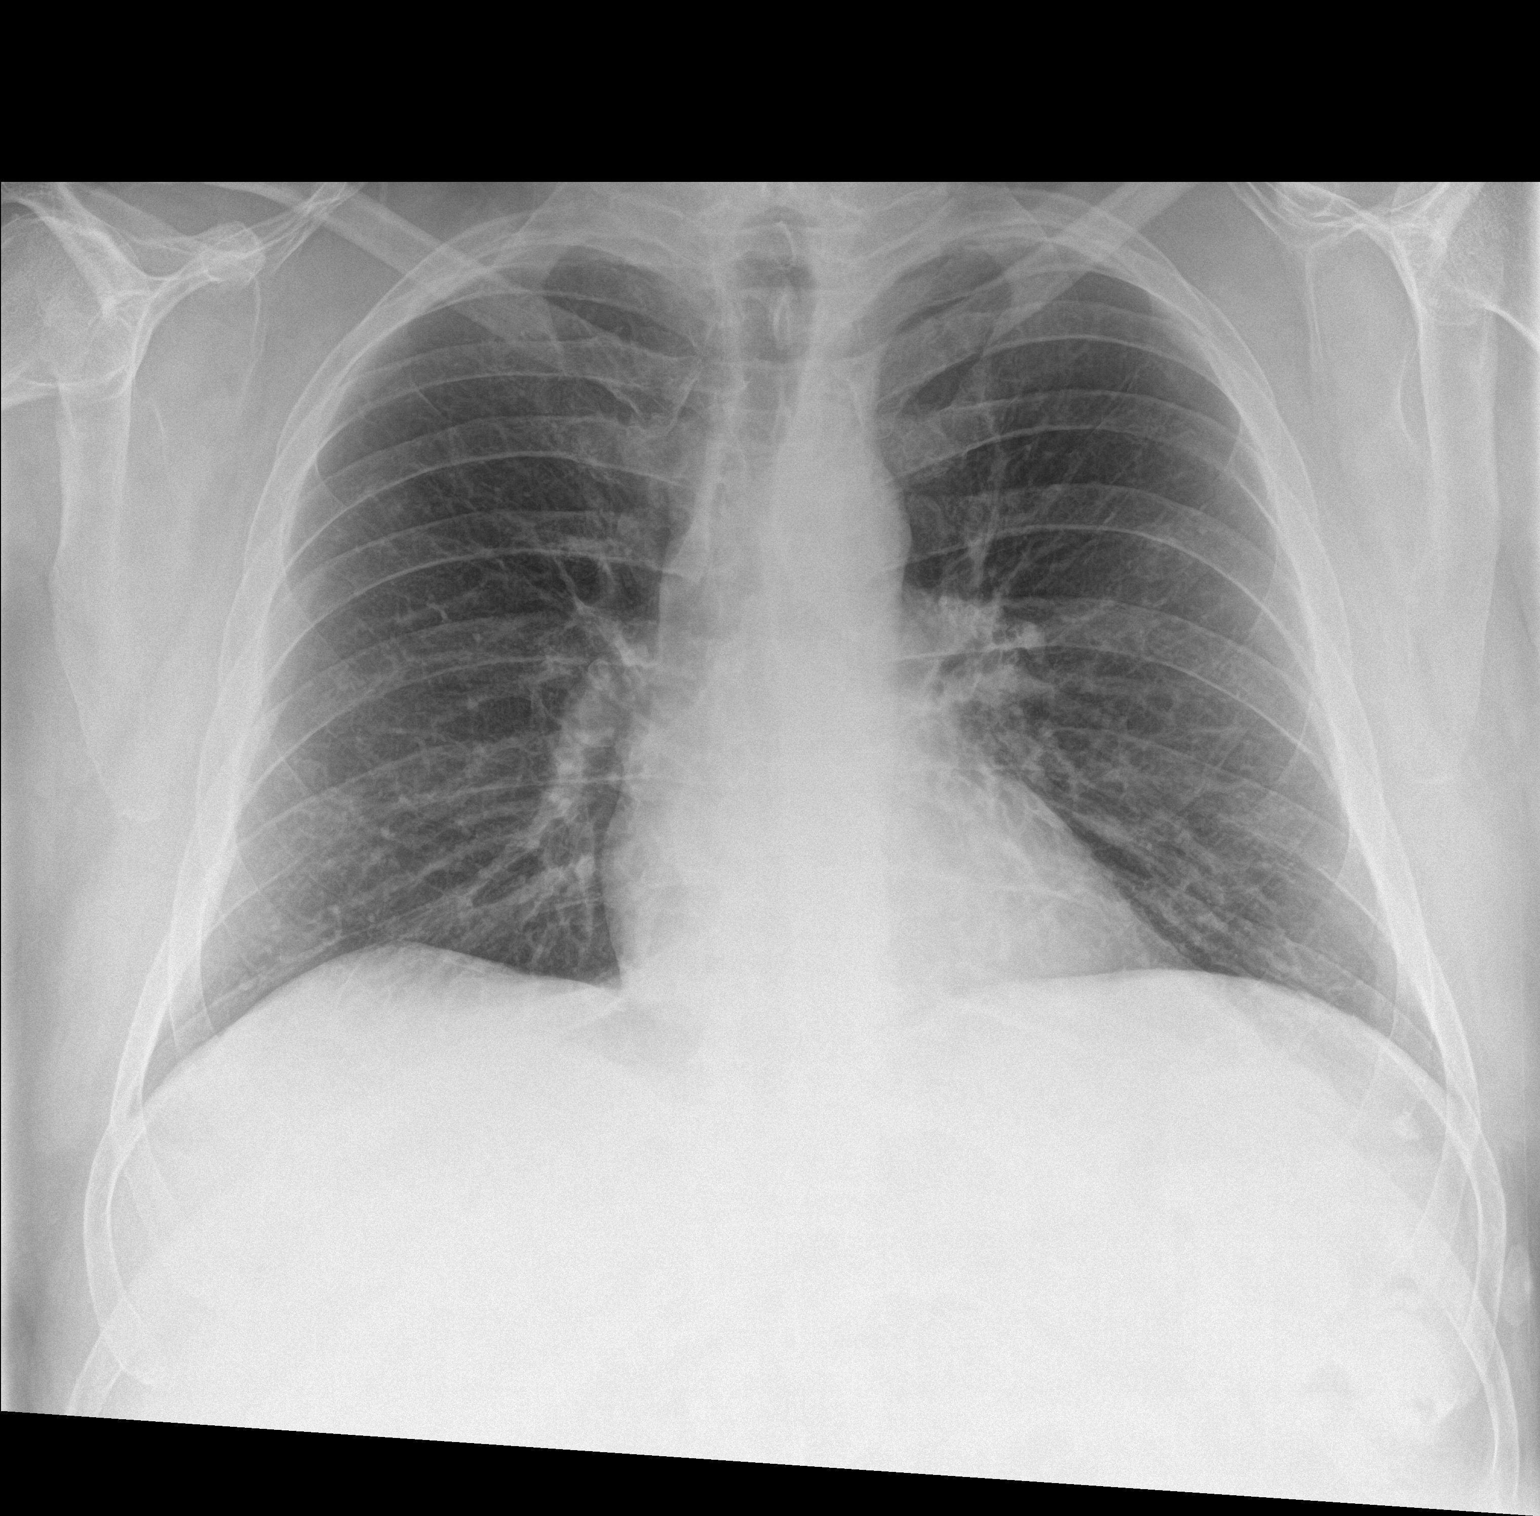

[chest lat]
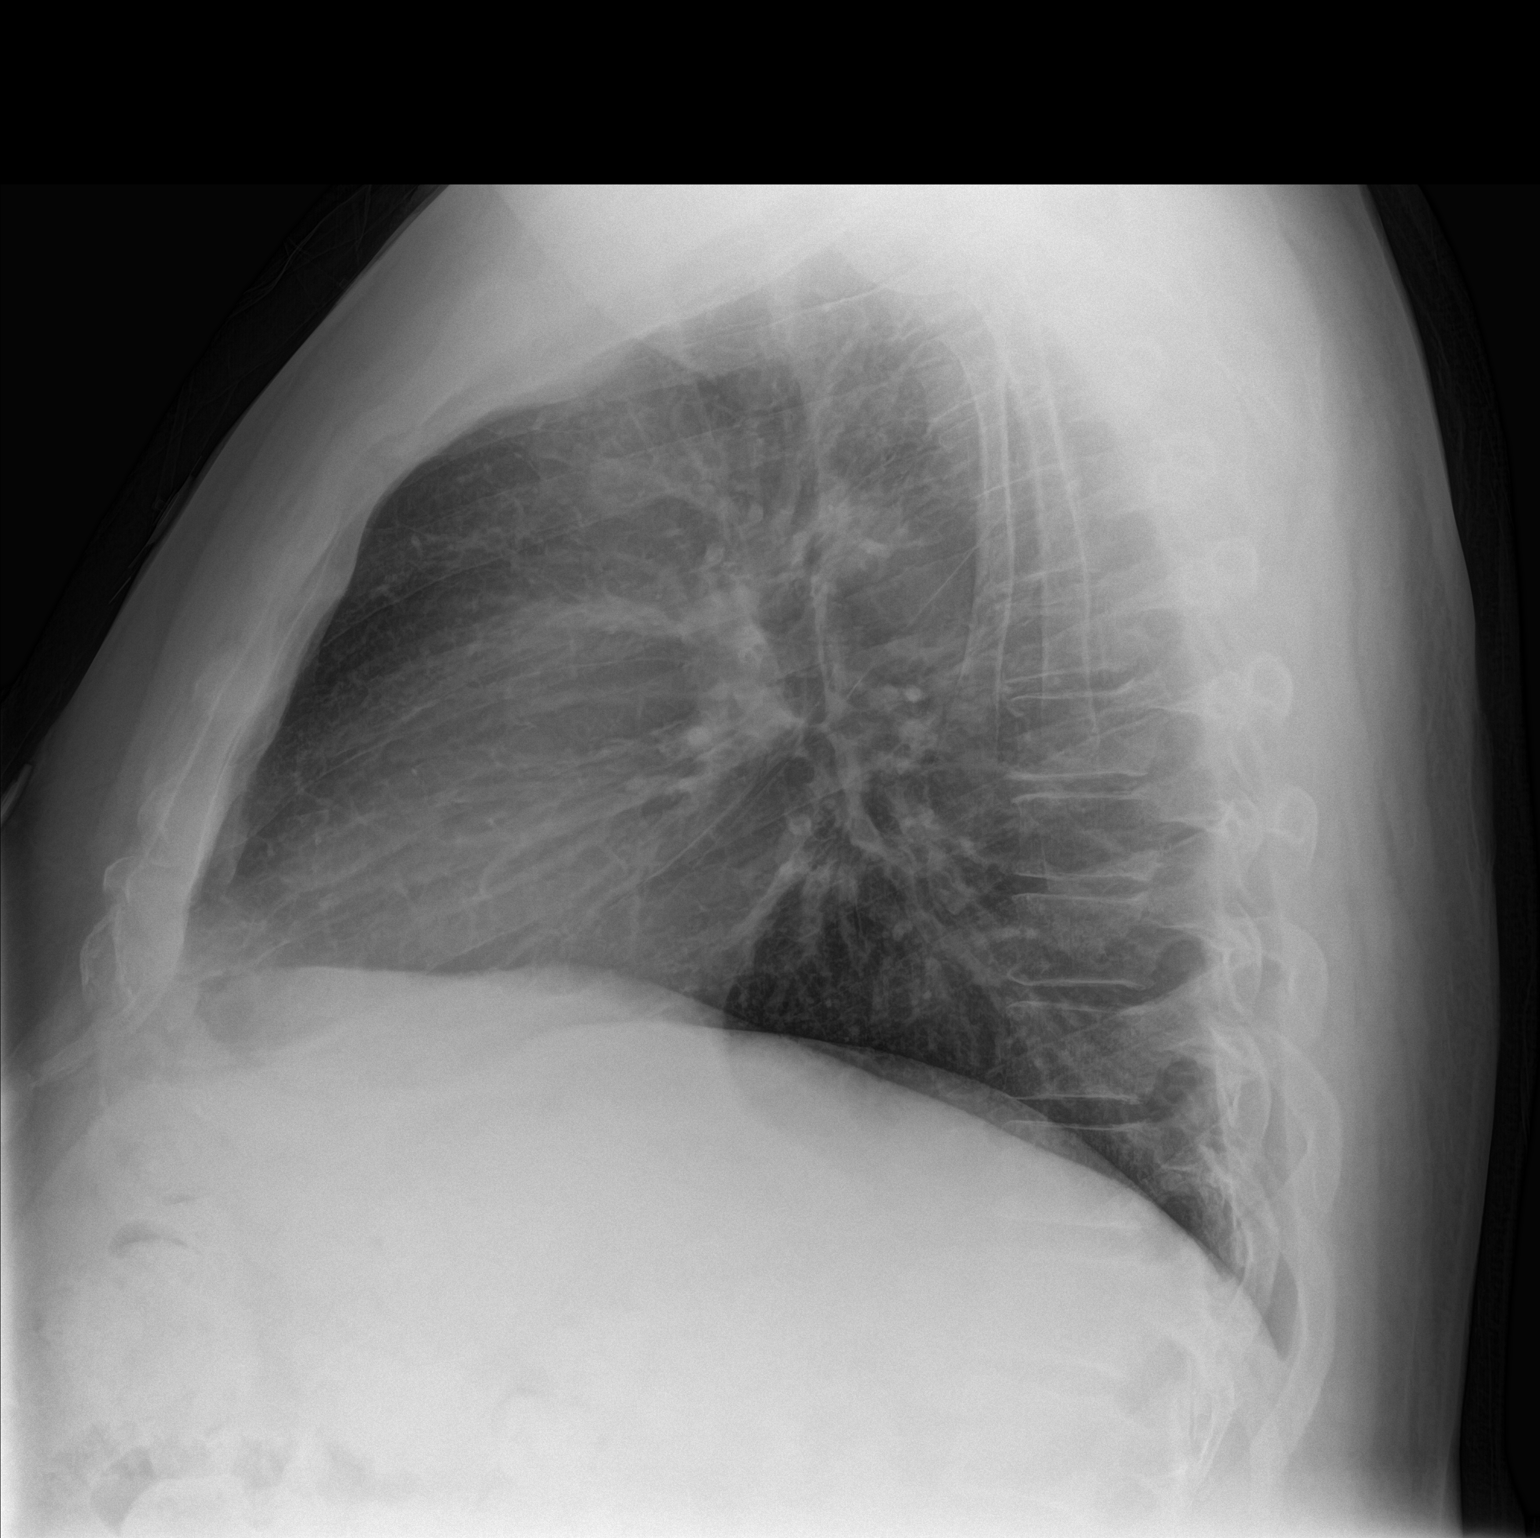

[2 of 2 positions shown; findings below may reference images not displayed]

FINDINGS: The lungs are clear. Heart size is normal. There is no pneumothorax
or pleural effusion. No focal bony abnormality.
IMPRESSION: Negative chest.

## 2017-01-31 ENCOUNTER — Encounter: Payer: Self-pay | Admitting: *Deleted

## 2017-11-16 ENCOUNTER — Other Ambulatory Visit: Payer: Self-pay | Admitting: Cardiology

## 2017-11-16 DIAGNOSIS — I2510 Atherosclerotic heart disease of native coronary artery without angina pectoris: Secondary | ICD-10-CM

## 2017-12-11 ENCOUNTER — Other Ambulatory Visit: Payer: Self-pay | Admitting: Cardiology

## 2017-12-13 ENCOUNTER — Telehealth: Payer: Self-pay | Admitting: Adult Health

## 2017-12-13 ENCOUNTER — Telehealth: Payer: Self-pay | Admitting: Cardiology

## 2017-12-13 MED ORDER — METOPROLOL TARTRATE 25 MG PO TABS: ORAL_TABLET | ORAL | 0 refills | Status: DC

## 2017-12-13 NOTE — Telephone Encounter (Signed)
*  STAT* If patient is at the pharmacy, call can be transferred to refill team.   1. Which medications need to be refilled? (please list name of each medication and dose if known) metoprolol tartrate (LOPRESSOR) 25 MG tablet 2. Which pharmacy/location (including street and city if local pharmacy) is medication to be sent to? [facility] [phone] - [place], [place] - [address]. Do they need a 30 day or 90 day supply? 30 day supply

## 2017-12-13 NOTE — Telephone Encounter (Signed)
Rx has been sent to the pharmacy electronically. ° °

## 2017-12-13 NOTE — Telephone Encounter (Signed)
New Message    *STAT* If patient is at the pharmacy, call can be transferred to refill team.   1. Which medications need to be refilled? (please list name of each medication and dose if known) metoprolol tartrate (LOPRESSOR) 25 MG tablet 2. Which pharmacy/location (including street and city if local pharmacy) is medication to be sent to? [facility] [address]  3. Do they need a 30 day or 90 day supply? 30 day supply

## 2017-12-26 ENCOUNTER — Ambulatory Visit: Payer: BLUE CROSS/BLUE SHIELD | Admitting: Adult Health

## 2017-12-27 ENCOUNTER — Encounter: Payer: Self-pay | Admitting: Adult Health

## 2017-12-27 ENCOUNTER — Ambulatory Visit (INDEPENDENT_AMBULATORY_CARE_PROVIDER_SITE_OTHER): Payer: BLUE CROSS/BLUE SHIELD | Admitting: Adult Health

## 2017-12-27 ENCOUNTER — Ambulatory Visit: Payer: BLUE CROSS/BLUE SHIELD | Admitting: Adult Health

## 2017-12-27 VITALS — BP 125/79 | HR 86 | Resp 16 | Ht 72.0 in | Wt 258.6 lb

## 2017-12-27 DIAGNOSIS — E118 Type 2 diabetes mellitus with unspecified complications: Secondary | ICD-10-CM

## 2017-12-27 DIAGNOSIS — E7800 Pure hypercholesterolemia, unspecified: Secondary | ICD-10-CM

## 2017-12-27 DIAGNOSIS — I2510 Atherosclerotic heart disease of native coronary artery without angina pectoris: Secondary | ICD-10-CM | POA: Diagnosis not present

## 2017-12-27 MED ORDER — ISOSORBIDE MONONITRATE ER 60 MG PO TB24: 60.0000 mg | ORAL_TABLET | Freq: Every day | ORAL | 3 refills | Status: DC

## 2017-12-27 MED ORDER — METOPROLOL TARTRATE 25 MG PO TABS: ORAL_TABLET | ORAL | 3 refills | Status: DC

## 2017-12-27 MED ORDER — ATORVASTATIN CALCIUM 80 MG PO TABS: 80.0000 mg | ORAL_TABLET | Freq: Every evening | ORAL | 3 refills | Status: DC

## 2017-12-27 NOTE — Progress Notes (Signed)
Cardiology Office Note   Date:  12/27/2017   ID:  Jacob SilvanSteven R Biebel, DOB 03/03/1961, MRN [id number]  PCP:  Catha GosselinLittle, Kevin, MD  Cardiologist:  Dr. Jens Somrenshaw  Chief Complaint  Patient presents with  . Coronary Artery Disease  . Shortness of Breath     History of Present Illness: Jacob Downs is a 57 y.o. male who presents for ongoing assessment and management of coronary artery disease, with cardiac catheterization in January 2017 which revealed a 99% stenosed LAD, small in caliber after the lesion and therefore PCI was not possible, medical therapy was recommended. He had nonobstructive disease in the RCA and circumflex with mild LV dysfunction with echocardiogram revealing an EF of 50-55% and grade 1 diastolic dysfunction. The patient was last seen by Dr. Jens Somrenshaw on 12/02/2016.Marland Kitchen.  On that visit the patient was continued to have occasional chest tightness with more vigorous activities, but not with normal activities of daily life. His symptoms improved when he slowed down. He was continued on medical therapy, with increased dose of isosorbide 60 mg daily, and he was to continue metoprolol. Due to elevated cholesterol status, Lipitor was increased to 80 mg daily with follow-up check of his labs in one month.  Lipid profile 11/30/2015: Cholesterol 126; HDL 30; LDL 79; triglycerides 84. LFTs were within normal limits without any abnormalities.  He comes today without any complaints. He has been very inactive over the last 3 months as he is working 5 days a week 12 hour shifts, and Saturday 8 hours. He states that he is exhausted when he gets home and does not feel like walking or doing any exercises especially now that we are in winter months. He states he used to go to the gym almost everyday and walk on the treadmill and do some weightlifting.  He denies worsening dyspnea, chest pain, or fatigue with exertional activities. 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 He is  medically compliant. He is due to follow-up with his PCP in 2 weeks. PCP orders labs for annual surveillance.  Past Medical History:  Diagnosis Date  . Arthritis   . Cellulitis of left hand 11/27/2015  . Diabetes mellitus without complication (HCC)   . GERD (gastroesophageal reflux disease)   . Hyperlipemia   . Shingles    4-5 years ago  . 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 CARDIAC CATHETERIZATION N/A 11/30/2015   Procedure: Left Heart Cath and Coronary Angiography;  Surgeon: Kathleene Hazelhristopher D McAlhany, MD;  [facility];  Service: Cardiovascular;  Laterality: N/A;  . COLONOSCOPY    . I&D EXTREMITY Right 11/26/2014   Procedure: INCISION AND DRAINAGE OF RIGHT INDEX FINGER;  Surgeon: Dairl PonderMatthew Weingold, MD;  Location: [facility];  Service: Orthopedics;  Laterality: Right;  . ORIF ANKLE FRACTURE  10/03/2012   Procedure: OPEN REDUCTION INTERNAL FIXATION (ORIF) ANKLE FRACTURE;  Surgeon: Nestor LewandowskyFrank J Rowan, MD;  Location: [facility];  Service: Orthopedics;  Laterality: Left;  . PLANTAR FASCIA SURGERY  06,07   both feet  . TONSILLECTOMY     as a child     Current Outpatient Medications  Medication Sig Dispense Refill  . aspirin 81 MG tablet Take 81 mg by mouth daily.    Marland Kitchen. atorvastatin (LIPITOR) 80 MG tablet Take 1 tablet (80 mg total) by mouth every evening. 90 tablet 3  . famotidine (PEPCID) 20 MG  tablet Take 20 mg by mouth as needed for heartburn.     . isosorbide mononitrate (IMDUR) 60 MG 24 hr tablet Take 1 tablet (60 mg total) by mouth daily. 90 tablet 3  . metFORMIN (GLUCOPHAGE) 500 MG tablet Take 500-1,000 mg by mouth 2 (two) times daily with a meal. Take 2 tablets in the morning Take 1 tablet in the evening    . metoprolol tartrate (LOPRESSOR) 25 MG tablet Take one-half(1/2) tablet twice a day 90 tablet 3  . nitroGLYCERIN (NITROSTAT) 0.4 MG SL tablet Place 1 tablet (0.4 mg total) under the tongue every 5 (five)  minutes x 3 doses as needed for chest pain. 25 tablet 4   No current facility-administered medications for this visit.     Allergies:   Patient has no known allergies.    Social History:  The patient  reports that  has never smoked. he has never used smokeless tobacco. He reports that he drinks alcohol. He reports that he does not use drugs.   Family History:  The patient's family history includes CAD in his unknown relative.    ROS: All other systems are reviewed and negative. Unless otherwise mentioned in H&P    PHYSICAL EXAM: VS:  BP 125/79   Pulse 86   Resp 16   Ht 6' (1.829 m)   Wt 258 lb 9.6 oz (117.3 kg)   SpO2 96%   BMI 35.07 kg/m  , BMI Body mass index is 35.07 kg/m. GEN: Well nourished, well developed, in no acute distress obese HEENT: normal  Neck: no JVD, carotid bruits, or masses Cardiac: RRR; no murmurs, rubs, or gallops,no edema  Respiratory:  clear to auscultation bilaterally, normal work of breathing GI: soft, nontender, nondistended, + BS MS: no deformity or atrophy  Skin: warm and dry, no rash Neuro:  Strength and sensation are intact Psych: euthymic mood, full affect   EKG:  Normal sinus rhythm, heart rate of 85 bpm  Recent Labs: No results found for requested labs within last 8760 hours.    Lipid Panel    Component Value Date/Time   CHOL 126 11/30/2015 0237   TRIG 84 11/30/2015 0237   HDL 30 (L) 11/30/2015 0237   CHOLHDL 4.2 11/30/2015 0237   VLDL 17 11/30/2015 0237   LDLCALC 79 11/30/2015 0237      Wt Readings from Last 3 Encounters:  12/27/17 258 lb 9.6 oz (117.3 kg)  12/02/16 273 lb 4 oz (123.9 kg)  12/10/15 263 lb 4.8 oz (119.4 kg)      ASSESSMENT AND PLAN:  1.  Coronary artery disease: The patient has stenosis of this anterior descending artery and the vessel was too small for PCI. He continues on medical therapy. He denies any symptoms. He occasionally has some dyspnea with significant exertion, but not in daily activities,  or at work. We'll continue him on his current medication regimen to include metoprolol, isosorbide mononitrate, and secondary prevention with atorvastatin.  2. Hypercholesterolemia: He continues on atorvastatin. He is due to have fasting lab work within the next 2 weeks by PCP. We will request results.  3. Diabetes: Follow by PCP and is currently on metformin  4. Obesity: I advised him if possible that he take a walk every day, and avoid high carbohydrate diet, and fatty foods. Due to his work schedule to be, a struggle for him to exercise.     Current medicines are reviewed at length with the patient today.    Labs/ tests ordered  today include: None  Bettey Mare. Liborio Nixon, ANP, AACC   12/27/2017 5:34 PM    [facility] [address] [place], [place] [address] Phone: [phone]; Fax: [phone]

## 2017-12-27 NOTE — Patient Instructions (Signed)
Medication Instructions:  NO CHANGES-Your physician recommends that you continue on your current medications as directed. Please refer to the Current Medication list given to you today.  If you need a refill on your cardiac medications before your next appointment, please call your pharmacy.  Labwork: PLEASE HAVE PCP FAX LAB RESULTS TO [phone]. ATTN: MICHELLE/DR LAWRENCE D.N.P.  Follow-Up: Your physician wants you to follow-up in: 12 MONTHS WITH DR Jens SomRENSHAW You should receive a reminder letter in the mail two months in advance. If you do not receive a letter, please call our office [phone] to schedule the 12-2018 follow-up appointment.   Thank you for choosing [facility]!!

## 2018-12-24 ENCOUNTER — Other Ambulatory Visit: Payer: Self-pay | Admitting: *Deleted

## 2018-12-24 MED ORDER — METOPROLOL TARTRATE 25 MG PO TABS: ORAL_TABLET | ORAL | 0 refills | Status: DC

## 2019-01-10 ENCOUNTER — Other Ambulatory Visit: Payer: Self-pay | Admitting: *Deleted

## 2019-01-10 ENCOUNTER — Other Ambulatory Visit: Payer: Self-pay | Admitting: Cardiology

## 2019-01-10 DIAGNOSIS — I2510 Atherosclerotic heart disease of native coronary artery without angina pectoris: Secondary | ICD-10-CM

## 2019-01-10 MED ORDER — METOPROLOL TARTRATE 25 MG PO TABS: 12.5000 mg | ORAL_TABLET | Freq: Two times a day (BID) | ORAL | 0 refills | Status: DC

## 2019-01-10 MED ORDER — ISOSORBIDE MONONITRATE ER 60 MG PO TB24: 60.0000 mg | ORAL_TABLET | Freq: Every day | ORAL | 0 refills | Status: DC

## 2019-01-10 NOTE — Telephone Encounter (Signed)
New Message    *STAT* If patient is at the pharmacy, call can be transferred to refill team.   1. Which medications need to be refilled? (please list name of each medication and dose if known) Isosorbide mononitrate 60 mg  Metoprolol 25mg   2. Which pharmacy/location (including street and city if local pharmacy) is medication to be sent to? [facility] [place]   3. Do they need a 30 day or 90 day supply? 90 day

## 2019-01-10 NOTE — Telephone Encounter (Signed)
Rx sent to patient's pharmacy as requested.

## 2019-04-04 NOTE — Progress Notes (Signed)
Virtual Visit via Video Note changed to phone visit at patient request as no smart phone.   This visit type was conducted due to national recommendations for restrictions regarding the COVID-19 Pandemic (e.g. social distancing) in an effort to limit this patient's exposure and mitigate transmission in our community.  Due to his co-morbid illnesses, this patient is at least at moderate risk for complications without adequate follow up.  This format is felt to be most appropriate for this patient at this time.  All issues noted in this document were discussed and addressed.  A limited physical exam was performed with this format.  Please refer to the patient's chart for his consent to telehealth for [facility].   Date:  04/05/2019   ID:  Jacob Downs, DOB [birth year], MRN [id number]  Patient Location: Home Provider Location: Home  PCP:  Catha GosselinLittle, Kevin, MD  Cardiologist: Dr Jens Somrenshaw  Evaluation Performed:  Follow-Up Visit  Chief Complaint:  FU CAD  History of Present Illness:    FU CAD. Admitted with UA 1/17. Cardiac cath 1/17 showed 99 LAD; small in caliber after lesion (PCI not possible) and medical therapy recommended; nonobstructive disease in RCA and Lcx; mild LV dysfunction. Echocardiogram January 2017 showed ejection fraction 50-55% and grade 1 diastolic dysfunction. Since last seen, the patient denies any dyspnea on exertion, orthopnea, PND, pedal edema, palpitations, syncope or chest pain.   The patient does not have symptoms concerning for COVID-19 infection (fever, chills, cough, or new shortness of breath).    Past Medical History:  Diagnosis Date  . Arthritis   . Cellulitis of left hand 11/27/2015  . Diabetes mellitus without complication (HCC)   . GERD (gastroesophageal reflux disease)   . Hyperlipemia   . Shingles    4-5 years ago  . Tuberculosis    As a child   Past Surgical History:  Procedure Laterality Date  . CARDIAC CATHETERIZATION N/A 11/30/2015   Procedure: Left Heart Cath and Coronary Angiography;  Surgeon: Kathleene Hazelhristopher D McAlhany, MD;  [facility];  Service: Cardiovascular;  Laterality: N/A;  . COLONOSCOPY    . I&D EXTREMITY Right 11/26/2014   Procedure: INCISION AND DRAINAGE OF RIGHT INDEX FINGER;  Surgeon: Dairl PonderMatthew Weingold, MD;  Location: [facility] OR;  Service: Orthopedics;  Laterality: Right;  . ORIF ANKLE FRACTURE  10/03/2012   Procedure: OPEN REDUCTION INTERNAL FIXATION (ORIF) ANKLE FRACTURE;  Surgeon: Nestor LewandowskyFrank J Rowan, MD;  Location: [facility];  Service: Orthopedics;  Laterality: Left;  . PLANTAR FASCIA SURGERY  06,07   both feet  . TONSILLECTOMY     as a child     Current Meds  Medication Sig  . aspirin 81 MG tablet Take 81 mg by mouth daily.  Marland Kitchen. atorvastatin (LIPITOR) 80 MG tablet Take 1 tablet (80 mg total) by mouth every evening.  . famotidine (PEPCID) 20 MG tablet Take 20 mg by mouth as needed for heartburn.   . isosorbide mononitrate (IMDUR) 60 MG 24 hr tablet Take 1 tablet (60 mg total) by mouth daily. Must keep upcoming appointment for continuation of refills  . JARDIANCE 25 MG TABS tablet   . metFORMIN (GLUCOPHAGE) 500 MG tablet Take 500-1,000 mg by mouth 2 (two) times daily with a meal. Take 2 tablets in the morning Take 1 tablet in the evening  . metoprolol tartrate (LOPRESSOR) 25 MG tablet Take 0.5 tablets (12.5 mg total) by mouth 2 (two) times daily. Take one-half(1/2) tablet twice a day  . nitroGLYCERIN (  NITROSTAT) 0.4 MG SL tablet Place 1 tablet (0.4 mg total) under the tongue every 5 (five) minutes x 3 doses as needed for chest pain.     Allergies:   Patient has no known allergies.   Social History   Tobacco Use  . Smoking status: Never Smoker  . Smokeless tobacco: Never Used  Substance Use Topics  . Alcohol use: Yes    Alcohol/week: 0.0 standard drinks    Comment: 6 to 12 beers per week  . Drug use: No     Family Hx: The patient's family history includes CAD in his unknown  relative. There is no history of Colon cancer.  ROS:   Please see the history of present illness.    No fevers, chills or productive cough. All other systems reviewed and are negative.   Recent Lipid Panel Lab Results  Component Value Date/Time   CHOL 126 11/30/2015 02:37 AM   TRIG 84 11/30/2015 02:37 AM   HDL 30 (L) 11/30/2015 02:37 AM   CHOLHDL 4.2 11/30/2015 02:37 AM   LDLCALC 79 11/30/2015 02:37 AM    Wt Readings from Last 3 Encounters:  04/05/19 248 lb (112.5 kg)  12/27/17 258 lb 9.6 oz (117.3 kg)  12/02/16 273 lb 4 oz (123.9 kg)     Objective:    Vital Signs:  Ht 6' (1.829 m)   Wt 248 lb (112.5 kg)   BMI 33.63 kg/m    VITAL SIGNS:  reviewed  No acute distress Answers questions appropriately Normal affect Remainder of physical examination not performed.  Telehealth visit; coronavirus pandemic.  ASSESSMENT & PLAN:    1. Coronary artery disease-patient denies chest pain.  Plan to continue medical therapy with aspirin and statin.  Continue nitrates and beta-blocker. 2. Hyperlipidemia-continue statin.  Lipids and liver monitored by primary care. 3. Hypertension-patient's blood pressure is controlled.  Continue present medications and follow.  COVID-19 Education: The importance of social distancing was discussed today.  Time:   Today, I have spent 10 minutes with the patient with telehealth technology discussing the above problems.     Medication Adjustments/Labs and Tests Ordered: Current medicines are reviewed at length with the patient today.  Concerns regarding medicines are outlined above.   Tests Ordered: No orders of the defined types were placed in this encounter.   Medication Changes: No orders of the defined types were placed in this encounter.   Disposition:  Follow up in 1 year(s)  Signed, Olga Millers, MD  04/05/2019 4:03 PM    [facility]

## 2019-04-05 ENCOUNTER — Encounter

## 2019-04-05 ENCOUNTER — Encounter: Payer: Self-pay | Admitting: Cardiology

## 2019-04-05 ENCOUNTER — Telehealth (INDEPENDENT_AMBULATORY_CARE_PROVIDER_SITE_OTHER): Payer: BLUE CROSS/BLUE SHIELD | Admitting: Cardiology

## 2019-04-05 VITALS — Ht 72.0 in | Wt 248.0 lb

## 2019-04-05 DIAGNOSIS — E7800 Pure hypercholesterolemia, unspecified: Secondary | ICD-10-CM

## 2019-04-05 DIAGNOSIS — I10 Essential (primary) hypertension: Secondary | ICD-10-CM

## 2019-04-05 DIAGNOSIS — I2510 Atherosclerotic heart disease of native coronary artery without angina pectoris: Secondary | ICD-10-CM

## 2019-04-05 MED ORDER — METOPROLOL TARTRATE 25 MG PO TABS: 12.5000 mg | ORAL_TABLET | Freq: Two times a day (BID) | ORAL | 3 refills | Status: DC

## 2019-04-05 MED ORDER — ISOSORBIDE MONONITRATE ER 60 MG PO TB24: 60.0000 mg | ORAL_TABLET | Freq: Every day | ORAL | 3 refills | Status: DC

## 2019-04-05 MED ORDER — ATORVASTATIN CALCIUM 80 MG PO TABS: 80.0000 mg | ORAL_TABLET | Freq: Every evening | ORAL | 3 refills | Status: DC

## 2019-04-05 MED ORDER — NITROGLYCERIN 0.4 MG SL SUBL: 0.4000 mg | SUBLINGUAL_TABLET | SUBLINGUAL | 12 refills | Status: DC | PRN

## 2019-04-05 NOTE — Patient Instructions (Addendum)
Medication Instructions:  Refill sent to the pharmacy electronically.  If you need a refill on your cardiac medications before your next appointment, please call your pharmacy.   Lab work: If you have labs (blood work) drawn today and your tests are completely normal, you will receive your results only by: . MyChart Message (if you have MyChart) OR . A paper copy in the mail If you have any lab test that is abnormal or we need to change your treatment, we will call you to review the results.  Follow-Up: At [facility], you and your health needs are our priority.  As part of our continuing mission to provide you with exceptional heart care, we have created designated Provider Care Teams.  These Care Teams include your primary Cardiologist (physician) and Advanced Practice Providers (APPs -  Physician Assistants and Nurse Practitioners) who all work together to provide you with the care you need, when you need it. You will need a follow up appointment in 12 months.  Please call our office 2 months in advance to schedule this appointment.  You may see BRIAN CRENSHAW MD or one of the following Advanced Practice Providers on your designated Care Team:   Luke Kilroy, PA-C Krista Kroeger, PA-C . Callie Goodrich, PA-C     

## 2019-09-02 ENCOUNTER — Telehealth: Payer: Self-pay | Admitting: Cardiology

## 2019-09-02 NOTE — Telephone Encounter (Signed)
Pt c/o of Chest Pain: STAT if CP now or developed within 24 hours  1. Are you having CP right now? Saturday, Sunday, this morning had some, but not as bad as yesterday  2. Are you experiencing any other symptoms (ex. SOB, nausea, vomiting, sweating)? no  3. How long have you been experiencing CP? Started this weekend  4. Is your CP continuous or coming and going?  coming and going  5. Have you taken Nitroglycerin?  She does not know if he took any- Pt wants to be seen?

## 2019-09-04 NOTE — Progress Notes (Deleted)
{Choose 1 Note Type (Telehealth Visit or Telephone Visit):[phone]}   Date:  09/04/2019   ID:  Jacob Downs, DOB [birth year], MRN [id number]  {Patient Location:[phone]::"Home"} {Provider [phone]::"Home"}  PCP:  Catha Gosselin, MD  Cardiologist: Dr.Crenshaw  Electrophysiologist:  None   Evaluation Performed:  {Choose Visit Type:[phone]::"Follow-Up Visit"}  Chief Complaint:  ***  History of Present Illness:    Jacob Downs is a   The patient {does/does not:200015} have symptoms concerning for COVID-19 infection (fever, chills, cough, or new shortness of breath).    Past Medical History:  Diagnosis Date  . Arthritis   . Cellulitis of left hand 11/27/2015  . Diabetes mellitus without complication (HCC)   . GERD (gastroesophageal reflux disease)   . Hyperlipemia   . Shingles    4-5 years ago  . Tuberculosis    As a child   Past Surgical History:  Procedure Laterality Date  . CARDIAC CATHETERIZATION N/A 11/30/2015   Procedure: Left Heart Cath and Coronary Angiography;  Surgeon: Kathleene Hazel, MD;  Location: [facility] INVASIVE CV LAB;  Service: Cardiovascular;  Laterality: N/A;  . COLONOSCOPY    . I&D EXTREMITY Right 11/26/2014   Procedure: INCISION AND DRAINAGE OF RIGHT INDEX FINGER;  Surgeon: Dairl Ponder, MD;  Location: [facility];  Service: Orthopedics;  Laterality: Right;  . ORIF ANKLE FRACTURE  10/03/2012   Procedure: OPEN REDUCTION INTERNAL FIXATION (ORIF) ANKLE FRACTURE;  Surgeon: Nestor Lewandowsky, MD;  Location: [facility];  Service: Orthopedics;  Laterality: Left;  . PLANTAR FASCIA SURGERY  06,07   both feet  . TONSILLECTOMY     as a child     No outpatient medications have been marked as taking for the 09/05/19 encounter (Appointment) with Jodelle Gross, NP.     Allergies:   Patient has no known allergies.   Social History   Tobacco Use  . Smoking status: Never Smoker  . Smokeless tobacco: Never Used  Substance  Use Topics  . Alcohol use: Yes    Alcohol/week: 0.0 standard drinks    Comment: 6 to 12 beers per week  . Drug use: No     Family Hx: The patient's family history includes CAD in his unknown relative. There is no history of Colon cancer.  ROS:   Please see the history of present illness.    *** All other systems reviewed and are negative.   Prior CV studies:   The following studies were reviewed today:  ***  Labs/Other Tests and Data Reviewed:    EKG:  {EKG/Telemetry Strips Reviewed:[phone]}  Recent Labs: No results found for requested labs within last 8760 hours.   Recent Lipid Panel Lab Results  Component Value Date/Time   CHOL 126 11/30/2015 02:37 AM   TRIG 84 11/30/2015 02:37 AM   HDL 30 (L) 11/30/2015 02:37 AM   CHOLHDL 4.2 11/30/2015 02:37 AM   LDLCALC 79 11/30/2015 02:37 AM    Wt Readings from Last 3 Encounters:  04/05/19 248 lb (112.5 kg)  12/27/17 258 lb 9.6 oz (117.3 kg)  12/02/16 273 lb 4 oz (123.9 kg)     Objective:    Vital Signs:  There were no vitals taken for this visit.   {HeartCare Virtual Exam (Optional):[phone]::"VITAL SIGNS:  reviewed"}  ASSESSMENT & PLAN:    1. ***  COVID-19 Education: The signs and symptoms of COVID-19 were discussed with the patient and how to seek care for testing (follow up with PCP or arrange E-visit).  ***The importance  of social distancing was discussed today.  Time:   Today, I have spent *** minutes with the patient with telehealth technology discussing the above problems.     Medication Adjustments/Labs and Tests Ordered: Current medicines are reviewed at length with the patient today.  Concerns regarding medicines are outlined above.   Tests Ordered: No orders of the defined types were placed in this encounter.   Medication Changes: No orders of the defined types were placed in this encounter.   Disposition:  Follow up {follow up:15908}  Signed, Phill Myron. West Pugh, ANP, AACC   09/04/2019 8:20 AM    [facility]

## 2019-09-04 NOTE — Telephone Encounter (Signed)
Spoke to patient's wife.She stated husband is feeling better he has occasional chest heaviness.Stated this past weekend he was having chest heaviness more frequent.He did not take NTG.Appointment scheduled with Kerin Ransom PA  09/10/19 at 2:45 pm.Advised to go to ED if needed.

## 2019-09-04 NOTE — Telephone Encounter (Signed)
Returned call to patient's wife no answer.LMTC. 

## 2019-09-05 ENCOUNTER — Ambulatory Visit: Payer: BLUE CROSS/BLUE SHIELD | Admitting: Adult Health

## 2019-09-10 ENCOUNTER — Ambulatory Visit: Payer: BC Managed Care – PPO | Admitting: Cardiology

## 2019-09-27 ENCOUNTER — Ambulatory Visit: Payer: BC Managed Care – PPO | Admitting: Cardiology

## 2020-04-09 ENCOUNTER — Other Ambulatory Visit: Payer: Self-pay | Admitting: Cardiology

## 2020-04-09 DIAGNOSIS — I2510 Atherosclerotic heart disease of native coronary artery without angina pectoris: Secondary | ICD-10-CM

## 2020-04-09 MED ORDER — ISOSORBIDE MONONITRATE ER 60 MG PO TB24: 60.0000 mg | ORAL_TABLET | Freq: Every day | ORAL | 0 refills | Status: DC

## 2020-04-09 NOTE — Telephone Encounter (Signed)
*  STAT* If patient is at the pharmacy, call can be transferred to refill team.   1. Which medications need to be refilled? (please list name of each medication and dose if known)  isosorbide mononitrate (IMDUR) 60 MG 24 hr tablet  2. Which pharmacy/location (including street and city if local pharmacy) is medication to be sent to? [id number] - [place], [place] - [address]  3. Do they need a 30 day or 90 day supply? 90 day supply    Patient is completely out of medication. He has an appointment scheduled for 04/28/20.

## 2020-04-23 ENCOUNTER — Other Ambulatory Visit: Payer: Self-pay | Admitting: *Deleted

## 2020-04-23 MED ORDER — NITROGLYCERIN 0.4 MG SL SUBL: 0.4000 mg | SUBLINGUAL_TABLET | SUBLINGUAL | 1 refills | Status: DC | PRN

## 2020-04-23 NOTE — Telephone Encounter (Signed)
Rx has been sent to the pharmacy electronically. ° °

## 2020-04-28 ENCOUNTER — Ambulatory Visit: Payer: BC Managed Care – PPO | Admitting: Cardiology

## 2020-05-08 ENCOUNTER — Other Ambulatory Visit: Payer: Self-pay

## 2020-05-29 ENCOUNTER — Other Ambulatory Visit: Payer: Self-pay

## 2020-05-29 MED ORDER — NITROGLYCERIN 0.4 MG SL SUBL: 0.4000 mg | SUBLINGUAL_TABLET | SUBLINGUAL | 0 refills | Status: DC | PRN

## 2020-06-03 ENCOUNTER — Other Ambulatory Visit: Payer: Self-pay | Admitting: Cardiology

## 2020-06-10 ENCOUNTER — Other Ambulatory Visit: Payer: Self-pay

## 2020-06-10 ENCOUNTER — Encounter: Payer: Self-pay | Admitting: Adult Health

## 2020-06-10 ENCOUNTER — Ambulatory Visit (INDEPENDENT_AMBULATORY_CARE_PROVIDER_SITE_OTHER): Payer: BC Managed Care – PPO | Admitting: Adult Health

## 2020-06-10 VITALS — BP 130/82 | HR 75 | Ht 72.0 in | Wt 255.0 lb

## 2020-06-10 DIAGNOSIS — I2510 Atherosclerotic heart disease of native coronary artery without angina pectoris: Secondary | ICD-10-CM

## 2020-06-10 DIAGNOSIS — I10 Essential (primary) hypertension: Secondary | ICD-10-CM | POA: Diagnosis not present

## 2020-06-10 DIAGNOSIS — E7800 Pure hypercholesterolemia, unspecified: Secondary | ICD-10-CM | POA: Diagnosis not present

## 2020-06-10 MED ORDER — ISOSORBIDE MONONITRATE ER 60 MG PO TB24: 60.0000 mg | ORAL_TABLET | Freq: Every day | ORAL | 3 refills | Status: DC

## 2020-06-10 MED ORDER — METOPROLOL TARTRATE 25 MG PO TABS: 12.5000 mg | ORAL_TABLET | Freq: Two times a day (BID) | ORAL | 3 refills | Status: DC

## 2020-06-10 NOTE — Progress Notes (Signed)
Cardiology Office Note   Date:  06/10/2020   ID:  ZACHARI ALBERTA, DOB October 20, 1961, MRN [id number]  PCP:  Catha Gosselin, MD  Cardiologist:  Winfield Rast  No chief complaint on file.    History of Present Illness: Jacob Downs is a 59 y.o. male who presents for ongoing assessment and management of coronary artery disease, with cardiac cath in 2017 revealing a 99% LAD, small in caliber after the lesion and therefore PCI was not possible.  Medical therapy was recommended for nonobstructive disease in the RCA and left circumflex with mild LV dysfunction.  Echocardiogram in 2017 revealed ejection fraction 50 to 55% and grade 1 diastolic dysfunction.  Was last seen by Dr. Jens Som on 04/05/2019 and was without complaints concerning chest pain, dyspnea on exertion, orthopnea, edema, or palpitations.  He was continued on medical therapy.  Lipids LFTs and kidney function were followed by his PCP Dr. Catha Gosselin.  He comes today without any complaints.  No chest pain, dyspnea on exertion, fatigue, dizziness, PND or orthopnea.  He has received his first dose of COVID vaccine approximately 1 month ago and is due to have his second dose in 2 days.  He is medically compliant.  He continues to follow-up with his primary care physician.  He requests refills on isosorbide mononitrate and metoprolol 25 mg 1/2 tablet twice daily.   Past Medical History:  Diagnosis Date   Arthritis    Cellulitis of left hand 11/27/2015   Diabetes mellitus without complication (HCC)    GERD (gastroesophageal reflux disease)    Hyperlipemia    Shingles    4-5 years ago   Tuberculosis    As a child    Past Surgical History:  Procedure Laterality Date   CARDIAC CATHETERIZATION N/A 11/30/2015   Procedure: Left Heart Cath and Coronary Angiography;  Surgeon: Kathleene Hazel, MD;  Location: [facility] INVASIVE CV LAB;  Service: Cardiovascular;  Laterality: N/A;   COLONOSCOPY     I & D EXTREMITY Right 11/26/2014    Procedure: INCISION AND DRAINAGE OF RIGHT INDEX FINGER;  Surgeon: Dairl Ponder, MD;  Location: [facility] OR;  Service: Orthopedics;  Laterality: Right;   ORIF ANKLE FRACTURE  10/03/2012   Procedure: OPEN REDUCTION INTERNAL FIXATION (ORIF) ANKLE FRACTURE;  Surgeon: Nestor Lewandowsky, MD;  Location: [facility];  Service: Orthopedics;  Laterality: Left;   PLANTAR FASCIA SURGERY  06,07   both feet   TONSILLECTOMY     as a child     Current Outpatient Medications  Medication Sig Dispense Refill   aspirin 81 MG tablet Take 81 mg by mouth daily.     atorvastatin (LIPITOR) 80 MG tablet Take 1 tablet (80 mg total) by mouth every evening. 90 tablet 3   famotidine (PEPCID) 20 MG tablet Take 20 mg by mouth as needed for heartburn.      isosorbide mononitrate (IMDUR) 60 MG 24 hr tablet Take 1 tablet (60 mg total) by mouth daily. 90 tablet 3   JARDIANCE 25 MG TABS tablet      metFORMIN (GLUCOPHAGE) 500 MG tablet Take 500-1,000 mg by mouth 2 (two) times daily with a meal. Take 2 tablets in the morning Take 1 tablet in the evening     metoprolol tartrate (LOPRESSOR) 25 MG tablet Take 0.5 tablets (12.5 mg total) by mouth 2 (two) times daily. 90 tablet 3   nitroGLYCERIN (NITROSTAT) 0.4 MG SL tablet DISSOLVE 1 TABLET UNDER THE TONGUE EVERY 5 MINUTES FOR 3 DOSES  AS NEEDED FOR CHEST PAIN 25 tablet 0   No current facility-administered medications for this visit.    Allergies:   Patient has no known allergies.    Social History:  The patient  reports that he has never smoked. He has never used smokeless tobacco. He reports current alcohol use. He reports that he does not use drugs.   Family History:  The patient's family history includes CAD in his unknown relative.    ROS: All other systems are reviewed and negative. Unless otherwise mentioned in H&P    PHYSICAL EXAM: VS:  BP 130/82    Pulse 75    Ht 6' (1.829 m)    Wt 255 lb (115.7 kg)    SpO2 96%    BMI 34.58 kg/m  , BMI Body  mass index is 34.58 kg/m. GEN: Well nourished, well developed, in no acute distress HEENT: normal Neck: no JVD, carotid bruits, or masses Cardiac: RRR; no murmurs, rubs, or gallops,no edema  Respiratory:  Clear to auscultation bilaterally, normal work of breathing GI: soft, nontender, nondistended, + BS MS: no deformity or atrophy Skin: warm and dry, no rash Neuro:  Strength and sensation are intact Psych: euthymic mood, full affect   EKG: (Personally reviewed) normal sinus rhythm, T wave inversion in lead III and aVF.  Unchanged since prior EKG 11/2017.  Heart rate of 75 bpm.   Recent Labs: No results found for requested labs within last 8760 hours.    Lipid Panel    Component Value Date/Time   CHOL 126 12/15/2015 0237   TRIG 84 12-15-15 0237   HDL 30 (L) 12-15-15 0237   CHOLHDL 4.2 12/15/2015 0237   VLDL 17 12-15-15 0237   LDLCALC 79 December 15, 2015 0237      Wt Readings from Last 3 Encounters:  06/10/20 255 lb (115.7 kg)  04/05/19 248 lb (112.5 kg)  12/27/17 258 lb 9.6 oz (117.3 kg)      Other studies Reviewed: Echocardiogram 12/15/15  1. Severe single vessel CAD 2. The LAD is moderate in caliber proximally and is heavily calcified. The vessel quickly becomes small in caliber beyond the takeoff of the first diagonal branch. There is a 99% stenosis in the mid LAD just beyond the diagonal branch. The vessel is very small and diffusely diseased beyond the severe stenosis. The LAD is too small for PCI/stenting. The Diagonal branch is small in caliber with moderate proximal stenosis.  3. The RCA and Circumflex has mild non-obstructive disease.  4. Mild segmental LV systolic dysfunction.   Recommendations: His LAD is the likely culprit but it is a small, diffusely diseased vessel and not a favorable target for PCI/stenting. Will recommend medical management. Will start Imdur. Continue ASA, beta blocker and statin.   Echocardiogram 12/15/2015  Left ventricle: The  cavity size was normal. Wall thickness was  normal. Systolic function was normal. The estimated ejection  fraction was in the range of 50% to 55%. Doppler parameters are  consistent with abnormal left ventricular relaxation (grade 1  diastolic dysfunction).  - Right ventricle: Endocardium of RV free wall is difficlult to  see. No subcostal views. Overall RVEF is normal to mildly  reduced.   ASSESSMENT AND PLAN:  1.  Coronary artery disease: History of most recent catheterization in 2017 with 99% LAD, small in caliber after the lesion therefore PCI was not possible.  He was continued on medical therapy for nonobstructive disease in the RCA and left circumflex.  He is without any symptoms  of chest discomfort or dyspnea.  He will continue on isosorbide mononitrate 60 mg daily, aspirin, statin therapy, and beta-blocker.  Labs are completed by PCP.  2.  Hyperlipidemia: Goal of LDL less than 70.  He is currently on atorvastatin 80 mg daily with labs followed by primary care physician Dr. Clarene Duke.  3.  Hypertension: Blood pressure is well controlled today.  No changes in his regimen.  4.  Type 2 diabetes: Followed by Dr. Clarene Duke, remains on Jardiance and Metformin.   Current medicines are reviewed at length with the patient today.  I have spent 20 minutes dedicated to the care of this patient on the date of this encounter to include pre-visit review of records, assessment, management and diagnostic testing,with shared decision making.  Labs/ tests ordered today include: None  Jacob Downs, ANP, AACC   06/10/2020 4:32 PM    [facility] [address] Office [phone] Fax [phone]  Notice: This dictation was prepared with Dragon dictation along with smaller phrase technology. Any transcriptional errors that result from this process are unintentional and may not be corrected upon review.

## 2020-06-10 NOTE — Patient Instructions (Signed)
Medication Instructions:  Continue current medications  *If you need a refill on your cardiac medications before your next appointment, please call your pharmacy*   Lab Work: None Ordered   Testing/Procedures: None Ordered   Follow-Up: At [facility], you and your health needs are our priority.  As part of our continuing mission to provide you with exceptional heart care, we have created designated Provider Care Teams.  These Care Teams include your primary Cardiologist (physician) and Advanced Practice Providers (APPs -  Physician Assistants and Nurse Practitioners) who all work together to provide you with the care you need, when you need it.  We recommend signing up for the patient portal called "MyChart".  Sign up information is provided on this After Visit Summary.  MyChart is used to connect with patients for Virtual Visits (Telemedicine).  Patients are able to view lab/test results, encounter notes, upcoming appointments, etc.  Non-urgent messages can be sent to your provider as well.   To learn more about what you can do with MyChart, go to [email or web address].    Your next appointment:   1 year(s)  The format for your next appointment:   In Person  Provider:   You may see Brian Crenshaw, MD or one of the following Advanced Practice Providers on your designated Care Team:    Luke Kilroy, PA-C  Callie Goodrich, PA-C  Jesse Cleaver, FNP      

## 2020-09-25 ENCOUNTER — Other Ambulatory Visit: Payer: Self-pay | Admitting: Cardiology

## 2020-09-25 DIAGNOSIS — I2510 Atherosclerotic heart disease of native coronary artery without angina pectoris: Secondary | ICD-10-CM

## 2020-09-28 ENCOUNTER — Other Ambulatory Visit: Payer: Self-pay | Admitting: Cardiology

## 2020-09-28 DIAGNOSIS — I2510 Atherosclerotic heart disease of native coronary artery without angina pectoris: Secondary | ICD-10-CM

## 2020-09-28 NOTE — Telephone Encounter (Signed)
*  STAT* If patient is at the pharmacy, call can be transferred to refill team.   1. Which medications need to be refilled? (please list name of each medication and dose if known) atorvastatin (LIPITOR) 80 MG tablet  2. Which pharmacy/location (including street and city if local pharmacy) is medication to be sent to? [id number] - [place], [place] - [address]  3. Do they need a 30 day or 90 day supply? 90 day   Patient's wife states refill was not received by the pharmacy

## 2021-03-08 ENCOUNTER — Other Ambulatory Visit: Payer: Self-pay

## 2021-03-08 ENCOUNTER — Emergency Department (HOSPITAL_COMMUNITY)
Admission: EM | Admit: 2021-03-08 | Discharge: 2021-03-08 | Disposition: A | Discharge status: Home / Self Care | Payer: BC Managed Care – PPO | Attending: Emergency Medicine | Admitting: Emergency Medicine

## 2021-03-08 ENCOUNTER — Emergency Department (HOSPITAL_COMMUNITY): Payer: BC Managed Care – PPO

## 2021-03-08 ENCOUNTER — Encounter (HOSPITAL_COMMUNITY): Payer: Self-pay | Admitting: Emergency Medicine

## 2021-03-08 DIAGNOSIS — R109 Unspecified abdominal pain: Secondary | ICD-10-CM

## 2021-03-08 DIAGNOSIS — I25119 Atherosclerotic heart disease of native coronary artery with unspecified angina pectoris: Secondary | ICD-10-CM | POA: Insufficient documentation

## 2021-03-08 DIAGNOSIS — K219 Gastro-esophageal reflux disease without esophagitis: Secondary | ICD-10-CM | POA: Insufficient documentation

## 2021-03-08 DIAGNOSIS — E119 Type 2 diabetes mellitus without complications: Secondary | ICD-10-CM | POA: Insufficient documentation

## 2021-03-08 DIAGNOSIS — Z7982 Long term (current) use of aspirin: Secondary | ICD-10-CM | POA: Diagnosis not present

## 2021-03-08 DIAGNOSIS — Z7984 Long term (current) use of oral hypoglycemic drugs: Secondary | ICD-10-CM | POA: Insufficient documentation

## 2021-03-08 LAB — CBC WITH DIFFERENTIAL/PLATELET
Abs Immature Granulocytes: 0.03 10*3/uL (ref 0.00–0.07)
Basophils Absolute: 0 10*3/uL (ref 0.0–0.1)
Basophils Relative: 0 %
Eosinophils Absolute: 0.1 10*3/uL (ref 0.0–0.5)
Eosinophils Relative: 1 %
HCT: 47.7 % (ref 39.0–52.0)
Hemoglobin: 16.3 g/dL (ref 13.0–17.0)
Immature Granulocytes: 1 %
Lymphocytes Relative: 30 %
Lymphs Abs: 1.5 10*3/uL (ref 0.7–4.0)
MCH: 32.7 pg (ref 26.0–34.0)
MCHC: 34.2 g/dL (ref 30.0–36.0)
MCV: 95.8 fL (ref 80.0–100.0)
Monocytes Absolute: 0.5 10*3/uL (ref 0.1–1.0)
Monocytes Relative: 9 %
Neutro Abs: 3 10*3/uL (ref 1.7–7.7)
Neutrophils Relative %: 59 %
Platelets: 177 10*3/uL (ref 150–400)
RBC: 4.98 MIL/uL (ref 4.22–5.81)
RDW: 13.5 % (ref 11.5–15.5)
WBC: 5 10*3/uL (ref 4.0–10.5)
nRBC: 0 % (ref 0.0–0.2)

## 2021-03-08 LAB — COMPREHENSIVE METABOLIC PANEL
ALT: 68 U/L — ABNORMAL HIGH (ref 0–44)
AST: 36 U/L (ref 15–41)
Albumin: 4.2 g/dL (ref 3.5–5.0)
Alkaline Phosphatase: 87 U/L (ref 38–126)
Anion gap: 11 (ref 5–15)
BUN: 15 mg/dL (ref 6–20)
CO2: 29 mmol/L (ref 22–32)
Calcium: 9.7 mg/dL (ref 8.9–10.3)
Chloride: 100 mmol/L (ref 98–111)
Creatinine, Ser: 0.73 mg/dL (ref 0.61–1.24)
GFR, Estimated: >60 mL/min (ref >60)
Glucose, Bld: 164 mg/dL — ABNORMAL HIGH (ref 70–99)
Potassium: 4.5 mmol/L (ref 3.5–5.1)
Sodium: 140 mmol/L (ref 135–145)
Total Bilirubin: 0.8 mg/dL (ref 0.3–1.2)
Total Protein: 7.1 g/dL (ref 6.5–8.1)

## 2021-03-08 LAB — URINALYSIS, ROUTINE W REFLEX MICROSCOPIC
Bacteria, UA: NONE SEEN
Bilirubin Urine: NEGATIVE
Glucose, UA: >=500 mg/dL — AB
Hgb urine dipstick: NEGATIVE
Ketones, ur: NEGATIVE mg/dL
Leukocytes,Ua: NEGATIVE
Nitrite: NEGATIVE
Protein, ur: NEGATIVE mg/dL
Specific Gravity, Urine: 1.028 (ref 1.005–1.030)
pH: 5 (ref 5.0–8.0)

## 2021-03-08 LAB — LIPASE, BLOOD: Lipase: 43 U/L (ref 11–51)

## 2021-03-08 MED ORDER — CYCLOBENZAPRINE HCL 10 MG PO TABS: 10.0000 mg | ORAL_TABLET | Freq: Two times a day (BID) | ORAL | 0 refills | Status: DC | PRN

## 2021-03-08 MED ORDER — SODIUM CHLORIDE 0.9 % IV BOLUS
500.0000 mL | Freq: Once | INTRAVENOUS | Status: AC
  Administered 2021-03-08: 500 mL via INTRAVENOUS

## 2021-03-08 MED ORDER — MORPHINE SULFATE (PF) 4 MG/ML IV SOLN
4.0000 mg | Freq: Once | INTRAVENOUS | Status: AC
  Administered 2021-03-08: 4 mg via INTRAVENOUS
  Filled 2021-03-08: qty 1

## 2021-03-08 MED ORDER — IOHEXOL 300 MG/ML  SOLN
100.0000 mL | Freq: Once | INTRAMUSCULAR | Status: AC | PRN
  Administered 2021-03-08: 100 mL via INTRAVENOUS

## 2021-03-08 NOTE — ED Notes (Signed)
Patient transported to CT 

## 2021-03-08 NOTE — ED Triage Notes (Signed)
C/o R lateral side pain since yesterday.  Denies nausea, vomiting, diarrhea, and urinary complaints.

## 2021-03-08 NOTE — Discharge Instructions (Signed)
You may take Tylenol 1000 mg (acetaminophen) up to 4 times a day for 1 week. This is the maximum dose of Tylenol you can take from all sources. Please check other over-the-counter medications and prescriptions to ensure you are not taking other medications that contain acetaminophen.  You may also take ibuprofen 400 mg 6 times a day alternating with or at the same time as tylenol.  You may also take the muscle relaxant prescribed--this will make you sleepy and you should not drive while taking this.

## 2021-03-08 NOTE — ED Provider Notes (Signed)
[facility] Provider Note   CSN: [id number] Arrival date & time: 03/08/21  [id number]     History Chief Complaint  Patient presents with  . Abdominal Pain    Sheldon SilvanSteven R Mcelhiney is a 60 y.o. male.  HPI     60 year old male with history of hyperlipidemia, diabetes, coronary artery disease, who presents with concern for right-sided flank pain.  Pain located right side, severe. Began yesterday and then worsened overnight. Is worse with movements. 10/10. No n/v/d/constipation no hematuria/dysuria. No chest pain or dyspnea.    No numbness, weakness, loss of control of bowel or bladder.  Denies trauma, fevers, history of IV drug use.  Past Medical History:  Diagnosis Date  . Arthritis   . Cellulitis of left hand 11/27/2015  . Diabetes mellitus without complication (HCC)   . GERD (gastroesophageal reflux disease)   . Hyperlipemia   . Shingles    4-5 years ago  . Tuberculosis    As a child    Patient Active Problem List   Diagnosis Date Noted  . Coronary artery disease involving native coronary artery of native heart with unstable angina pectoris (HCC)   . Unstable angina (HCC) 11/29/2015    Past Surgical History:  Procedure Laterality Date  . CARDIAC CATHETERIZATION N/A 11/30/2015   Procedure: Left Heart Cath and Coronary Angiography;  Surgeon: Kathleene Hazelhristopher D McAlhany, MD;  [facility];  Service: Cardiovascular;  Laterality: N/A;  . COLONOSCOPY    . I & D EXTREMITY Right 11/26/2014   Procedure: INCISION AND DRAINAGE OF RIGHT INDEX FINGER;  Surgeon: Dairl PonderMatthew Weingold, MD;  Location: [facility] OR;  Service: Orthopedics;  Laterality: Right;  . ORIF ANKLE FRACTURE  10/03/2012   Procedure: OPEN REDUCTION INTERNAL FIXATION (ORIF) ANKLE FRACTURE;  Surgeon: Nestor LewandowskyFrank J Rowan, MD;  Location: [facility];  Service: Orthopedics;  Laterality: Left;  . PLANTAR FASCIA SURGERY  06,07   both feet  . TONSILLECTOMY     as a child        Family History  Problem Relation Age of Onset  . Colon cancer Neg Hx   . CAD Other        No family history    Social History   Tobacco Use  . Smoking status: Never Smoker  . Smokeless tobacco: Never Used  Substance Use Topics  . Alcohol use: Yes    Alcohol/week: 0.0 standard drinks    Comment: 6 to 12 beers per week  . Drug use: No    Home Medications Prior to Admission medications   Medication Sig Start Date End Date Taking? Authorizing Provider  aspirin 81 MG tablet Take 81 mg by mouth daily.    [provider]  atorvastatin (LIPITOR) 80 MG tablet TAKE 1 TABLET(80 MG) BY MOUTH EVERY EVENING 09/25/20   Lewayne Buntingrenshaw, Brian S, MD  cyclobenzaprine (FLEXERIL) 10 MG tablet Take 1 tablet (10 mg total) by mouth 2 (two) times daily as needed for muscle spasms. 03/08/21   Alvira MondaySchlossman, Chynna Buerkle, MD  famotidine (PEPCID) 20 MG tablet Take 20 mg by mouth as needed for heartburn.     [provider]  isosorbide mononitrate (IMDUR) 60 MG 24 hr tablet Take 1 tablet (60 mg total) by mouth daily. 06/10/20   Jodelle GrossLawrence, Kathryn M, NP  JARDIANCE 25 MG TABS tablet  01/09/19   [provider]  metFORMIN (GLUCOPHAGE) 500 MG tablet Take 500-1,000 mg by mouth 2 (two) times daily with a meal. Take 2  tablets in the morning Take 1 tablet in the evening    [provider]  metoprolol tartrate (LOPRESSOR) 25 MG tablet Take 0.5 tablets (12.5 mg total) by mouth 2 (two) times daily. 06/10/20   Jodelle Gross, NP  nitroGLYCERIN (NITROSTAT) 0.4 MG SL tablet DISSOLVE 1 TABLET UNDER THE TONGUE EVERY 5 MINUTES FOR 3 DOSES AS NEEDED FOR CHEST PAIN 06/04/20   Lewayne Bunting, MD    Allergies    Patient has no known allergies.  Review of Systems   Review of Systems  Constitutional: Negative for fever.  HENT: Negative for sore throat.   Eyes: Negative for visual disturbance.  Respiratory: Negative for shortness of breath.   Cardiovascular: Negative for chest pain.   Gastrointestinal: Positive for abdominal pain (side). Negative for diarrhea, nausea and vomiting.  Genitourinary: Positive for flank pain. Negative for difficulty urinating.  Musculoskeletal: Positive for back pain. Negative for neck stiffness.  Skin: Negative for rash.  Neurological: Negative for syncope and headaches.    Physical Exam Updated Vital Signs BP (!) 142/78 (BP Location: Right Arm)   Pulse 70   Temp 98 F (36.7 C) (Oral)   Resp 20   SpO2 98%   Physical Exam Vitals and nursing note reviewed.  Constitutional:      General: He is not in acute distress.    Appearance: He is well-developed. He is not diaphoretic.  HENT:     Head: Normocephalic and atraumatic.  Eyes:     Conjunctiva/sclera: Conjunctivae normal.  Cardiovascular:     Rate and Rhythm: Normal rate and regular rhythm.     Heart sounds: Normal heart sounds. No murmur heard. No friction rub. No gallop.   Pulmonary:     Effort: Pulmonary effort is normal. No respiratory distress.     Breath sounds: Normal breath sounds. No wheezing or rales.  Abdominal:     General: There is no distension.     Palpations: Abdomen is soft.     Tenderness: There is abdominal tenderness (right flank/lateral right sided tenderness). There is no right CVA tenderness, left CVA tenderness or guarding. Negative signs include Murphy's sign and McBurney's sign.  Musculoskeletal:     Cervical back: Normal range of motion.     Comments: No midline back pain  Skin:    General: Skin is warm and dry.  Neurological:     Mental Status: He is alert and oriented to person, place, and time.     ED Results / Procedures / Treatments   Labs (all labs ordered are listed, but only abnormal results are displayed) Labs Reviewed  COMPREHENSIVE METABOLIC PANEL - Abnormal; Notable for the following components:      Result Value   Glucose, Bld 164 (*)    ALT 68 (*)    All other components within normal limits  URINALYSIS, ROUTINE W REFLEX  MICROSCOPIC - Abnormal; Notable for the following components:   Glucose, UA >=500 (*)    All other components within normal limits  URINE CULTURE  CBC WITH DIFFERENTIAL/PLATELET  LIPASE, BLOOD    EKG None  Radiology CT ABDOMEN PELVIS W CONTRAST  Result Date: 03/08/2021 CLINICAL DATA:  Left-sided abdominal and flank pain. EXAM: CT ABDOMEN AND PELVIS WITH CONTRAST TECHNIQUE: Multidetector CT imaging of the abdomen and pelvis was performed using the standard protocol following bolus administration of intravenous contrast. CONTRAST:  OMNIPAQUE IOHEXOL 300 MG/ML  SOLN COMPARISON:  None. FINDINGS: Lower chest: The lung bases are clear of an acute  process. No pulmonary lesions or pleural effusions. The heart is normal in size. Age advanced three-vessel coronary artery calcifications are noted. Hepatobiliary: Diffuse fatty infiltration of the liver but no focal hepatic lesions or intrahepatic biliary dilatation. The gallbladder is unremarkable. No common bile duct dilatation. Pancreas: No mass, inflammation or ductal dilatation. Spleen: Normal size.  No focal lesions. Adrenals/Urinary Tract: Adrenal glands and kidneys are unremarkable. No renal lesions or hydroureteronephrosis. No ureteral or bladder calculi. The delayed images do not demonstrate any significant collecting system abnormalities. 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 Stomach/Bowel: The stomach, duodenum, small bowel and colon are unremarkable. No acute inflammatory changes, mass lesions or obstructive findings. The terminal ileum is normal. The appendix is normal. Vascular/Lymphatic: The aorta is normal in caliber. No dissection. Moderate age advanced atherosclerotic calcifications. The branch vessels are patent. The major venous structures are patent. No mesenteric or retroperitoneal mass or adenopathy. Small scattered lymph nodes are noted. Reproductive: The prostate gland and seminal vesicles are unremarkable. Other:  No pelvic mass or adenopathy. No free pelvic fluid collections. No inguinal mass or adenopathy. No abdominal wall hernia or subcutaneous lesions. Musculoskeletal: No significant bony findings. IMPRESSION: 1. No acute abdominal/pelvic findings, mass lesions or adenopathy. 2. Diffuse fatty infiltration of the liver. 3. Age advanced atherosclerotic calcifications involving the aorta and coronary arteries. 4. 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 Electronically Signed   By: Rudie Meyer M.D.   On: 03/08/2021 12:22    Procedures Procedures   Medications Ordered in ED Medications  morphine 4 MG/ML injection 4 mg (4 mg Intravenous Given 03/08/21 1014)  sodium chloride 0.9 % bolus 500 mL (0 mLs Intravenous Stopped 03/08/21 1100)  iohexol (OMNIPAQUE) 300 MG/ML solution 100 mL (100 mLs Intravenous Contrast Given 03/08/21 1201)    ED Course  I have reviewed the triage vital signs and the nursing notes.  Pertinent labs & imaging results that were available during my care of the patient were reviewed by me and considered in my medical decision making (see chart for details).    MDM Rules/Calculators/A&P                          60 year old male with history of hyperlipidemia, diabetes, coronary artery disease, who presents with concern for right-sided flank pain. DDx includes appendicitis, pancreatitis, cholecystitis, pyelonephritis, nephrolithiasis, diverticulitis, AAA, muscle strain, renal infarct, herniated disc, shingles.  Given significant tenderness on exam, location, doubt ACS/PE.  Labs without leukocytosis, no anemia, mild elevation of ALT without other abnormalities, no sign of pancreatitis.  CT abdomen pelvis was performed which showed no evidence of cholecystitis, appendicitis, nephrolithiasis, renal infarct or other abnormalities.  Given significant tenderness on exam suspect likely muscular strain, or consider early shingles without rash.  He does not have midline tenderness,  but consider disc herniation.  No red flags to indicate need for emergent surgery or hospitalization.  Given prescription for muscle relaxant and recommend primary care physician follow-up.   Final Clinical Impression(s) / ED Diagnoses Final diagnoses:  Right flank pain    Rx / DC Orders ED Discharge Orders         Ordered    cyclobenzaprine (FLEXERIL) 10 MG tablet  2 times daily PRN,   Status:  Discontinued        03/08/21 1338    cyclobenzaprine (FLEXERIL) 10 MG tablet  2 times daily PRN        03/08/21 1351  Alvira Monday, MD 03/09/21 0700

## 2021-03-09 LAB — URINE CULTURE: Culture: NO GROWTH

## 2021-03-10 ENCOUNTER — Emergency Department (HOSPITAL_BASED_OUTPATIENT_CLINIC_OR_DEPARTMENT_OTHER)
Admission: EM | Admit: 2021-03-10 | Discharge: 2021-03-10 | Disposition: A | Discharge status: Home / Self Care | Payer: BC Managed Care – PPO | Attending: Emergency Medicine | Admitting: Emergency Medicine

## 2021-03-10 ENCOUNTER — Emergency Department (HOSPITAL_BASED_OUTPATIENT_CLINIC_OR_DEPARTMENT_OTHER): Payer: BC Managed Care – PPO

## 2021-03-10 ENCOUNTER — Other Ambulatory Visit: Payer: Self-pay

## 2021-03-10 ENCOUNTER — Encounter (HOSPITAL_BASED_OUTPATIENT_CLINIC_OR_DEPARTMENT_OTHER): Payer: Self-pay

## 2021-03-10 DIAGNOSIS — I25110 Atherosclerotic heart disease of native coronary artery with unstable angina pectoris: Secondary | ICD-10-CM | POA: Diagnosis not present

## 2021-03-10 DIAGNOSIS — S39012D Strain of muscle, fascia and tendon of lower back, subsequent encounter: Secondary | ICD-10-CM | POA: Diagnosis not present

## 2021-03-10 DIAGNOSIS — W108XXD Fall (on) (from) other stairs and steps, subsequent encounter: Secondary | ICD-10-CM | POA: Insufficient documentation

## 2021-03-10 DIAGNOSIS — E1169 Type 2 diabetes mellitus with other specified complication: Secondary | ICD-10-CM | POA: Diagnosis not present

## 2021-03-10 DIAGNOSIS — Z79899 Other long term (current) drug therapy: Secondary | ICD-10-CM | POA: Diagnosis not present

## 2021-03-10 DIAGNOSIS — E785 Hyperlipidemia, unspecified: Secondary | ICD-10-CM | POA: Diagnosis not present

## 2021-03-10 DIAGNOSIS — S3992XD Unspecified injury of lower back, subsequent encounter: Secondary | ICD-10-CM | POA: Diagnosis present

## 2021-03-10 DIAGNOSIS — T148XXA Other injury of unspecified body region, initial encounter: Secondary | ICD-10-CM

## 2021-03-10 DIAGNOSIS — Z7982 Long term (current) use of aspirin: Secondary | ICD-10-CM | POA: Insufficient documentation

## 2021-03-10 LAB — CBC WITH DIFFERENTIAL/PLATELET
Abs Immature Granulocytes: 0.02 10*3/uL (ref 0.00–0.07)
Basophils Absolute: 0 10*3/uL (ref 0.0–0.1)
Basophils Relative: 1 %
Eosinophils Absolute: 0.1 10*3/uL (ref 0.0–0.5)
Eosinophils Relative: 2 %
HCT: 49.5 % (ref 39.0–52.0)
Hemoglobin: 17 g/dL (ref 13.0–17.0)
Immature Granulocytes: 0 %
Lymphocytes Relative: 28 %
Lymphs Abs: 1.4 10*3/uL (ref 0.7–4.0)
MCH: 32.9 pg (ref 26.0–34.0)
MCHC: 34.3 g/dL (ref 30.0–36.0)
MCV: 95.7 fL (ref 80.0–100.0)
Monocytes Absolute: 0.5 10*3/uL (ref 0.1–1.0)
Monocytes Relative: 9 %
Neutro Abs: 3 10*3/uL (ref 1.7–7.7)
Neutrophils Relative %: 60 %
Platelets: 173 10*3/uL (ref 150–400)
RBC: 5.17 MIL/uL (ref 4.22–5.81)
RDW: 13.4 % (ref 11.5–15.5)
WBC: 5 10*3/uL (ref 4.0–10.5)
nRBC: 0 % (ref 0.0–0.2)

## 2021-03-10 LAB — COMPREHENSIVE METABOLIC PANEL
ALT: 93 U/L — ABNORMAL HIGH (ref 0–44)
AST: 68 U/L — ABNORMAL HIGH (ref 15–41)
Albumin: 4.2 g/dL (ref 3.5–5.0)
Alkaline Phosphatase: 85 U/L (ref 38–126)
Anion gap: 11 (ref 5–15)
BUN: 15 mg/dL (ref 6–20)
CO2: 28 mmol/L (ref 22–32)
Calcium: 8.8 mg/dL — ABNORMAL LOW (ref 8.9–10.3)
Chloride: 100 mmol/L (ref 98–111)
Creatinine, Ser: 0.73 mg/dL (ref 0.61–1.24)
GFR, Estimated: >60 mL/min (ref >60)
Glucose, Bld: 174 mg/dL — ABNORMAL HIGH (ref 70–99)
Potassium: 4.7 mmol/L (ref 3.5–5.1)
Sodium: 139 mmol/L (ref 135–145)
Total Bilirubin: 0.8 mg/dL (ref 0.3–1.2)
Total Protein: 7.6 g/dL (ref 6.5–8.1)

## 2021-03-10 LAB — URINALYSIS, ROUTINE W REFLEX MICROSCOPIC
Bilirubin Urine: NEGATIVE
Glucose, UA: >=500 mg/dL — AB
Hgb urine dipstick: NEGATIVE
Ketones, ur: NEGATIVE mg/dL
Leukocytes,Ua: NEGATIVE
Nitrite: NEGATIVE
Protein, ur: NEGATIVE mg/dL
Specific Gravity, Urine: 1.015 (ref 1.005–1.030)
pH: 5 (ref 5.0–8.0)

## 2021-03-10 LAB — URINALYSIS, MICROSCOPIC (REFLEX): Squamous Epithelial / HPF: NONE SEEN (ref 0–5)

## 2021-03-10 MED ORDER — KETOROLAC TROMETHAMINE 15 MG/ML IJ SOLN
15.0000 mg | Freq: Once | INTRAMUSCULAR | Status: AC
  Administered 2021-03-10: 15 mg via INTRAVENOUS
  Filled 2021-03-10: qty 1

## 2021-03-10 MED ORDER — IOHEXOL 300 MG/ML  SOLN
100.0000 mL | Freq: Once | INTRAMUSCULAR | Status: AC | PRN
  Administered 2021-03-10: 100 mL via INTRAVENOUS

## 2021-03-10 MED ORDER — METHOCARBAMOL 500 MG PO TABS: 500.0000 mg | ORAL_TABLET | Freq: Three times a day (TID) | ORAL | 0 refills | Status: DC | PRN

## 2021-03-10 NOTE — ED Provider Notes (Signed)
[facility] EMERGENCY DEPARTMENT Provider Note   CSN: [id number] Arrival date & time: 03/10/21  0815     History Chief Complaint  Patient presents with  . Back Pain    Jacob Downs is a 60 y.o. male with type 2 diabetes, GERD, hx shingles presenting to emergency department with lower right back pain. Describes the pain as sharp, radiating to anterior abdomen. Patient states his symptoms started on Sunday. He does report a fall last week where he was going down stairs, missed a step, and fell on his knees. Denies hitting his back, syncope, LOC. Reports he did not have pain right after the fall. Denies dysuria, urinary frequency, weakness/tingling in legs, hx back surgery, urinary/bowel incontinence, or IV drug use. Patient was seen by [facility] two days ago for similar symptoms. At that time work-up was negative for infection, thought to be 2/2 muscular strain vs early shingles. Patient mentions he has been taking Advil and prescribed Flexiril with no relief of symptoms.     Past Medical History:  Diagnosis Date  . Arthritis   . Cellulitis of left hand 11/27/2015  . Diabetes mellitus without complication (HCC)   . GERD (gastroesophageal reflux disease)   . Hyperlipemia   . Shingles    4-5 years ago  . Tuberculosis    As a child    Patient Active Problem List   Diagnosis Date Noted  . Coronary artery disease involving native coronary artery of native heart with unstable angina pectoris (HCC)   . Unstable angina (HCC) 11/29/2015    Past Surgical History:  Procedure Laterality Date  . CARDIAC CATHETERIZATION N/A 11/30/2015   Procedure: Left Heart Cath and Coronary Angiography;  Surgeon: Kathleene Hazel, MD;  Location: [facility] INVASIVE CV LAB;  Service: Cardiovascular;  Laterality: N/A;  . COLONOSCOPY    . I & D EXTREMITY Right 11/26/2014   Procedure: INCISION AND DRAINAGE OF RIGHT INDEX FINGER;  Surgeon: Dairl Ponder, MD;  Location: [facility] OR;  Service: Orthopedics;   Laterality: Right;  . ORIF ANKLE FRACTURE  10/03/2012   Procedure: OPEN REDUCTION INTERNAL FIXATION (ORIF) ANKLE FRACTURE;  Surgeon: Nestor Lewandowsky, MD;  Location: [facility];  Service: Orthopedics;  Laterality: Left;  . PLANTAR FASCIA SURGERY  06,07   both feet  . TONSILLECTOMY     as a child       Family History  Problem Relation Age of Onset  . Colon cancer Neg Hx   . CAD Other        No family history    Social History   Tobacco Use  . Smoking status: Never Smoker  . Smokeless tobacco: Never Used  Substance Use Topics  . Alcohol use: Yes    Alcohol/week: 0.0 standard drinks    Comment: 6 to 12 beers per week  . Drug use: No    Home Medications Prior to Admission medications   Medication Sig Start Date End Date Taking? Authorizing Provider  aspirin 81 MG tablet Take 81 mg by mouth daily.    [provider]  atorvastatin (LIPITOR) 80 MG tablet TAKE 1 TABLET(80 MG) BY MOUTH EVERY EVENING 09/25/20   Lewayne Bunting, MD  cyclobenzaprine (FLEXERIL) 10 MG tablet Take 1 tablet (10 mg total) by mouth 2 (two) times daily as needed for muscle spasms. 03/08/21   Alvira Monday, MD  famotidine (PEPCID) 20 MG tablet Take 20 mg by mouth as needed for heartburn.     [provider]  isosorbide mononitrate (IMDUR) 60 MG 24 hr tablet Take 1 tablet (60 mg total) by mouth daily. 06/10/20   Jodelle Gross, NP  JARDIANCE 25 MG TABS tablet  01/09/19   [provider]  metFORMIN (GLUCOPHAGE) 500 MG tablet Take 500-1,000 mg by mouth 2 (two) times daily with a meal. Take 2 tablets in the morning Take 1 tablet in the evening    [provider]  metoprolol tartrate (LOPRESSOR) 25 MG tablet Take 0.5 tablets (12.5 mg total) by mouth 2 (two) times daily. 06/10/20   Jodelle Gross, NP  nitroGLYCERIN (NITROSTAT) 0.4 MG SL tablet DISSOLVE 1 TABLET UNDER THE TONGUE EVERY 5 MINUTES FOR 3 DOSES AS NEEDED FOR CHEST PAIN 06/04/20   Lewayne Bunting,  MD    Allergies    Patient has no known allergies.  Review of Systems   Review of Systems  Constitutional: Negative for appetite change and fever.  Respiratory: Negative for chest tightness and shortness of breath.   Cardiovascular: Negative for chest pain.  Gastrointestinal: Negative for abdominal pain, blood in stool, diarrhea, nausea and vomiting.  Genitourinary: Positive for flank pain. Negative for dysuria and urgency.  Skin: Negative for rash.  Neurological: Negative for syncope, weakness and numbness.    Physical Exam Updated Vital Signs BP 137/90 (BP Location: Right Arm)   Pulse 77   Temp 98 F (36.7 C) (Oral)   Resp 18   Ht 6' (1.829 m)   Wt 115.7 kg   SpO2 96%   BMI 34.58 kg/m   Physical Exam Constitutional:      General: He is awake. He is not in acute distress.    Appearance: He is overweight. He is not ill-appearing, toxic-appearing or diaphoretic.  HENT:     Head: Normocephalic and atraumatic.  Cardiovascular:     Rate and Rhythm: Normal rate and regular rhythm.     Heart sounds: Normal heart sounds.  Pulmonary:     Effort: Pulmonary effort is normal.     Breath sounds: Normal breath sounds.  Abdominal:     General: Bowel sounds are normal.     Palpations: Abdomen is soft.     Tenderness: There is no abdominal tenderness.  Musculoskeletal:       Back:     Comments: Tender to palpation on R lower back. Worsening with any movement. No CVA tenderness bilaterally. No rash, erythema, swelling appreciated. No spinal bony tenderness or paraspinal tenderness.  Skin:    General: Skin is warm and dry.  Neurological:     General: No focal deficit present.     Mental Status: He is alert and oriented to person, place, and time.  Psychiatric:        Mood and Affect: Mood normal.        Speech: Speech normal.        Behavior: Behavior is cooperative.     ED Results / Procedures / Treatments   Labs (all labs ordered are listed, but only abnormal results  are displayed) Labs Reviewed  COMPREHENSIVE METABOLIC PANEL - Abnormal; Notable for the following components:      Result Value   Glucose, Bld 174 (*)    Calcium 8.8 (*)    AST 68 (*)    ALT 93 (*)    All other components within normal limits  URINALYSIS, ROUTINE W REFLEX MICROSCOPIC - Abnormal; Notable for the following components:   Glucose, UA >=500 (*)    All other components within normal limits  URINALYSIS, MICROSCOPIC (REFLEX) - Abnormal; Notable for the following components:   Bacteria, UA RARE (*)    All other components within normal limits  CBC WITH DIFFERENTIAL/PLATELET   EKG None  Radiology CT ABDOMEN PELVIS W CONTRAST  Result Date: 03/10/2021 CLINICAL DATA:  Acute right flank pain. EXAM: CT ABDOMEN AND PELVIS WITH CONTRAST TECHNIQUE: Multidetector CT imaging of the abdomen and pelvis was performed using the standard protocol following bolus administration of intravenous contrast. CONTRAST:  OMNIPAQUE IOHEXOL 300 MG/ML  SOLN COMPARISON:  March 08, 2021. FINDINGS: Lower chest: No acute abnormality. Hepatobiliary: Hepatic steatosis is noted. No cholelithiasis or biliary dilatation is noted. Pancreas: Unremarkable. No pancreatic ductal dilatation or surrounding inflammatory changes. Spleen: Normal in size without focal abnormality. Adrenals/Urinary Tract: Adrenal glands are unremarkable. Kidneys are normal, without renal calculi, focal lesion, or hydronephrosis. Bladder is unremarkable. Stomach/Bowel: Stomach is within normal limits. Appendix appears normal. No evidence of bowel wall thickening, distention, or inflammatory changes. Vascular/Lymphatic: Aortic atherosclerosis. No enlarged abdominal or pelvic lymph nodes. Reproductive: Prostate is unremarkable. Other: Small fat containing left inguinal hernia is noted. No ascites is noted. Musculoskeletal: No acute or significant osseous findings. IMPRESSION: Hepatic steatosis. Small fat containing left inguinal hernia. No acute  abnormality seen in the abdomen or pelvis. Aortic Atherosclerosis (ICD10-I70.0). Electronically Signed   By: Lupita Raider M.D.   On: 03/10/2021 10:47   CT ABDOMEN PELVIS W CONTRAST  Result Date: 03/08/2021 CLINICAL DATA:  Left-sided abdominal and flank pain. EXAM: CT ABDOMEN AND PELVIS WITH CONTRAST TECHNIQUE: Multidetector CT imaging of the abdomen and pelvis was performed using the standard protocol following bolus administration of intravenous contrast. CONTRAST:  OMNIPAQUE IOHEXOL 300 MG/ML  SOLN COMPARISON:  None. FINDINGS: Lower chest: The lung bases are clear of an acute process. No pulmonary lesions or pleural effusions. The heart is normal in size. Age advanced three-vessel coronary artery calcifications are noted. Hepatobiliary: Diffuse fatty infiltration of the liver but no focal hepatic lesions or intrahepatic biliary dilatation. The gallbladder is unremarkable. No common bile duct dilatation. Pancreas: No mass, inflammation or ductal dilatation. Spleen: Normal size.  No focal lesions. Adrenals/Urinary Tract: Adrenal glands and kidneys are unremarkable. No renal lesions or hydroureteronephrosis. No ureteral or bladder calculi. The delayed images do not demonstrate any significant collecting system abnormalities. 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 Stomach/Bowel: The stomach, duodenum, small bowel and colon are unremarkable. No acute inflammatory changes, mass lesions or obstructive findings. The terminal ileum is normal. The appendix is normal. Vascular/Lymphatic: The aorta is normal in caliber. No dissection. Moderate age advanced atherosclerotic calcifications. The branch vessels are patent. The major venous structures are patent. No mesenteric or retroperitoneal mass or adenopathy. Small scattered lymph nodes are noted. Reproductive: The prostate gland and seminal vesicles are unremarkable. Other: No pelvic mass or adenopathy. No free pelvic fluid collections. No  inguinal mass or adenopathy. No abdominal wall hernia or subcutaneous lesions. Musculoskeletal: No significant bony findings. IMPRESSION: 1. No acute abdominal/pelvic findings, mass lesions or adenopathy. 2. Diffuse fatty infiltration of the liver. 3. Age advanced atherosclerotic calcifications involving the aorta and coronary arteries. 4. 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 Electronically Signed   By: Rudie Meyer M.D.   On: 03/08/2021 12:22    Procedures Procedures   Medications Ordered in ED Medications  ketorolac (TORADOL) 15 MG/ML injection 15 mg (15 mg Intravenous Given 03/10/21 0949)  iohexol (OMNIPAQUE) 300 MG/ML solution 100 mL (100 mLs Intravenous Contrast Given 03/10/21 1004)  ED Course  I have reviewed the triage vital signs and the nursing notes.  Pertinent labs & imaging results that were available during my care of the patient were reviewed by me and considered in my medical decision making (see chart for details).    MDM Rules/Calculators/A&P                          Patient presenting with persistent R lower back pain after recent fall, overall negative work-up two days ago. He is exquisitely tender, especially with movement, but no abdominal pain or CVA tenderness. No red flag symptoms. He is hemodynamically stable, afebrile. Will repeat basic lab work, UA, and repeat CT abdomen/pelvis. Will treat pain with toradol injection.   CT abdomen/pelvis, lab work benign. Most likely symptoms caused by muscle strain from fall last week. Will prescribe robaxin with instructions to visit PCP if pain not resolved.  Final Clinical Impression(s) / ED Diagnoses Final diagnoses:  Muscle strain  Strain of lumbar region, subsequent encounter    Rx / DC Orders ED Discharge Orders         Ordered    methocarbamol (ROBAXIN) 500 MG tablet  Every 8 hours PRN        03/10/21 1119           Evlyn KannerBraswell, Kiondra Caicedo, MD 03/10/21 1128    Milagros Lollykstra, Richard S,  MD 03/11/21 1317

## 2021-03-10 NOTE — Discharge Instructions (Addendum)
Jacob Downs, It was a pleasure meeting you today. Our lab work and imaging did not reveal any signs of infection. Likely this pain is related to your recent fall. We will prescribe another muscle relaxer for the pain. If the pain does not improve, please go see your primary care doctor next week.   Thank you, [name], MD

## 2021-03-10 NOTE — ED Triage Notes (Signed)
Pt from home c/o RIGHT lower back pain radiating to the right side of the abdomen. Pt was seen at [facility] 4/18 for same and they looked for stones. Pt reports a fall last week and landed on right knee. Did not have back pain at that time, mainly knee pain but back pain started on Sunday.

## 2021-03-22 ENCOUNTER — Other Ambulatory Visit: Payer: Self-pay | Admitting: Adult Health

## 2021-04-08 ENCOUNTER — Telehealth: Payer: Self-pay | Admitting: Cardiology

## 2021-04-08 NOTE — Telephone Encounter (Signed)
Returned call to patient's wife no answer.Unable to leave a message voice mail full.

## 2021-04-08 NOTE — Telephone Encounter (Signed)
This RN received call, patient's wife Hajime Asfaw, ok to speak to her per [facility]. She called on behalf of her husband to report chest pain he has been having on and off since 5/13. [name] reports it wakes him at night and that it does not improve unless he takes nitro, which he has been taking frequently. [name] says pt has not experienced shortness of breath or dizziness, but has had a cough.  [name] reports he does not have any recent vitals. This RN advised that patient be taken to the emergency room or call 911 right away with active chest pain. [name] verbalized understanding. [name] reports pt is at work, this Char[employer] fundraiser advised pt's wife to call patient and have someone take him to the emergency department or call 911. Patient's wife verbalized understanding.  This RN was also able to schedule the patient an appointment 5/24 at 11:15am with Marjie Skiff. This RN to notify pt's primary physician Dr. Jens Som as well.

## 2021-04-08 NOTE — Telephone Encounter (Signed)
Pt c/o of Chest Pain: STAT if CP now or developed within 24 hours  1. Are you having CP right now? Per wife, she does not think so right now. He is at work right now but states he experiences it when he gets home and especially before he goes to bed.   2. Are you experiencing any other symptoms (ex. SOB, nausea, vomiting, sweating)? Has a bad cough   3. How long have you been experiencing CP? Since last Friday 05/13 but has been happening everyday  4. Is your CP continuous or coming and going? Coming and going  5. Have you taken Nitroglycerin? Yes, this week he has been taking nitro every night. Last night he took 2.  ?

## 2021-04-10 NOTE — Progress Notes (Signed)
Cardiology Office Note:    Date:  04/13/2021   ID:  Jacob Downs, DOB 09/22/1961, MRN [id number]  PCP:  Little, Kevin, MD  Cardiologist:  Brian Crenshaw, MD  Electrophysiologist:  None   Referring MD: Little, Kevin, MD   Chief Complaint: chest pain  History of Present Illness:    Jacob Downs is a 60 y.o. 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 Crenshaw and presents today for further evaluation of chest pain.   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 The LAD was noted to be moderate in caliber proximally and heavily calcified. The vessel quickly becomes small in caliber beyond the takeoff of the 1st Diag branch. There was a 99% stenosis of the mid LAD. The vessel is very small and diffusely disease beyond the severe stenosis. It was too small for PCI; therefore, she was treated medically. Also noted to have mild non-obstructive RCA and LCX disease. She was started on Imdur. Echo at this time showed LVEF of 50-55% with grade 1 diastolic dysfunction. No significant valvular disease.   Patient was last seen in 05/2020 at which time he was doing well from a cardiac standpoint. Patient's wife called our office on 04/08/2021 and reported patient has been having on and off chest pain since 04/02/2021 that wakes him up at night and improves with Nitro which he has been taking frequently. Therefore, this visit was arranged for further evaluation.  Patient here alone. He has had progressive chest pain over the last month. He describes this as a pressure/tightness that sometimes radiates to his left arm. This has been occurring with exertion. 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 He has been having chest pain with this as well as  walking up the steps. Pain improves when he rests or takes sublingual Nitro. He does not usually have pain with these activities. He also has chest pain at night but this seems to be from coughing. However, this pain also improves with Nitro. He states he had an episode of severe chest pain the other night and thought he was going to have to go to the ED but pain improved after 2 doses of subglingual Nitro (he normally never has to take 2 doses). Pain is associated with mild shortness of breath but no diaphoresis, nausea, vomiting. No dyspnea outside of episodes of chest pain. No orthopnea, PND, or edema. No palpitations, lightheadedness, dizziness, or syncope.  Of note, he has had nasal congestion and a productive cough for the past couple of weeks. He states he was tested for COVID and influenza last week and this was negative. Advised him to follow-up with PCP if cough persists.    Past Medical History:  Diagnosis Date  . Arthritis   . Cellulitis of left hand 11/27/2015  . Diabetes mellitus without complication (HCC)   . GERD (gastroesophageal reflux disease)   . Hyperlipemia   . Shingles    4-5 years ago  . 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 CARDIAC CATHETERIZATION N/A 11/30/2015   Procedure: Left Heart Cath and Coronary Angiography;  Surgeon: Christopher D McAlhany, MD;  Location: [facility];  Service: Cardiovascular;  Laterality: N/A;  .   COLONOSCOPY    . I & D EXTREMITY Right 11/26/2014   Procedure: INCISION AND DRAINAGE OF RIGHT INDEX FINGER;  Surgeon: Dairl Ponder, MD;  Location: [facility] OR;  Service: Orthopedics;  Laterality: Right;  . ORIF ANKLE FRACTURE  10/03/2012   Procedure: OPEN REDUCTION INTERNAL FIXATION (ORIF) ANKLE FRACTURE;  Surgeon: Nestor Lewandowsky, MD;  Location: [facility];  Service: Orthopedics;  Laterality: Left;  . PLANTAR FASCIA SURGERY  06,07   both feet  . TONSILLECTOMY     as a child     Current Medications: Current Meds  Medication Sig  . aspirin 81 MG tablet Take 81 mg by mouth daily.  Marland Kitchen atorvastatin (LIPITOR) 80 MG tablet TAKE 1 TABLET(80 MG) BY MOUTH EVERY EVENING  . famotidine (PEPCID) 20 MG tablet Take 20 mg by mouth as needed for heartburn.   Marland Kitchen JARDIANCE 25 MG TABS tablet   . metFORMIN (GLUCOPHAGE) 500 MG tablet Take 500-1,000 mg by mouth 2 (two) times daily with a meal. Take 2 tablets in the morning Take 1 tablet in the evening  . nitroGLYCERIN (NITROSTAT) 0.4 MG SL tablet DISSOLVE 1 TABLET UNDER THE TONGUE EVERY 5 MINUTES FOR 3 DOSES AS NEEDED FOR CHEST PAIN  . [DISCONTINUED] isosorbide mononitrate (IMDUR) 60 MG 24 hr tablet Take 1 tablet (60 mg total) by mouth daily.  . [DISCONTINUED] metoprolol tartrate (LOPRESSOR) 25 MG tablet TAKE 1/2 TABLET BY MOUTH TWICE DAILY     Allergies:   Patient has no known allergies.   Social History   Socioeconomic History  . Marital status: Married    Spouse name: Not on file  . Number of children: 2  . Years of education: Not on file  . Highest education level: Not on file  Occupational History  . 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 Smoking status: Never Smoker  . Smokeless tobacco: Never Used  Substance and Sexual Activity  . Alcohol use: Yes    Alcohol/week: 0.0 standard drinks    Comment: 6 to 12 beers per week  . Drug use: No  . Sexual activity: Not on file  Other Topics Concern  . Not on file  Social History Narrative  . Not on file   Social Determinants of Health   Financial Resource Strain: Not on file  Food Insecurity: Not on file  Transportation Needs: Not on file  Physical Activity: Not on file  Stress: Not on file  Social Connections: Not on file     Family History: The patient's family history includes CAD in an other family member. There is no history of Colon cancer.  ROS:   Please see the history of present illness.     EKGs/Labs/Other Studies Reviewed:    The following  studies were reviewed today:  Left Cardiac Catheterization 11/30/2015: 1. Severe single vessel CAD 2. The LAD is moderate in caliber proximally and is heavily calcified. The vessel quickly becomes small in caliber beyond the takeoff of the first diagonal branch. There is a 99% stenosis in the mid LAD just beyond the diagonal branch. The vessel is very small and diffusely diseased beyond the severe stenosis. The LAD is too small for PCI/stenting. The Diagonal branch is small in caliber with moderate proximal stenosis.  3. The RCA and Circumflex has mild non-obstructive disease.  4. Mild segmental LV systolic dysfunction.   Recommendations: His LAD is the likely culprit but it is a small, diffusely diseased vessel and not a favorable target for PCI/stenting. Will  recommend medical management. Will start Imdur. Continue ASA, beta blocker and statin. Will arrange echo today.  _______________  Echocardiogram 11/30/2015: Study Conclusions: - Left ventricle: The cavity size was normal. Wall thickness was  normal. Systolic function was normal. The estimated ejection  fraction was in the range of 50% to 55%. Doppler parameters are  consistent with abnormal left ventricular relaxation (grade 1  diastolic dysfunction).  - Right ventricle: Endocardium of RV free wall is difficlult to  see. No subcostal views. Overall RVEF is normal to mildly  reduced.   EKG:  EKG ordered today. EKG personally reviewed and demonstrates normal sinus rhythm, rate 77 bpm, with no acute ST/T changes. Normal axis. PR and QRS intervals. QTc 448 ms.   Recent Labs: 03/10/2021: ALT 93; BUN 15; Creatinine, Ser 0.73; Hemoglobin 17.0; Platelets 173; Potassium 4.7; Sodium 139  Recent Lipid Panel    Component Value Date/Time   CHOL 126 11/30/2015 0237   TRIG 84 11/30/2015 0237   HDL 30 (L) 11/30/2015 0237   CHOLHDL 4.2 11/30/2015 0237   VLDL 17 11/30/2015 0237   LDLCALC 79 11/30/2015 0237    Physical Exam:     Vital Signs: BP 136/88   Pulse 77   Ht 6' (1.829 m)   Wt 253 lb 12.8 oz (115.1 kg)   SpO2 94%   BMI 34.42 kg/m     Wt Readings from Last 3 Encounters:  04/13/21 253 lb 12.8 oz (115.1 kg)  03/10/21 255 lb (115.7 kg)  06/10/20 255 lb (115.7 kg)     General: 60 y.o. male in no acute distress. HEENT: Normocephalic and atraumatic. Sclera clear.  Neck: Supple. No carotid bruits. No JVD. Heart: RRR. Distinct S1 and S2. No murmurs, gallops, or rubs. Radial pulses 2+ and equal bilaterally. Lungs: No increased work of breathing. Clear to ausculation bilaterally. No wheezes, rhonchi, or rales.  Extremities: No lower extremity edema.    Skin: Warm and dry. Neuro: Alert and oriented x3. No focal deficits. Psych: Normal affect. Responds appropriately.  Assessment:    1. Chest pain of uncertain etiology   2. Coronary artery disease involving native coronary artery of native heart with unstable angina pectoris (HCC)   3. Primary hypertension   4. Hyperlipidemia, unspecified hyperlipidemia type   5. Type 2 diabetes mellitus with complication, without long-term current use of insulin (HCC)   6. Medication management   7. 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 He has now been having more chest pain with exertion as well as at night. Improves with sublingual Nitro. Concerning for accelerating angina/unstable angina. - EKG shows no acute ischemic changes. - Will increase Imdur to 90mg  daily and Lopressor to 25mg  twice daily. - Continue aspirin  and high-intensity statin.  - Recommend left sided cardiac catheterization. Discussed with Dr. who agrees. The patient understands that risks include but are not limited to stroke (1 in 1000), death (1 in 1000), kidney failure [usually temporary] (1 in 500), bleeding (1 in 200), allergic  reaction [possibly serious] (1 in 200), and agrees to proceed.  - Advised patient that if symptoms worsen prior to cath, he should go to the ED. Patient voiced understanding. - Will check pre-procedural labs (CBC and BMET) today.  Cath scheduled for 04/15/2021 at 10:30am with Dr. Royann Shivers.   Hypertension -  BP relatively well controlled. - Will increase Imdur to 90mg  daily and Lopressor to 25mg  twice daily.  Hyperlipidemia - Last lipid panel in 06/2020: Total Cholesterol 144, Triglycerides 135, HDL 45, LDL 75.  - Slightly above LDL goal of <70 given CAD. - Continue Lipitor 80mg  daily.  Type 2 Diabetes Mellitus - On Metformin and Jardiance. - Managed by PCP.  Disposition: Follow up after cardiac catheterization.   Medication Adjustments/Labs and Tests Ordered: Current medicines are reviewed at length with the patient today.  Concerns regarding medicines are outlined above.  Orders Placed This Encounter  Procedures  . CBC  . Basic metabolic panel  . EKG 12-Lead   Meds ordered this encounter  Medications  . isosorbide mononitrate (IMDUR) 60 MG 24 hr tablet    Sig: Take 1.5 tablets (90 mg total) by mouth daily.    Dispense:  45 tablet    Refill:  3  . metoprolol tartrate (LOPRESSOR) 25 MG tablet    Sig: Take 1 tablet (25 mg total) by mouth 2 (two) times daily.    Dispense:  60 tablet    Refill:  3    ZERO refills remain on this prescription. Your patient is requesting advance approval of refills for this medication to PREVENT ANY MISSED DOSES    Patient Instructions  Medication Instructions:  INCREASE IMDUR 90MG  DAILY  INCREASE METOPROLOL 25MG  TWICE DAILY *If you need a refill on your cardiac medications before your next appointment, please call your pharmacy*  At [facility], you and your health needs are our priority.  As part of our continuing mission to provide you with exceptional heart care, we have created designated Provider Care Teams.  These Care Teams include  your primary Cardiologist (physician) and Advanced Practice Providers (APPs -  Physician Assistants and Nurse Practitioners) who all work together to provide you with the care you need, when you need it.  We recommend signing up for the patient portal called "MyChart".  Sign up information is provided on this After Visit Summary.  MyChart is used to connect with patients for Virtual Visits (Telemedicine).  Patients are able to view lab/test results, encounter notes, upcoming appointments, etc.  Non-urgent messages can be sent to your provider as well.   To learn more about what you can do with MyChart, go to [email or web address].        [facility] [address] [place] [id number] Dept: [phone] Loc: [phone]  Sheldon SilvanSteven R Gruenewald  04/13/2021  You are scheduled for a Cardiac Catheterization on Thursday, May 26 with Dr. Lance MussJayadeep Varanasi.  1. [facility]: [address] [place], [place] [address] at 8:30 AM (This time is two hours before your procedure to ensure your preparation). Free valet parking service is available.   Special note: Every effort is made to have your procedure done on time. Please understand that emergencies sometimes delay scheduled procedures.  2. Diet: Do not eat solid foods after midnight.  The patient may have clear liquids until 5am upon the day of the procedure.  3. Labs: You will need to have blood drawn on Wednesday, May [facility] at [facility] [address], [place]  Open: 8am - 5pm (Lunch 12:30 - 1:30)   Phone: [phone]. You do not need to be fasting.  4. Medication instructions in preparation for your procedure:   Contrast Allergy: No  DO NOT  TAKE METFORMIN DAY OF THE CATHThursday, May 26,2022 DO NOT TAKE JARDIANCE THE DAY OF THE CATH,  Thursday MAY 26th  On the morning of your  procedure, take your Aspirin and any morning medicines NOT listed above.  You may use sips of water.  5. Plan for one night stay--bring personal belongings. 6. Bring a current list of your medications and current insurance cards. 7. You MUST have a responsible person to drive you home. 8. Someone MUST be with you the first 24 hours after you arrive home or your discharge will be delayed. 9. Please wear clothes that are easy to get on and off and wear slip-on shoes.  Thank you for allowing [place] to care for you!   -- [facility] services      Signed, Smitty Knudsen  04/13/2021 1:19 PM    [facility]

## 2021-04-10 NOTE — H&P (View-Only) (Signed)
Cardiology Office Note:    Date:  04/13/2021   ID:  Jacob Downs, DOB 10/02/1961, MRN [id number]  PCP:  Catha Gosselin, MD  Cardiologist:  Olga Millers, MD  Electrophysiologist:  None   Referring MD: Catha Gosselin, MD   Chief Complaint: chest pain  History of Present Illness:    Jacob Downs is a 60 y.o. 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 Jens Som and presents today for further evaluation of chest pain.   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 The LAD was noted to be moderate in caliber proximally and heavily calcified. The vessel quickly becomes small in caliber beyond the takeoff of the 1st Diag branch. There was a 99% stenosis of the mid LAD. The vessel is very small and diffusely disease beyond the severe stenosis. It was too small for PCI; therefore, she was treated medically. Also noted to have mild non-obstructive RCA and LCX disease. She was started on Imdur. Echo at this time showed LVEF of 50-55% with grade 1 diastolic dysfunction. No significant valvular disease.   Patient was last seen in 05/2020 at which time he was doing well from a cardiac standpoint. Patient's wife called our office on 04/08/2021 and reported patient has been having on and off chest pain since 04/02/2021 that wakes him up at night and improves with Nitro which he has been taking frequently. Therefore, this visit was arranged for further evaluation.  Patient here alone. He has had progressive chest pain over the last month. He describes this as a pressure/tightness that sometimes radiates to his left arm. This has been occurring with exertion. 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 He has been having chest pain with this as well as  walking up the steps. Pain improves when he rests or takes sublingual Nitro. He does not usually have pain with these activities. He also has chest pain at night but this seems to be from coughing. However, this pain also improves with Nitro. He states he had an episode of severe chest pain the other night and thought he was going to have to go to the ED but pain improved after 2 doses of subglingual Nitro (he normally never has to take 2 doses). Pain is associated with mild shortness of breath but no diaphoresis, nausea, vomiting. No dyspnea outside of episodes of chest pain. No orthopnea, PND, or edema. No palpitations, lightheadedness, dizziness, or syncope.  Of note, he has had nasal congestion and a productive cough for the past couple of weeks. He states he was tested for COVID and influenza last week and this was negative. Advised him to follow-up with PCP if cough persists.    Past Medical History:  Diagnosis Date  . Arthritis   . Cellulitis of left hand 11/27/2015  . Diabetes mellitus without complication (HCC)   . GERD (gastroesophageal reflux disease)   . Hyperlipemia   . Shingles    4-5 years ago  . 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 CARDIAC CATHETERIZATION N/A 11/30/2015   Procedure: Left Heart Cath and Coronary Angiography;  Surgeon: Kathleene Hazel, MD;  Location: [facility] INVASIVE CV LAB;  Service: Cardiovascular;  Laterality: N/A;  .  COLONOSCOPY    . I & D EXTREMITY Right 11/26/2014   Procedure: INCISION AND DRAINAGE OF RIGHT INDEX FINGER;  Surgeon: Dairl Ponder, MD;  Location: [facility] OR;  Service: Orthopedics;  Laterality: Right;  . ORIF ANKLE FRACTURE  10/03/2012   Procedure: OPEN REDUCTION INTERNAL FIXATION (ORIF) ANKLE FRACTURE;  Surgeon: Nestor Lewandowsky, MD;  Location: [facility];  Service: Orthopedics;  Laterality: Left;  . PLANTAR FASCIA SURGERY  06,07   both feet  . TONSILLECTOMY     as a child     Current Medications: Current Meds  Medication Sig  . aspirin 81 MG tablet Take 81 mg by mouth daily.  Marland Kitchen atorvastatin (LIPITOR) 80 MG tablet TAKE 1 TABLET(80 MG) BY MOUTH EVERY EVENING  . famotidine (PEPCID) 20 MG tablet Take 20 mg by mouth as needed for heartburn.   Marland Kitchen JARDIANCE 25 MG TABS tablet   . metFORMIN (GLUCOPHAGE) 500 MG tablet Take 500-1,000 mg by mouth 2 (two) times daily with a meal. Take 2 tablets in the morning Take 1 tablet in the evening  . nitroGLYCERIN (NITROSTAT) 0.4 MG SL tablet DISSOLVE 1 TABLET UNDER THE TONGUE EVERY 5 MINUTES FOR 3 DOSES AS NEEDED FOR CHEST PAIN  . [DISCONTINUED] isosorbide mononitrate (IMDUR) 60 MG 24 hr tablet Take 1 tablet (60 mg total) by mouth daily.  . [DISCONTINUED] metoprolol tartrate (LOPRESSOR) 25 MG tablet TAKE 1/2 TABLET BY MOUTH TWICE DAILY     Allergies:   Patient has no known allergies.   Social History   Socioeconomic History  . Marital status: Married    Spouse name: Not on file  . Number of children: 2  . Years of education: Not on file  . Highest education level: Not on file  Occupational History  . 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 Smoking status: Never Smoker  . Smokeless tobacco: Never Used  Substance and Sexual Activity  . Alcohol use: Yes    Alcohol/week: 0.0 standard drinks    Comment: 6 to 12 beers per week  . Drug use: No  . Sexual activity: Not on file  Other Topics Concern  . Not on file  Social History Narrative  . Not on file   Social Determinants of Health   Financial Resource Strain: Not on file  Food Insecurity: Not on file  Transportation Needs: Not on file  Physical Activity: Not on file  Stress: Not on file  Social Connections: Not on file     Family History: The patient's family history includes CAD in an other family member. There is no history of Colon cancer.  ROS:   Please see the history of present illness.     EKGs/Labs/Other Studies Reviewed:    The following  studies were reviewed today:  Left Cardiac Catheterization 11/30/2015: 1. Severe single vessel CAD 2. The LAD is moderate in caliber proximally and is heavily calcified. The vessel quickly becomes small in caliber beyond the takeoff of the first diagonal branch. There is a 99% stenosis in the mid LAD just beyond the diagonal branch. The vessel is very small and diffusely diseased beyond the severe stenosis. The LAD is too small for PCI/stenting. The Diagonal branch is small in caliber with moderate proximal stenosis.  3. The RCA and Circumflex has mild non-obstructive disease.  4. Mild segmental LV systolic dysfunction.   Recommendations: His LAD is the likely culprit but it is a small, diffusely diseased vessel and not a favorable target for PCI/stenting. Will  recommend medical management. Will start Imdur. Continue ASA, beta blocker and statin. Will arrange echo today.  _______________  Echocardiogram 11/30/2015: Study Conclusions: - Left ventricle: The cavity size was normal. Wall thickness was  normal. Systolic function was normal. The estimated ejection  fraction was in the range of 50% to 55%. Doppler parameters are  consistent with abnormal left ventricular relaxation (grade 1  diastolic dysfunction).  - Right ventricle: Endocardium of RV free wall is difficlult to  see. No subcostal views. Overall RVEF is normal to mildly  reduced.   EKG:  EKG ordered today. EKG personally reviewed and demonstrates normal sinus rhythm, rate 77 bpm, with no acute ST/T changes. Normal axis. PR and QRS intervals. QTc 448 ms.   Recent Labs: 03/10/2021: ALT 93; BUN 15; Creatinine, Ser 0.73; Hemoglobin 17.0; Platelets 173; Potassium 4.7; Sodium 139  Recent Lipid Panel    Component Value Date/Time   CHOL 126 11/30/2015 0237   TRIG 84 11/30/2015 0237   HDL 30 (L) 11/30/2015 0237   CHOLHDL 4.2 11/30/2015 0237   VLDL 17 11/30/2015 0237   LDLCALC 79 11/30/2015 0237    Physical Exam:     Vital Signs: BP 136/88   Pulse 77   Ht 6' (1.829 m)   Wt 253 lb 12.8 oz (115.1 kg)   SpO2 94%   BMI 34.42 kg/m     Wt Readings from Last 3 Encounters:  04/13/21 253 lb 12.8 oz (115.1 kg)  03/10/21 255 lb (115.7 kg)  06/10/20 255 lb (115.7 kg)     General: 60 y.o. male in no acute distress. HEENT: Normocephalic and atraumatic. Sclera clear.  Neck: Supple. No carotid bruits. No JVD. Heart: RRR. Distinct S1 and S2. No murmurs, gallops, or rubs. Radial pulses 2+ and equal bilaterally. Lungs: No increased work of breathing. Clear to ausculation bilaterally. No wheezes, rhonchi, or rales.  Extremities: No lower extremity edema.    Skin: Warm and dry. Neuro: Alert and oriented x3. No focal deficits. Psych: Normal affect. Responds appropriately.  Assessment:    1. Chest pain of uncertain etiology   2. Coronary artery disease involving native coronary artery of native heart with unstable angina pectoris (HCC)   3. Primary hypertension   4. Hyperlipidemia, unspecified hyperlipidemia type   5. Type 2 diabetes mellitus with complication, without long-term current use of insulin (HCC)   6. Medication management   7. 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 He has now been having more chest pain with exertion as well as at night. Improves with sublingual Nitro. Concerning for accelerating angina/unstable angina. - EKG shows no acute ischemic changes. - Will increase Imdur to 90mg  daily and Lopressor to 25mg  twice daily. - Continue aspirin  and high-intensity statin.  - Recommend left sided cardiac catheterization. Discussed with Dr. who agrees. The patient understands that risks include but are not limited to stroke (1 in 1000), death (1 in 1000), kidney failure [usually temporary] (1 in 500), bleeding (1 in 200), allergic  reaction [possibly serious] (1 in 200), and agrees to proceed.  - Advised patient that if symptoms worsen prior to cath, he should go to the ED. Patient voiced understanding. - Will check pre-procedural labs (CBC and BMET) today.  Cath scheduled for 04/15/2021 at 10:30am with Dr. Royann Shivers.   Hypertension -  BP relatively well controlled. - Will increase Imdur to 90mg  daily and Lopressor to 25mg  twice daily.  Hyperlipidemia - Last lipid panel in 06/2020: Total Cholesterol 144, Triglycerides 135, HDL 45, LDL 75.  - Slightly above LDL goal of <70 given CAD. - Continue Lipitor 80mg  daily.  Type 2 Diabetes Mellitus - On Metformin and Jardiance. - Managed by PCP.  Disposition: Follow up after cardiac catheterization.   Medication Adjustments/Labs and Tests Ordered: Current medicines are reviewed at length with the patient today.  Concerns regarding medicines are outlined above.  Orders Placed This Encounter  Procedures  . CBC  . Basic metabolic panel  . EKG 12-Lead   Meds ordered this encounter  Medications  . isosorbide mononitrate (IMDUR) 60 MG 24 hr tablet    Sig: Take 1.5 tablets (90 mg total) by mouth daily.    Dispense:  45 tablet    Refill:  3  . metoprolol tartrate (LOPRESSOR) 25 MG tablet    Sig: Take 1 tablet (25 mg total) by mouth 2 (two) times daily.    Dispense:  60 tablet    Refill:  3    ZERO refills remain on this prescription. Your patient is requesting advance approval of refills for this medication to PREVENT ANY MISSED DOSES    Patient Instructions  Medication Instructions:  INCREASE IMDUR 90MG  DAILY  INCREASE METOPROLOL 25MG  TWICE DAILY *If you need a refill on your cardiac medications before your next appointment, please call your pharmacy*  At [facility], you and your health needs are our priority.  As part of our continuing mission to provide you with exceptional heart care, we have created designated Provider Care Teams.  These Care Teams include  your primary Cardiologist (physician) and Advanced Practice Providers (APPs -  Physician Assistants and Nurse Practitioners) who all work together to provide you with the care you need, when you need it.  We recommend signing up for the patient portal called "MyChart".  Sign up information is provided on this After Visit Summary.  MyChart is used to connect with patients for Virtual Visits (Telemedicine).  Patients are able to view lab/test results, encounter notes, upcoming appointments, etc.  Non-urgent messages can be sent to your provider as well.   To learn more about what you can do with MyChart, go to [email or web address].        [facility] [address] [place] [id number] Dept: [phone] Loc: [phone]  Jacob Downs  04/13/2021  You are scheduled for a Cardiac Catheterization on Thursday, May 26 with Dr. Lance MussJayadeep Varanasi.  1. [facility] at [facility]: [address] [place], [place] [address] at 8:30 AM (This time is two hours before your procedure to ensure your preparation). Free valet parking service is available.   Special note: Every effort is made to have your procedure done on time. Please understand that emergencies sometimes delay scheduled procedures.  2. Diet: Do not eat solid foods after midnight.  The patient may have clear liquids until 5am upon the day of the procedure.  3. Labs: You will need to have blood drawn on Wednesday, May 25 at [facility] [address], [place]  Open: 8am - 5pm (Lunch 12:30 - 1:30)   Phone: [phone]. You do not need to be fasting.  4. Medication instructions in preparation for your procedure:   Contrast Allergy: No  DO NOT  TAKE METFORMIN DAY OF THE CATHThursday, May 26,2022 DO NOT TAKE JARDIANCE THE DAY OF THE CATH,  Thursday MAY 26th  On the morning of your  procedure, take your Aspirin and any morning medicines NOT listed above.  You may use sips of water.  5. Plan for one night stay--bring personal belongings. 6. Bring a current list of your medications and current insurance cards. 7. You MUST have a responsible person to drive you home. 8. Someone MUST be with you the first 24 hours after you arrive home or your discharge will be delayed. 9. Please wear clothes that are easy to get on and off and wear slip-on shoes.  Thank you for allowing [place] to care for you!   -- [facility] In[facility] Cardiovascular services      Signed, Smitty Knudsen  04/13/2021 1:19 PM    [facility]

## 2021-04-13 ENCOUNTER — Other Ambulatory Visit: Payer: Self-pay

## 2021-04-13 ENCOUNTER — Encounter: Payer: Self-pay | Admitting: Student

## 2021-04-13 ENCOUNTER — Ambulatory Visit (INDEPENDENT_AMBULATORY_CARE_PROVIDER_SITE_OTHER): Payer: BC Managed Care – PPO | Admitting: Student

## 2021-04-13 VITALS — BP 136/88 | HR 77 | Ht 72.0 in | Wt 253.8 lb

## 2021-04-13 DIAGNOSIS — I25110 Atherosclerotic heart disease of native coronary artery with unstable angina pectoris: Secondary | ICD-10-CM

## 2021-04-13 DIAGNOSIS — E785 Hyperlipidemia, unspecified: Secondary | ICD-10-CM

## 2021-04-13 DIAGNOSIS — R079 Chest pain, unspecified: Secondary | ICD-10-CM | POA: Diagnosis not present

## 2021-04-13 DIAGNOSIS — E118 Type 2 diabetes mellitus with unspecified complications: Secondary | ICD-10-CM

## 2021-04-13 DIAGNOSIS — I10 Essential (primary) hypertension: Secondary | ICD-10-CM | POA: Diagnosis not present

## 2021-04-13 DIAGNOSIS — I200 Unstable angina: Secondary | ICD-10-CM

## 2021-04-13 DIAGNOSIS — Z79899 Other long term (current) drug therapy: Secondary | ICD-10-CM

## 2021-04-13 MED ORDER — NITROGLYCERIN 0.4 MG SL SUBL: SUBLINGUAL_TABLET | SUBLINGUAL | 2 refills | Status: DC

## 2021-04-13 MED ORDER — METOPROLOL TARTRATE 25 MG PO TABS: 25.0000 mg | ORAL_TABLET | Freq: Two times a day (BID) | ORAL | 3 refills | Status: DC

## 2021-04-13 MED ORDER — ISOSORBIDE MONONITRATE ER 60 MG PO TB24: 90.0000 mg | ORAL_TABLET | Freq: Every day | ORAL | 3 refills | Status: DC

## 2021-04-13 NOTE — Patient Instructions (Addendum)
Medication Instructions:  INCREASE IMDUR 90MG  DAILY  INCREASE METOPROLOL 25MG  TWICE DAILY *If you need a refill on your cardiac medications before your next appointment, please call your pharmacy*  At [facility], you and your health needs are our priority.  As part of our continuing mission to provide you with exceptional heart care, we have created designated Provider Care Teams.  These Care Teams include your primary Cardiologist (physician) and Advanced Practice Providers (APPs -  Physician Assistants and Nurse Practitioners) who all work together to provide you with the care you need, when you need it.  We recommend signing up for the patient portal called "MyChart".  Sign up information is provided on this After Visit Summary.  MyChart is used to connect with patients for Virtual Visits (Telemedicine).  Patients are able to view lab/test results, encounter notes, upcoming appointments, etc.  Non-urgent messages can be sent to your provider as well.   To learn more about what you can do with MyChart, go to .        [facility] CARDIOVASCULAR DIVISION [facility] [address] [place] Dept: [phone] Loc: [phone]  BERTRAN ZEIMET  04/13/2021  You are scheduled for a Cardiac Catheterization on Thursday, May 26 with Dr. 04/15/2021.  1. Please arrive at the [facility] (Main Entrance A) at [facility]: [address] [place], [address] [place] at 8:30 AM (This time is two hours before your procedure to ensure your preparation). Free valet parking service is available.   Special note: Every effort is made to have your procedure done on time. Please understand that emergencies sometimes delay scheduled procedures.  2. Diet: Do not eat solid foods after midnight.  The patient may have clear liquids until 5am upon the day of the procedure.  3. Labs: You will need to have blood  drawn on Wednesday, May 25 at [facility] [address], [facility]  Open: 8am - 5pm (Lunch 12:30 - 1:30)   Phone: [phone]. You do not need to be fasting.  4. Medication instructions in preparation for your procedure:   Contrast Allergy: No  DO NOT TAKE METFORMIN DAY OF THE CATHThursday, May 26,2022 DO NOT TAKE JARDIANCE THE DAY OF THE CATH,  Thursday MAY 26th  On the morning of your procedure, take your Aspirin and any morning medicines NOT listed above.  You may use sips of water.  5. Plan for one night stay--bring personal belongings. 6. Bring a current list of your medications and current insurance cards. 7. You MUST have a responsible person to drive you home. 8. Someone MUST be with you the first 24 hours after you arrive home or your discharge will be delayed. 9. Please wear clothes that are easy to get on and off and wear slip-on shoes.  Thank you for allowing [place] to care for you!   -- [place] Invasive Cardiovascular services

## 2021-04-14 ENCOUNTER — Telehealth: Payer: Self-pay | Admitting: Cardiology

## 2021-04-14 ENCOUNTER — Telehealth: Payer: Self-pay | Admitting: *Deleted

## 2021-04-14 LAB — CBC
Hematocrit: 46.9 % (ref 37.5–51.0)
Hemoglobin: 15.9 g/dL (ref 13.0–17.7)
MCH: 32.3 pg (ref 26.6–33.0)
MCHC: 33.9 g/dL (ref 31.5–35.7)
MCV: 95 fL (ref 79–97)
Platelets: 194 10*3/uL (ref 150–450)
RBC: 4.93 x10E6/uL (ref 4.14–5.80)
RDW: 13.1 % (ref 11.6–15.4)
WBC: 6.5 10*3/uL (ref 3.4–10.8)

## 2021-04-14 LAB — BASIC METABOLIC PANEL
BUN/Creatinine Ratio: 16 (ref 10–24)
BUN: 12 mg/dL (ref 8–27)
CO2: 26 mmol/L (ref 20–29)
Calcium: 9.4 mg/dL (ref 8.6–10.2)
Chloride: 97 mmol/L (ref 96–106)
Creatinine, Ser: 0.74 mg/dL — ABNORMAL LOW (ref 0.76–1.27)
Glucose: 134 mg/dL — ABNORMAL HIGH (ref 65–99)
Potassium: 5.4 mmol/L — ABNORMAL HIGH (ref 3.5–5.2)
Sodium: 140 mmol/L (ref 134–144)
eGFR: 104 mL/min/{1.73_m2} (ref >59)

## 2021-04-14 NOTE — Telephone Encounter (Signed)
No answer, voicemail message. 

## 2021-04-14 NOTE — Telephone Encounter (Signed)
Patient was returning call 

## 2021-04-14 NOTE — Telephone Encounter (Signed)
Opened chart to find who contacted PT, PT returning phone call

## 2021-04-14 NOTE — Telephone Encounter (Signed)
Pt contacted pre-catheterization scheduled at [facility] for: Thursday Apr 15, 2021 10:30 AM Verified arrival time and place: [facility] Main Entrance A [facility]) at: 8:30 AM   No solid food after midnight prior to cath, clear liquids until 5 AM day of procedure.  Hold: Jardiance-AM of procedure Metformin-day of procedure and 48 hours post procedure  Except hold medications AM meds can be  taken pre-cath with sips of water including: ASA 81 mg   Confirmed patient has responsible adult to drive home post procedure and be with patient first 24 hours after arriving home:  You are allowed ONE visitor in the waiting room during the time you are at the hospital for your procedure. Both you and your visitor must wear a mask once you enter the hospital.   Patient reports does not currently have any symptoms concerning for COVID-19 and no household members with COVID-19 like illness.

## 2021-04-14 NOTE — Telephone Encounter (Signed)
Reviewed procedure/mask/visitor instructions with patient.  See BMP results 04/13/21-discussed with patient, he is aware will get BMP at hospital tomorrow morning when he comes in for cath.

## 2021-04-15 ENCOUNTER — Ambulatory Visit (HOSPITAL_COMMUNITY)
Admission: RE | Admit: 2021-04-15 | Discharge: 2021-04-15 | Disposition: A | Discharge status: Home / Self Care | Payer: BC Managed Care – PPO | Source: Ambulatory Visit | Attending: Interventional Cardiology | Admitting: Interventional Cardiology

## 2021-04-15 ENCOUNTER — Encounter (HOSPITAL_COMMUNITY)
Admission: RE | Disposition: A | Discharge status: Home / Self Care | Payer: Self-pay | Source: Ambulatory Visit | Attending: Interventional Cardiology

## 2021-04-15 ENCOUNTER — Other Ambulatory Visit: Payer: Self-pay

## 2021-04-15 DIAGNOSIS — I25110 Atherosclerotic heart disease of native coronary artery with unstable angina pectoris: Secondary | ICD-10-CM | POA: Insufficient documentation

## 2021-04-15 DIAGNOSIS — E119 Type 2 diabetes mellitus without complications: Secondary | ICD-10-CM | POA: Diagnosis not present

## 2021-04-15 DIAGNOSIS — Z79899 Other long term (current) drug therapy: Secondary | ICD-10-CM | POA: Insufficient documentation

## 2021-04-15 DIAGNOSIS — Z7982 Long term (current) use of aspirin: Secondary | ICD-10-CM | POA: Insufficient documentation

## 2021-04-15 DIAGNOSIS — K219 Gastro-esophageal reflux disease without esophagitis: Secondary | ICD-10-CM | POA: Diagnosis not present

## 2021-04-15 DIAGNOSIS — Z7984 Long term (current) use of oral hypoglycemic drugs: Secondary | ICD-10-CM | POA: Diagnosis not present

## 2021-04-15 DIAGNOSIS — I10 Essential (primary) hypertension: Secondary | ICD-10-CM | POA: Diagnosis not present

## 2021-04-15 DIAGNOSIS — I200 Unstable angina: Secondary | ICD-10-CM

## 2021-04-15 DIAGNOSIS — E785 Hyperlipidemia, unspecified: Secondary | ICD-10-CM | POA: Diagnosis not present

## 2021-04-15 HISTORY — PX: LEFT HEART CATH AND CORONARY ANGIOGRAPHY: CATH118249

## 2021-04-15 HISTORY — PX: INTRAVASCULAR PRESSURE WIRE/FFR STUDY: CATH118243

## 2021-04-15 LAB — BASIC METABOLIC PANEL
Anion gap: 12 (ref 5–15)
BUN: 13 mg/dL (ref 6–20)
CO2: 26 mmol/L (ref 22–32)
Calcium: 8.6 mg/dL — ABNORMAL LOW (ref 8.9–10.3)
Chloride: 100 mmol/L (ref 98–111)
Creatinine, Ser: 0.64 mg/dL (ref 0.61–1.24)
GFR, Estimated: >60 mL/min (ref >60)
Glucose, Bld: 162 mg/dL — ABNORMAL HIGH (ref 70–99)
Potassium: 4.2 mmol/L (ref 3.5–5.1)
Sodium: 138 mmol/L (ref 135–145)

## 2021-04-15 LAB — POCT ACTIVATED CLOTTING TIME: Activated Clotting Time: 285 seconds

## 2021-04-15 SURGERY — LEFT HEART CATH AND CORONARY ANGIOGRAPHY
Anesthesia: LOCAL

## 2021-04-15 MED ORDER — SODIUM CHLORIDE 0.9% FLUSH: 3.0000 mL | Freq: Two times a day (BID) | INTRAVENOUS | Status: DC

## 2021-04-15 MED ORDER — FENTANYL CITRATE (PF) 100 MCG/2ML IJ SOLN
INTRAMUSCULAR | Status: DC | PRN
  Administered 2021-04-15 (×2): 25 ug via INTRAVENOUS

## 2021-04-15 MED ORDER — MIDAZOLAM HCL 2 MG/2ML IJ SOLN
INTRAMUSCULAR | Status: DC | PRN
  Administered 2021-04-15: 1 mg via INTRAVENOUS
  Administered 2021-04-15: 2 mg via INTRAVENOUS

## 2021-04-15 MED ORDER — ASPIRIN 81 MG PO CHEW: 81.0000 mg | CHEWABLE_TABLET | ORAL | Status: DC

## 2021-04-15 MED ORDER — VERAPAMIL HCL 2.5 MG/ML IV SOLN
INTRAVENOUS | Status: DC | PRN
  Administered 2021-04-15 (×2): 10 mL via INTRA_ARTERIAL

## 2021-04-15 MED ORDER — ADENOSINE 12 MG/4ML IV SOLN
INTRAVENOUS | Status: AC
  Filled 2021-04-15: qty 16

## 2021-04-15 MED ORDER — HEPARIN SODIUM (PORCINE) 1000 UNIT/ML IJ SOLN
INTRAMUSCULAR | Status: AC
  Filled 2021-04-15: qty 1

## 2021-04-15 MED ORDER — MIDAZOLAM HCL 2 MG/2ML IJ SOLN
INTRAMUSCULAR | Status: AC
  Filled 2021-04-15: qty 2

## 2021-04-15 MED ORDER — HEPARIN (PORCINE) IN NACL 1000-0.9 UT/500ML-% IV SOLN
INTRAVENOUS | Status: DC | PRN
  Administered 2021-04-15 (×2): 500 mL

## 2021-04-15 MED ORDER — SODIUM CHLORIDE 0.9 % WEIGHT BASED INFUSION
3.0000 mL/kg/h | INTRAVENOUS | Status: AC
  Administered 2021-04-15: 3 mL/kg/h via INTRAVENOUS

## 2021-04-15 MED ORDER — ACETAMINOPHEN 325 MG PO TABS: 650.0000 mg | ORAL_TABLET | ORAL | Status: DC | PRN

## 2021-04-15 MED ORDER — ONDANSETRON HCL 4 MG/2ML IJ SOLN: 4.0000 mg | Freq: Four times a day (QID) | INTRAMUSCULAR | Status: DC | PRN

## 2021-04-15 MED ORDER — IOHEXOL 350 MG/ML SOLN
INTRAVENOUS | Status: DC | PRN
  Administered 2021-04-15: 80 mL via INTRA_ARTERIAL

## 2021-04-15 MED ORDER — SODIUM CHLORIDE 0.9% FLUSH: 3.0000 mL | INTRAVENOUS | Status: DC | PRN

## 2021-04-15 MED ORDER — VERAPAMIL HCL 2.5 MG/ML IV SOLN
INTRAVENOUS | Status: AC
  Filled 2021-04-15: qty 2

## 2021-04-15 MED ORDER — SODIUM CHLORIDE 0.9 % WEIGHT BASED INFUSION: 1.0000 mL/kg/h | INTRAVENOUS | Status: DC

## 2021-04-15 MED ORDER — HEPARIN SODIUM (PORCINE) 1000 UNIT/ML IJ SOLN
INTRAMUSCULAR | Status: DC | PRN
  Administered 2021-04-15: 5500 [IU] via INTRAVENOUS
  Administered 2021-04-15: 4500 [IU] via INTRAVENOUS

## 2021-04-15 MED ORDER — SODIUM CHLORIDE 0.9 % IV SOLN: 250.0000 mL | INTRAVENOUS | Status: DC | PRN

## 2021-04-15 MED ORDER — LABETALOL HCL 5 MG/ML IV SOLN: 10.0000 mg | INTRAVENOUS | Status: DC | PRN

## 2021-04-15 MED ORDER — LIDOCAINE HCL (PF) 1 % IJ SOLN
INTRAMUSCULAR | Status: AC
  Filled 2021-04-15: qty 30

## 2021-04-15 MED ORDER — HEPARIN (PORCINE) IN NACL 1000-0.9 UT/500ML-% IV SOLN
INTRAVENOUS | Status: AC
  Filled 2021-04-15: qty 1000

## 2021-04-15 MED ORDER — FENTANYL CITRATE (PF) 100 MCG/2ML IJ SOLN
INTRAMUSCULAR | Status: AC
  Filled 2021-04-15: qty 2

## 2021-04-15 MED ORDER — LIDOCAINE HCL (PF) 1 % IJ SOLN
INTRAMUSCULAR | Status: DC | PRN
  Administered 2021-04-15: 5 mL via INTRADERMAL

## 2021-04-15 MED ORDER — SODIUM CHLORIDE 0.9 % IV SOLN: INTRAVENOUS | Status: AC

## 2021-04-15 MED ORDER — METFORMIN HCL 500 MG PO TABS: 1000.0000 mg | ORAL_TABLET | Freq: Two times a day (BID) | ORAL | Status: AC

## 2021-04-15 MED ORDER — ADENOSINE (DIAGNOSTIC) 140MCG/KG/MIN
INTRAVENOUS | Status: AC | PRN
  Administered 2021-04-15: 140 ug/kg/min via INTRAVENOUS

## 2021-04-15 MED ORDER — HYDRALAZINE HCL 20 MG/ML IJ SOLN: 10.0000 mg | INTRAMUSCULAR | Status: DC | PRN

## 2021-04-15 SURGICAL SUPPLY — 14 items
CATH 5FR JL3.5 JR4 ANG PIG MP (CATHETERS) ×2 IMPLANT
CATH INFINITI 5FR AL1 (CATHETERS) ×2 IMPLANT
CATH LAUNCHER 5F AL1 (CATHETERS) ×1 IMPLANT
CATHETER LAUNCHER 5F AL1 (CATHETERS) ×2
DEVICE RAD COMP TR BAND LRG (VASCULAR PRODUCTS) ×2 IMPLANT
GLIDESHEATH SLEND SS 6F .021 (SHEATH) ×2 IMPLANT
GUIDEWIRE INQWIRE 1.5J.035X260 (WIRE) ×3 IMPLANT
GUIDEWIRE PRESSURE X 175 (WIRE) ×2 IMPLANT
INQWIRE 1.5J .035X260CM (WIRE) ×6
KIT ESSENTIALS PG (KITS) ×2 IMPLANT
KIT HEART LEFT (KITS) ×2 IMPLANT
PACK CARDIAC CATHETERIZATION (CUSTOM PROCEDURE TRAY) ×2 IMPLANT
TRANSDUCER W/STOPCOCK (MISCELLANEOUS) ×2 IMPLANT
TUBING CIL FLEX 10 FLL-RA (TUBING) ×2 IMPLANT

## 2021-04-15 NOTE — Interval H&P Note (Signed)
Cath Lab Visit (complete for each Cath Lab visit)  Clinical Evaluation Leading to the Procedure:   ACS: No.  Non-ACS:    Anginal Classification: CCS III  Anti-ischemic medical therapy: Minimal Therapy (1 class of medications)  Non-Invasive Test Results: No non-invasive testing performed  Prior CABG: No previous CABG      History and Physical Interval Note:  04/15/2021 10:34 AM  Jacob Downs  has presented today for surgery, with the diagnosis of unstable angina.  The various methods of treatment have been discussed with the patient and family. After consideration of risks, benefits and other options for treatment, the patient has consented to  Procedure(s): LEFT HEART CATH AND CORONARY ANGIOGRAPHY (N/A) as a surgical intervention.  The patient's history has been reviewed, patient examined, no change in status, stable for surgery.  I have reviewed the patient's chart and labs.  Questions were answered to the patient's satisfaction.     Lance Muss

## 2021-04-15 NOTE — Progress Notes (Signed)
TCTS consulted for outpatient CABG evaluation. TCTS office will call the patient with an appointment. 

## 2021-04-15 NOTE — Discharge Instructions (Signed)
Radial Site Care ELEVATE RIGHT WRIST 24 HOURS   This sheet gives you information about how to care for yourself after your procedure. Your health care provider may also give you more specific instructions. If you have problems or questions, contact your health care provider. What can I expect after the procedure? After the procedure, it is common to have:  Bruising and tenderness at the catheter insertion area. Follow these instructions at home: Medicines  Take over-the-counter and prescription medicines only as told by your health care provider. Insertion site care  Follow instructions from your health care provider about how to take care of your insertion site. Make sure you: ? Wash your hands with soap and water before you change your bandage (dressing). If soap and water are not available, use hand sanitizer. ? Change your dressing as told by your health care provider. ? Leave stitches (sutures), skin glue, or adhesive strips in place. These skin closures may need to stay in place for 2 weeks or longer. If adhesive strip edges start to loosen and curl up, you may trim the loose edges. Do not remove adhesive strips completely unless your health care provider tells you to do that.  Check your insertion site every day for signs of infection. Check for: ? Redness, swelling, or pain. ? Fluid or blood. ? Pus or a bad smell. ? Warmth.  Do not take baths, swim, or use a hot tub until your health care provider approves.  You may shower 24-48 hours after the procedure, or as directed by your health care provider. ? Remove the dressing and gently wash the site with plain soap and water. ? Pat the area dry with a clean towel. ? Do not rub the site. That could cause bleeding.  Do not apply powder or lotion to the site. Activity  For 24 hours after the procedure, or as directed by your health care provider: ? Do not flex or bend the affected arm. ? Do not push or pull heavy objects with  the affected arm. ? Do not drive yourself home from the hospital or clinic. You may drive 24 hours after the procedure unless your health care provider tells you not to. ? Do not operate machinery or power tools.  Do not lift anything that is heavier than 10 lb (4.5 kg), or the limit that you are told, until your health care provider says that it is safe.  Ask your health care provider when it is okay to: ? Return to work or school. ? Resume usual physical activities or sports. ? Resume sexual activity.   General instructions  If the catheter site starts to bleed, raise your arm and put firm pressure on the site. If the bleeding does not stop, get help right away. This is a medical emergency.  If you went home on the same day as your procedure, a responsible adult should be with you for the first 24 hours after you arrive home.  Keep all follow-up visits as told by your health care provider. This is important. Contact a health care provider if:  You have a fever.  You have redness, swelling, or yellow drainage around your insertion site. Get help right away if:  You have unusual pain at the radial site.  The catheter insertion area swells very fast.  The insertion area is bleeding, and the bleeding does not stop when you hold steady pressure on the area.  Your arm or hand becomes pale, cool, tingly, or numb.   These symptoms may represent a serious problem that is an emergency. Do not wait to see if the symptoms will go away. Get medical help right away. Call your local emergency services (911 in the [place] Do not drive yourself to the hospital. Summary  After the procedure, it is common to have bruising and tenderness at the site.  Follow instructions from your health care provider about how to take care of your radial site wound. Check the wound every day for signs of infection.  Do not lift anything that is heavier than 10 lb (4.5 kg), or the limit that you are told, until your  health care provider says that it is safe. This information is not intended to replace advice given to you by your health care provider. Make sure you discuss any questions you have with your health care provider. Document Revised: 12/13/2017 Document Reviewed: 12/13/2017 Elsevier Patient Education  2021 Elsevier Inc.  

## 2021-04-16 ENCOUNTER — Encounter (HOSPITAL_COMMUNITY): Payer: Self-pay | Admitting: Interventional Cardiology

## 2021-04-21 ENCOUNTER — Other Ambulatory Visit: Payer: Self-pay | Admitting: *Deleted

## 2021-04-21 ENCOUNTER — Institutional Professional Consult (permissible substitution) (INDEPENDENT_AMBULATORY_CARE_PROVIDER_SITE_OTHER): Payer: BC Managed Care – PPO | Admitting: Cardiothoracic Surgery

## 2021-04-21 ENCOUNTER — Other Ambulatory Visit: Payer: Self-pay

## 2021-04-21 VITALS — BP 110/71 | HR 80 | Resp 20 | Ht 72.0 in | Wt 253.0 lb

## 2021-04-21 DIAGNOSIS — I2510 Atherosclerotic heart disease of native coronary artery without angina pectoris: Secondary | ICD-10-CM

## 2021-04-21 MED ORDER — IPRATROPIUM-ALBUTEROL 20-100 MCG/ACT IN AERS: 1.0000 | INHALATION_SPRAY | Freq: Four times a day (QID) | RESPIRATORY_TRACT | Status: DC | PRN

## 2021-04-21 NOTE — Progress Notes (Signed)
[address]       [place] [address]             [phone]     CARDIOTHORACIC SURGERY CONSULTATION REPORT  Referring Provider is Corky Crafts, MD Primary Cardiologist is Olga Millers, MD PCP is Catha Gosselin, MD  Chief Complaint  Patient presents with  . Coronary Artery Disease    Surgical consult, Cardiac Cath 04/15/21    HPI:  60 year old man with 5-year history of coronary artery disease status post left heart catheterization in 2017 which demonstrated single-vessel disease of the LAD is now referred for consideration of CABG status post left heart catheterization last week which showed three-vessel disease.  This was done to assess exertional angina which had worsened in the past month or so.  His pain particularly arose with heavy exertion and responded to sublingual nitroglycerin.  The patient also notes cough which is sometimes productive of clear sputum but without fever.  This has been going on for several weeks.  He has tested negative for flu and COVID.  He has not started any new medications which might explain the new cough.  His cardiac risk factors are otherwise notable for diabetes, hyperlipidemia, and hypertension. Past Medical History:  Diagnosis Date  . Arthritis   . Cellulitis of left hand 11/27/2015  . Diabetes mellitus without complication (HCC)   . GERD (gastroesophageal reflux disease)   . Hyperlipemia   . Shingles    4-5 years ago  . 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 CARDIAC CATHETERIZATION N/A 11/30/2015   Procedure: Left Heart Cath and Coronary Angiography;  Surgeon: Kathleene Hazel, MD;  Location: [facility] INVASIVE CV LAB;  Service: Cardiovascular;  Laterality: N/A;  . COLONOSCOPY    . I & D EXTREMITY Right 11/26/2014   Procedure: INCISION AND DRAINAGE OF RIGHT INDEX FINGER;  Surgeon: Dairl Ponder, MD;  Location: [facility] OR;  Service: Orthopedics;  Laterality:  Right;  . INTRAVASCULAR PRESSURE WIRE/FFR STUDY N/A 04/15/2021   Procedure: INTRAVASCULAR PRESSURE WIRE/FFR STUDY;  Surgeon: Corky Crafts, MD;  Location: [facility] INVASIVE CV LAB;  Service: Cardiovascular;  Laterality: N/A;  . LEFT HEART CATH AND CORONARY ANGIOGRAPHY N/A 04/15/2021   Procedure: LEFT HEART CATH AND CORONARY ANGIOGRAPHY;  Surgeon: Corky Crafts, MD;  Location: [facility] INVASIVE CV LAB;  Service: Cardiovascular;  Laterality: N/A;  . ORIF ANKLE FRACTURE  10/03/2012   Procedure: OPEN REDUCTION INTERNAL FIXATION (ORIF) ANKLE FRACTURE;  Surgeon: Nestor Lewandowsky, MD;  Location: [facility];  Service: Orthopedics;  Laterality: Left;  . PLANTAR FASCIA SURGERY  06,07   both feet  . TONSILLECTOMY     as a child    Family History  Problem Relation Age of Onset  . Colon cancer Neg Hx   . CAD Other        No family history    Social History   Socioeconomic History  . Marital status: Married    Spouse name: Not on file  . Number of children: 2  . Years of education: Not on file  . Highest education level: Not on file  Occupational History  . 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 Smoking status: Never Smoker  . Smokeless tobacco: Never Used  Substance and Sexual Activity  . Alcohol use: Yes    Alcohol/week: 0.0 standard drinks    Comment: 6 to 12  beers per week  . Drug use: No  . Sexual activity: Not on file  Other Topics Concern  . Not on file  Social History Narrative  . Not on file   Social Determinants of Health   Financial Resource Strain: Not on file  Food Insecurity: Not on file  Transportation Needs: Not on file  Physical Activity: Not on file  Stress: Not on file  Social Connections: Not on file  Intimate Partner Violence: Not on file    Current Outpatient Medications  Medication Sig Dispense Refill  . aspirin 81 MG tablet Take 81 mg by mouth daily.    Marland Kitchen atorvastatin (LIPITOR) 80 MG tablet TAKE 1 TABLET(80 MG) BY MOUTH EVERY EVENING  (Patient taking differently: Take 80 mg by mouth at bedtime.) 90 tablet 3  . Cholecalciferol (VITAMIN D3 MAXIMUM STRENGTH) 125 MCG (5000 UT) capsule Take 5,000 Units by mouth daily.    . famotidine (PEPCID) 20 MG tablet Take 40 mg by mouth at bedtime.    . isosorbide mononitrate (IMDUR) 60 MG 24 hr tablet Take 1.5 tablets (90 mg total) by mouth daily. 45 tablet 3  . JARDIANCE 25 MG TABS tablet Take 25 mg by mouth daily.    . metFORMIN (GLUCOPHAGE) 500 MG tablet Take 2 tablets (1,000 mg total) by mouth 2 (two) times daily with a meal.    . metoprolol tartrate (LOPRESSOR) 25 MG tablet Take 1 tablet (25 mg total) by mouth 2 (two) times daily. 60 tablet 3  . nitroGLYCERIN (NITROSTAT) 0.4 MG SL tablet DISSOLVE 1 TABLET UNDER THE TONGUE EVERY 5 MINUTES FOR 3 DOSES AS NEEDED FOR CHEST PAIN (Patient taking differently: Place 0.4 mg under the tongue every 5 (five) minutes as needed for chest pain. DISSOLVE 1 TABLET UNDER THE TONGUE EVERY 5 MINUTES FOR 3 DOSES AS NEEDED FOR CHEST PAIN) 25 tablet 2  . Omega-3 Fatty Acids (OMEGA 3 PO) Take 600 mg by mouth daily.     Current Facility-Administered Medications  Medication Dose Route Frequency Provider Last Rate Last Admin  . Ipratropium-Albuterol (COMBIVENT) respimat 1 puff  1 puff Inhalation Q6H PRN Linden Dolin, MD        Allergies  Allergen Reactions  . Glimepiride Other (See Comments)    Other reaction(s): drops sugar too low      Review of Systems:   General:  Reduced energy level no fevers  Cardiac:  As per HPI; no palpitations or atrial fibrillation  Respiratory:  As per HPI with chronic cough  GI:   History of reflux disease no abdominal pain  GU:   History of left flank pain   Vascular:  No claudication  Neuro:   Denies strokes and TIAs  Musculoskeletal: Negative  Skin:   Negative  Psych:   Negative  Eyes:   Negative  ENT:   Negative  Hematologic:  Not on blood thinners  Endocrine:   diabetes, does not check CBG's at home  reliably     Physical Exam:   BP 110/71   Pulse 80   Resp 20   Ht 6' (1.829 m)   Wt 114.8 kg   SpO2 96% Comment: RA  BMI 34.31 kg/m   General:   well-appearing  HEENT:  Unremarkable   Neck:   no JVD, no bruits, no adenopathy   Chest:   clear to auscultation, symmetrical breath sounds, no wheezes, no rhonchi   CV:   RRR, no detectable murmur   Abdomen:  soft, non-tender, no masses  Extremities:  warm, well-perfused, pulses intact throughout, no LE edema  Rectal/GU  Deferred  Neuro:   Grossly non-focal and symmetrical throughout  Skin:   Clean and dry, no rashes, no breakdown   Diagnostic Tests:  I have personally reviewed his available imaging studies including his most recent left heart catheterization from 04/15/2021 which demonstrates severe multivessel coronary artery disease.  I agree with the interpretation.   Impression:  Pleasant 60 year old man with longstanding coronary disease now with evidence for three-vessel disease.  As a diabetic, he is best served with CABG.  He is a good candidate for the procedure pending the completion of his routine work-up   Plan:  CABG with possible left radial artery harvesting and possible bilateral internal mammary artery harvesting on 04/27/2021  Complete the work-up in the meantime; this should include transthoracic echocardiogram vascular ultrasonography to exclude extracranial cerebrovascular disease and to evaluate the palmar arch anatomy particular the left hand, and CT scan of the chest to evaluate chronic cough  Prescription given for inhaler to address his cough symptoms  Would also need hemoglobin A1c as part of the blood work before surgery to assess his diabetic status  I spent in excess of 45 minutes during the conduct of this office consultation and >50% of this time involved direct face-to-face encounter with the patient for counseling and/or coordination of their care.          Level 3 Office Consult = 40  minutes         Level 4 Office Consult = 60 minutes         Level 5 Office Consult = 80 minutes  B.  Lorayne Marek, MD 04/21/2021 4:50 PM

## 2021-04-22 ENCOUNTER — Other Ambulatory Visit: Payer: Self-pay | Admitting: *Deleted

## 2021-04-22 DIAGNOSIS — R059 Cough, unspecified: Secondary | ICD-10-CM

## 2021-04-22 DIAGNOSIS — R053 Chronic cough: Secondary | ICD-10-CM

## 2021-04-22 DIAGNOSIS — Z01818 Encounter for other preprocedural examination: Secondary | ICD-10-CM

## 2021-04-22 MED ORDER — COMBIVENT RESPIMAT 20-100 MCG/ACT IN AERS
1.0000 | INHALATION_SPRAY | Freq: Four times a day (QID) | RESPIRATORY_TRACT | 0 refills | Status: DC
Start: 2021-04-22 — End: 2021-05-03

## 2021-04-23 ENCOUNTER — Encounter (HOSPITAL_COMMUNITY): Payer: Self-pay

## 2021-04-23 ENCOUNTER — Encounter (HOSPITAL_COMMUNITY)
Admission: RE | Admit: 2021-04-23 | Discharge: 2021-04-23 | Disposition: A | Discharge status: Home / Self Care | Payer: BC Managed Care – PPO | Source: Ambulatory Visit | Attending: Cardiothoracic Surgery | Admitting: Cardiothoracic Surgery

## 2021-04-23 ENCOUNTER — Ambulatory Visit (HOSPITAL_COMMUNITY)
Admission: RE | Admit: 2021-04-23 | Discharge: 2021-04-23 | Disposition: A | Discharge status: Home / Self Care | Payer: BC Managed Care – PPO | Source: Ambulatory Visit | Attending: Cardiothoracic Surgery | Admitting: Cardiothoracic Surgery

## 2021-04-23 ENCOUNTER — Ambulatory Visit (HOSPITAL_BASED_OUTPATIENT_CLINIC_OR_DEPARTMENT_OTHER)
Admission: RE | Admit: 2021-04-23 | Discharge: 2021-04-23 | Disposition: A | Discharge status: Home / Self Care | Payer: BC Managed Care – PPO | Source: Ambulatory Visit | Attending: Cardiothoracic Surgery | Admitting: Cardiothoracic Surgery

## 2021-04-23 ENCOUNTER — Other Ambulatory Visit: Payer: Self-pay

## 2021-04-23 DIAGNOSIS — I2510 Atherosclerotic heart disease of native coronary artery without angina pectoris: Secondary | ICD-10-CM

## 2021-04-23 DIAGNOSIS — Z791 Long term (current) use of non-steroidal anti-inflammatories (NSAID): Secondary | ICD-10-CM | POA: Insufficient documentation

## 2021-04-23 DIAGNOSIS — E785 Hyperlipidemia, unspecified: Secondary | ICD-10-CM | POA: Insufficient documentation

## 2021-04-23 DIAGNOSIS — Z8611 Personal history of tuberculosis: Secondary | ICD-10-CM | POA: Insufficient documentation

## 2021-04-23 DIAGNOSIS — Z20822 Contact with and (suspected) exposure to covid-19: Secondary | ICD-10-CM | POA: Insufficient documentation

## 2021-04-23 DIAGNOSIS — I10 Essential (primary) hypertension: Secondary | ICD-10-CM | POA: Insufficient documentation

## 2021-04-23 DIAGNOSIS — Z79899 Other long term (current) drug therapy: Secondary | ICD-10-CM | POA: Insufficient documentation

## 2021-04-23 DIAGNOSIS — Z7984 Long term (current) use of oral hypoglycemic drugs: Secondary | ICD-10-CM | POA: Insufficient documentation

## 2021-04-23 DIAGNOSIS — E119 Type 2 diabetes mellitus without complications: Secondary | ICD-10-CM | POA: Insufficient documentation

## 2021-04-23 DIAGNOSIS — Z01818 Encounter for other preprocedural examination: Secondary | ICD-10-CM | POA: Insufficient documentation

## 2021-04-23 DIAGNOSIS — Z7982 Long term (current) use of aspirin: Secondary | ICD-10-CM | POA: Insufficient documentation

## 2021-04-23 HISTORY — DX: Essential (primary) hypertension: I10

## 2021-04-23 HISTORY — DX: Acute myocardial infarction, unspecified: I21.9

## 2021-04-23 HISTORY — DX: Angina pectoris, unspecified: I20.9

## 2021-04-23 HISTORY — DX: Atherosclerotic heart disease of native coronary artery without angina pectoris: I25.10

## 2021-04-23 LAB — URINALYSIS, ROUTINE W REFLEX MICROSCOPIC
Bacteria, UA: NONE SEEN
Bilirubin Urine: NEGATIVE
Glucose, UA: >=500 mg/dL — AB
Hgb urine dipstick: NEGATIVE
Ketones, ur: NEGATIVE mg/dL
Leukocytes,Ua: NEGATIVE
Nitrite: NEGATIVE
Protein, ur: NEGATIVE mg/dL
Specific Gravity, Urine: 1.03 (ref 1.005–1.030)
pH: 5 (ref 5.0–8.0)

## 2021-04-23 LAB — GLUCOSE, CAPILLARY: Glucose-Capillary: 220 mg/dL — ABNORMAL HIGH (ref 70–99)

## 2021-04-23 LAB — COMPREHENSIVE METABOLIC PANEL
ALT: 85 U/L — ABNORMAL HIGH (ref 0–44)
AST: 57 U/L — ABNORMAL HIGH (ref 15–41)
Albumin: 4 g/dL (ref 3.5–5.0)
Alkaline Phosphatase: 103 U/L (ref 38–126)
Anion gap: 9 (ref 5–15)
BUN: 16 mg/dL (ref 6–20)
CO2: 28 mmol/L (ref 22–32)
Calcium: 9.4 mg/dL (ref 8.9–10.3)
Chloride: 101 mmol/L (ref 98–111)
Creatinine, Ser: 0.73 mg/dL (ref 0.61–1.24)
GFR, Estimated: >60 mL/min (ref >60)
Glucose, Bld: 210 mg/dL — ABNORMAL HIGH (ref 70–99)
Potassium: 4.5 mmol/L (ref 3.5–5.1)
Sodium: 138 mmol/L (ref 135–145)
Total Bilirubin: 0.8 mg/dL (ref 0.3–1.2)
Total Protein: 7.1 g/dL (ref 6.5–8.1)

## 2021-04-23 LAB — CBC
HCT: 50.5 % (ref 39.0–52.0)
Hemoglobin: 17 g/dL (ref 13.0–17.0)
MCH: 31.8 pg (ref 26.0–34.0)
MCHC: 33.7 g/dL (ref 30.0–36.0)
MCV: 94.4 fL (ref 80.0–100.0)
Platelets: 234 10*3/uL (ref 150–400)
RBC: 5.35 MIL/uL (ref 4.22–5.81)
RDW: 13 % (ref 11.5–15.5)
WBC: 4.9 10*3/uL (ref 4.0–10.5)
nRBC: 0 % (ref 0.0–0.2)

## 2021-04-23 LAB — PROTIME-INR
INR: 1.1 (ref 0.8–1.2)
Prothrombin Time: 13.8 seconds (ref 11.4–15.2)

## 2021-04-23 LAB — ECHOCARDIOGRAM COMPLETE
Area-P 1/2: 2.07 cm2
Height: 72 in
S' Lateral: 2.9 cm
Weight: 3955.2 oz

## 2021-04-23 LAB — APTT: aPTT: 31 seconds (ref 24–36)

## 2021-04-23 LAB — SURGICAL PCR SCREEN
MRSA, PCR: NEGATIVE
Staphylococcus aureus: POSITIVE — AB

## 2021-04-23 LAB — HEMOGLOBIN A1C
Hgb A1c MFr Bld: 7.9 % — ABNORMAL HIGH (ref 4.8–5.6)
Mean Plasma Glucose: 180 mg/dL

## 2021-04-23 LAB — SARS CORONAVIRUS 2 (TAT 6-24 HRS): SARS Coronavirus 2: NEGATIVE

## 2021-04-23 NOTE — Progress Notes (Signed)
Surgical Instructions    Your procedure is scheduled on 04/27/21.  Report to [facility] Main Entrance "A" at 5:30 A.M., then check in with the Admitting office.  Call this number if you have problems the morning of surgery:  [phone]   If you have any questions prior to your surgery date call [phone]: Open Monday-Friday 8am-4pm    Remember:  Do not eat after midnight the night before your surgery    Take these medicines the morning of surgery with A SIP OF WATER: Ipratropium-Albuterol (COMBIVENT RESPIMAT) isosorbide mononitrate (IMDUR) metoprolol tartrate (LOPRESSOR) nitroGLYCERIN (NITROSTAT) - if needed  As of today, STOP taking any Aspirin (unless otherwise instructed by your surgeon) Aleve, Naproxen, Ibuprofen, Motrin, Advil, Goody's, BC's, all herbal medications, fish oil, and all vitamins.  WHAT DO I DO ABOUT MY DIABETES MEDICATION?   Marland Kitchen Do not take oral diabetes medicines (pills) the morning of surgery.   HOW TO MANAGE YOUR DIABETES BEFORE AND AFTER SURGERY  Why is it important to control my blood sugar before and after surgery? . Improving blood sugar levels before and after surgery helps healing and can limit problems. . A way of improving blood sugar control is eating a healthy diet by: o  Eating less sugar and carbohydrates o  Increasing activity/exercise o  Talking with your doctor about reaching your blood sugar goals . High blood sugars (greater than 180 mg/dL) can raise your risk of infections and slow your recovery, so you will need to focus on controlling your diabetes during the weeks before surgery. . Make sure that the doctor who takes care of your diabetes knows about your planned surgery including the date and location.  How do I manage my blood sugar before surgery? . Check your blood sugar at least 4 times a day, starting 2 days before surgery, to make sure that the level is not too high or low.  . Check your blood sugar the morning of your  surgery when you wake up and every 2 hours until you get to the Short Stay unit.  o If your blood sugar is less than 70 mg/dL, you will need to treat for low blood sugar: - Do not take insulin. - Treat a low blood sugar (less than 70 mg/dL) with  cup of clear juice (cranberry or apple), 4 glucose tablets, OR glucose gel. - Recheck blood sugar in 15 minutes after treatment (to make sure it is greater than 70 mg/dL). If your blood sugar is not greater than 70 mg/dL on recheck, call [phone] for further instructions. . Report your blood sugar to the short stay nurse when you get to Short Stay.  . If you are admitted to the hospital after surgery: o Your blood sugar will be checked by the staff and you will probably be given insulin after surgery (instead of oral diabetes medicines) to make sure you have good blood sugar levels. o The goal for blood sugar control after surgery is 80-180 mg/dL.           Do not wear jewelry  Do not wear lotions, powders, colognes, or deodorant. Do not shave 48 hours prior to surgery.  Men may shave face and neck. Do not bring valuables to the hospital.              [facility] is not responsible for any belongings or valuables.  Do NOT Smoke (Tobacco/Vaping) or drink Alcohol 24 hours prior to your procedure If you use a CPAP at night,  you may bring all equipment for your overnight stay.   Contacts, glasses, dentures or bridgework may not be worn into surgery, please bring cases for these belongings   For patients admitted to the hospital, discharge time will be determined by your treatment team.   Patients discharged the day of surgery will not be allowed to drive home, and someone needs to stay with them for 24 hours.    Special instructions:    Oral Hygiene is also important to reduce your risk of infection.  Remember - BRUSH YOUR TEETH THE MORNING OF SURGERY WITH YOUR REGULAR TOOTHPASTE   [facility]- Preparing For Surgery  Before surgery,  you can play an important role. Because skin is not sterile, your skin needs to be as free of germs as possible. You can reduce the number of germs on your skin by washing with CHG (chlorahexidine gluconate) Soap before surgery.  CHG is an antiseptic cleaner which kills germs and bonds with the skin to continue killing germs even after washing.     Please do not use if you have an allergy to CHG or antibacterial soaps. If your skin becomes reddened/irritated stop using the CHG.  Do not shave (including legs and underarms) for at least 48 hours prior to first CHG shower. It is OK to shave your face.  Please follow these instructions carefully.    1.  Shower the NIGHT BEFORE SURGERY and the MORNING OF SURGERY with CHG Soap.   If you chose to wash your hair, wash your hair first as usual with your normal shampoo. After you shampoo, rinse your hair and body thoroughly to remove the shampoo.  Then [employer] and genitals (private parts) with your normal soap and rinse thoroughly to remove soap.  2. After that Use CHG Soap as you would any other liquid soap. You can apply CHG directly to the skin and wash gently with a scrungie or a clean washcloth.   3. Apply the CHG Soap to your body ONLY FROM THE NECK DOWN.  Do not use on open wounds or open sores. Avoid contact with your eyes, ears, mouth and genitals (private parts). Wash Face and genitals (private parts)  with your normal soap.   4. Wash thoroughly, paying special attention to the area where your surgery will be performed.  5. Thoroughly rinse your body with warm water from the neck down.  6. DO NOT shower/wash with your normal soap after using and rinsing off the CHG Soap.  7. Pat yourself dry with a CLEAN TOWEL.  8. Wear CLEAN PAJAMAS to bed the night before surgery  9. Place CLEAN SHEETS on your bed the night before your surgery  10. DO NOT SLEEP WITH PETS.   Day of Surgery:  Take a shower with CHG soap. Wear Clean/Comfortable  clothing the morning of surgery Do not apply any deodorants/lotions.   Remember to brush your teeth WITH YOUR REGULAR TOOTHPASTE.   Please read over the following fact sheets that you were given.

## 2021-04-23 NOTE — Progress Notes (Signed)
PCP: Aquilla Hacker, MD Cardiologist:  Olga Millers, MD  EKG: 04/15/21 CXR: 04/23/21 ECHO: 04/22/21 Stress Test: denies Cardiac Cath: 04/15/21  Fasting Blood Sugar- does not check BG.  It was 220 at PAT Checks Blood Sugar__0_ times a day  OSA/CPAP: No  ASA: Call MD for instructions Blood Thinners: No  Covid test 04/23/21 at PAT  Anesthesia Review: yes, cardiac history  Patient denies shortness of breath, fever, cough, and chest pain at PAT appointment.  Patient verbalized understanding of instructions provided today at the PAT appointment.  Patient asked to review instructions at home and day of surgery.

## 2021-04-23 NOTE — Progress Notes (Signed)
Pre CABG has been completed.   Preliminary results in CV Proc.   Blanch Media 04/23/2021 11:39 AM

## 2021-04-24 ENCOUNTER — Other Ambulatory Visit: Payer: Self-pay | Admitting: Cardiothoracic Surgery

## 2021-04-26 MED ORDER — CEFAZOLIN SODIUM-DEXTROSE 2-4 GM/100ML-% IV SOLN
2.0000 g | INTRAVENOUS | Status: DC
  Filled 2021-04-26 (×2): qty 100

## 2021-04-26 MED ORDER — TRANEXAMIC ACID (OHS) PUMP PRIME SOLUTION
2.0000 mg/kg | INTRAVENOUS | Status: DC
  Filled 2021-04-26: qty 2.24

## 2021-04-26 MED ORDER — EPINEPHRINE HCL 5 MG/250ML IV SOLN IN NS
0.0000 ug/min | INTRAVENOUS | Status: DC
Start: 2021-04-27 — End: 2021-04-27
  Filled 2021-04-26: qty 250

## 2021-04-26 MED ORDER — NITROGLYCERIN IN D5W 200-5 MCG/ML-% IV SOLN
2.0000 ug/min | INTRAVENOUS | Status: AC
  Administered 2021-04-27: 5 ug/min via INTRAVENOUS
  Filled 2021-04-26: qty 250

## 2021-04-26 MED ORDER — POTASSIUM CHLORIDE 2 MEQ/ML IV SOLN
80.0000 meq | INTRAVENOUS | Status: DC
  Filled 2021-04-26: qty 40

## 2021-04-26 MED ORDER — TRANEXAMIC ACID 1000 MG/10ML IV SOLN
1.5000 mg/kg/h | INTRAVENOUS | Status: AC
  Administered 2021-04-27: 1.5 mg/kg/h via INTRAVENOUS
  Filled 2021-04-26: qty 25

## 2021-04-26 MED ORDER — MILRINONE LACTATE IN DEXTROSE 20-5 MG/100ML-% IV SOLN
0.3000 ug/kg/min | INTRAVENOUS | Status: DC
  Filled 2021-04-26: qty 100

## 2021-04-26 MED ORDER — NOREPINEPHRINE 4 MG/250ML-% IV SOLN
0.0000 ug/min | INTRAVENOUS | Status: DC
  Filled 2021-04-26: qty 250

## 2021-04-26 MED ORDER — SODIUM CHLORIDE 0.9 % IV SOLN
INTRAVENOUS | Status: DC
  Filled 2021-04-26: qty 30

## 2021-04-26 MED ORDER — INSULIN REGULAR(HUMAN) IN NACL 100-0.9 UT/100ML-% IV SOLN
INTRAVENOUS | Status: AC
  Administered 2021-04-27: 10 [IU]/h via INTRAVENOUS
  Filled 2021-04-26: qty 100

## 2021-04-26 MED ORDER — PLASMA-LYTE A IV SOLN
INTRAVENOUS | Status: DC
  Filled 2021-04-26: qty 5

## 2021-04-26 MED ORDER — MAGNESIUM SULFATE 50 % IJ SOLN
40.0000 meq | INTRAMUSCULAR | Status: DC
  Filled 2021-04-26: qty 9.85

## 2021-04-26 MED ORDER — CEFAZOLIN SODIUM-DEXTROSE 2-4 GM/100ML-% IV SOLN
2.0000 g | INTRAVENOUS | Status: AC
  Administered 2021-04-27 (×2): 2 g via INTRAVENOUS
  Filled 2021-04-26: qty 100

## 2021-04-26 MED ORDER — DEXMEDETOMIDINE HCL IN NACL 400 MCG/100ML IV SOLN
0.1000 ug/kg/h | INTRAVENOUS | Status: AC
  Administered 2021-04-27: .5 ug/kg/h via INTRAVENOUS
  Filled 2021-04-26: qty 100

## 2021-04-26 MED ORDER — PHENYLEPHRINE HCL-NACL 20-0.9 MG/250ML-% IV SOLN
30.0000 ug/min | INTRAVENOUS | Status: AC
  Administered 2021-04-27: 30 ug/min via INTRAVENOUS
  Filled 2021-04-26: qty 250

## 2021-04-26 MED ORDER — VANCOMYCIN HCL 1500 MG/300ML IV SOLN
1500.0000 mg | INTRAVENOUS | Status: AC
  Administered 2021-04-27: 1500 mg via INTRAVENOUS
  Filled 2021-04-26: qty 300

## 2021-04-26 MED ORDER — TRANEXAMIC ACID (OHS) BOLUS VIA INFUSION
15.0000 mg/kg | INTRAVENOUS | Status: AC
  Administered 2021-04-27: 1681.5 mg via INTRAVENOUS
  Filled 2021-04-26: qty 1682

## 2021-04-26 NOTE — Progress Notes (Signed)
Anesthesia Chart Review:  Case: [id number] Date/Time: 04/27/21 0715   Procedures:      CORONARY ARTERY BYPASS GRAFTING (CABG) (N/A Chest) - POSSIBLE BIMA     possible RADIAL ARTERY HARVEST (Left Arm Lower)     TRANSESOPHAGEAL ECHOCARDIOGRAM (TEE) (N/A )     INDOCYANINE GREEN FLUORESCENCE IMAGING (ICG) (N/A )   Anesthesia type: General   Pre-op diagnosis: CAD   [facility] / [facility]   Surgeons: Linden Dolin, MD      DISCUSSION: 60 year old for CABG. History includes CAD (prior MI?; unstable angina 2017-medical therapy & 03/2021-CABG recommended), DM2, HTN, childhood TB.  Preoperative labs show mildly elevated LFTs which appears consistent with labs from April and May 2022. PLT count and PT/PTT normal. Lab tube for T&S could not be located by Lab, so needs T&S on the day of surgery. No result for ABG, so anticipate draw on day of surgery.   04/23/21 COVID-19 test negative. Anesthesia team to evaluate on the day of surgery.   VS: BP 119/81   Pulse 79   Temp 36.8 C (Oral)   Resp 17   Ht 6' (1.829 m)   Wt 112.1 kg   SpO2 96%   BMI 33.53 kg/m     PROVIDERS: Catha Gosselin, MD is PCP  Olga Millers, MD is cardiologist   LABS: Labs reviewed: Acceptable for surgery. See DISCUSSION. (all labs ordered are listed, but only abnormal results are displayed)  Labs Reviewed  SURGICAL PCR SCREEN - Abnormal; Notable for the following components:      Result Value   Staphylococcus aureus POSITIVE (*)    All other components within normal limits  GLUCOSE, CAPILLARY - Abnormal; Notable for the following components:   Glucose-Capillary 220 (*)    All other components within normal limits  COMPREHENSIVE METABOLIC PANEL - Abnormal; Notable for the following components:   Glucose, Bld 210 (*)    AST 57 (*)    ALT 85 (*)    All other components within normal limits  HEMOGLOBIN A1C - Abnormal; Notable for the following components:   Hgb A1c MFr Bld 7.9 (*)    All other components  within normal limits  URINALYSIS, ROUTINE W REFLEX MICROSCOPIC - Abnormal; Notable for the following components:   Glucose, UA >=500 (*)    All other components within normal limits  SARS CORONAVIRUS 2 (TAT 6-24 HRS)  APTT  CBC  PROTIME-INR    Preoperative EKG and CXR noted.   CV: Echo 04/23/21: IMPRESSIONS  1. Left ventricular ejection fraction, by estimation, is 60 to 65%. The  left ventricle has normal function. Left ventricular endocardial border  not optimally defined to evaluate regional wall motion. Left ventricular  diastolic parameters are consistent  with Grade I diastolic dysfunction (impaired relaxation).  2. Right ventricular systolic function is normal. The right ventricular  size is normal. Tricuspid regurgitation signal is inadequate for assessing  PA pressure.  3. The mitral valve is grossly normal. Trivial mitral valve  regurgitation. No evidence of mitral stenosis.  4. The aortic valve is tricuspid. Aortic valve regurgitation is not  visualized. No aortic stenosis is present.  5. The inferior vena cava is normal in size with greater than 50%  respiratory variability, suggesting right atrial pressure of 3 mmHg.  - Conclusion(s)/Recommendation(s): Normal biventricular function without  evidence of hemodynamically significant valvular heart disease.    Cardiac cath 04/15/21:  Ost LAD to Prox LAD lesion is 20% stenosed.  Prox LAD to Mid LAD lesion is 90% stenosed.  Mid LAD lesion is 70% stenosed.  1st Diag-1 lesion is 75% stenosed.  1st Diag-2 lesion is 75% stenosed.  2nd Mrg lesion is 75% stenosed.  1st Mrg lesion is 20% stenosed.  Prox RCA to Dist RCA lesion is 40% stenosed.  RPAV lesion is 30% stenosed.  Mid Cx lesion is 80% stenosed.  Ost Cx to Prox Cx lesion is 25% stenosed.  Prox RCA lesion is 60% stenosed. Significant by FFR.  The left ventricular systolic function is normal.  LV end diastolic pressure is normal.  The left  ventricular ejection fraction is 55-65% by visual estimate.  There is no aortic valve stenosis.   Severe disease in the heavily calcified LAD including bifurcation disease with large diagonal. Bifurcation lesion at the mid circumflex, OM.   RCA with angiographically moderate disease.  Heavily calcified.  FFR 0.77 after IV adenosine.   Plan for cardiac surgery consult for multivessel CAD.  He will needs grafts to LAD, large diagonal, OM2, OM3 and PDA.    Carotid US 04/23/21: Summary:  Right Carotid: Velocities in the right ICA are consistent with a 1-39%  stenosis.  Left Carotid: Velocities in the left ICA are consistent with a 1-39%  stenosis.  Vertebrals: Bilateral vertebral arteries demonstrate antegrade flow.    Past Medical History:  Diagnosis Date  . Anginal pain (HCC)   . Arthritis   . Cellulitis of left hand 11/27/2015  . Coronary artery disease   . Diabetes mellitus without complication (HCC)   . GERD (gastroesophageal reflux disease)   . Hyperlipemia   . Hypertension   . Myocardial infarction (HCC)   . Shingles    4-5 years ago  . 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 CARDIAC CATHETERIZATION N/A 11/30/2015   Procedure: Left Heart Cath and Coronary Angiography;  Surgeon: Kathleene Hazel, MD;  Location: [facility] INVASIVE CV LAB;  Service: Cardiovascular;  Laterality: N/A;  . COLONOSCOPY    . I & D EXTREMITY Right 11/26/2014   Procedure: INCISION AND DRAINAGE OF RIGHT INDEX FINGER;  Surgeon: Dairl Ponder, MD;  Location: [facility];  Service: Orthopedics;  Laterality: Right;  . INTRAVASCULAR PRESSURE WIRE/FFR STUDY N/A 04/15/2021   Procedure: INTRAVASCULAR PRESSURE WIRE/FFR STUDY;  Surgeon: Corky Crafts, MD;  Location: [facility] INVASIVE CV LAB;  Service: Cardiovascular;  Laterality: N/A;  . LEFT HEART CATH AND CORONARY ANGIOGRAPHY N/A 04/15/2021   Procedure: LEFT HEART CATH AND CORONARY ANGIOGRAPHY;  Surgeon: Corky Crafts, MD;  Location: [facility] INVASIVE CV LAB;  Service: Cardiovascular;  Laterality: N/A;  . ORIF ANKLE FRACTURE  10/03/2012   Procedure: OPEN REDUCTION INTERNAL FIXATION (ORIF) ANKLE FRACTURE;  Surgeon: Nestor Lewandowsky, MD;  Location: [facility];  Service: Orthopedics;  Laterality: Left;  . PLANTAR FASCIA SURGERY  06,07   both feet  . TONSILLECTOMY     as a child    MEDICATIONS: . aspirin EC 81 MG tablet  . atorvastatin (LIPITOR) 80 MG tablet  . Cholecalciferol (VITAMIN D3 MAXIMUM STRENGTH) 125 MCG (5000 UT) capsule  . famotidine (PEPCID) 20 MG tablet  . ibuprofen (ADVIL) 200 MG tablet  . Ipratropium-Albuterol (COMBIVENT RESPIMAT) 20-100 MCG/ACT AERS respimat  . isosorbide mononitrate (IMDUR) 60 MG 24 hr tablet  . JARDIANCE 25 MG TABS tablet  . metFORMIN (GLUCOPHAGE) 500 MG tablet  . metoprolol tartrate (LOPRESSOR) 25 MG tablet  .  nitroGLYCERIN (NITROSTAT) 0.4 MG SL tablet  . Omega-3 Fatty Acids (FISH OIL) 1200 MG CAPS  . pioglitazone (ACTOS) 15 MG tablet   . Ipratropium-Albuterol (COMBIVENT) respimat 1 puff   . [START ON 04/27/2021] ceFAZolin (ANCEF) IVPB 2g/100 mL premix  . [START ON 04/27/2021] ceFAZolin (ANCEF) IVPB 2g/100 mL premix  . [START ON 04/27/2021] dexmedetomidine (PRECEDEX) 400 MCG/100ML (4 mcg/mL) infusion  . [START ON 04/27/2021] EPINEPHrine (ADRENALIN) 4 mg in NS 250 mL (0.016 mg/mL) premix infusion  . [START ON 04/27/2021] heparin 30,000 units/NS 1000 mL solution for CELLSAVER  . [START ON 04/27/2021] heparin sodium (porcine) 5,000 Units, papaverine 60 mg in electrolyte-A (PLASMALYTE-A PH 7.4) 1,000 mL irrigation  . [START ON 04/27/2021] insulin regular, human (MYXREDLIN) 100 units/ 100 mL infusion  . [START ON 04/27/2021] magnesium sulfate (IV Push/IM) injection 40 mEq  . [START ON 04/27/2021] milrinone (PRIMACOR) 20 MG/100 ML (0.2 mg/mL) infusion  . [START ON 04/27/2021] nitroGLYCERIN 50 mg in dextrose 5 % 250 mL (0.2 mg/mL) infusion  . [START ON 04/27/2021]  norepinephrine (LEVOPHED) 4mg  in premix infusion  . [START ON 04/27/2021] phenylephrine (NEOSYNEPHRINE) 20-0.9 MG/250ML-% infusion  . [START ON 04/27/2021] potassium chloride injection 80 mEq  . [START ON 04/27/2021] tranexamic acid (CYKLOKAPRON) 2,500 mg in sodium chloride 0.9 % 250 mL (10 mg/mL) infusion  . [START ON 04/27/2021] tranexamic acid (CYKLOKAPRON) bolus via infusion - over 30 minutes 1,681.5 mg  . [START ON 04/27/2021] tranexamic acid (CYKLOKAPRON) pump prime solution 224 mg  . [START ON 04/27/2021] vancomycin (VANCOREADY) IVPB 1500 mg/300 mL    06/27/2021, PA-C Surgical Short Stay/Anesthesiology [facility] Phone [phone] [facility] Phone [phone] 04/26/2021 9:33 AM

## 2021-04-26 NOTE — Anesthesia Preprocedure Evaluation (Addendum)
Anesthesia Evaluation  Patient identified by MRN, date of birth, ID band Patient awake    Reviewed: Allergy & Precautions, H&P , NPO status , Patient's Chart, lab work & pertinent test results, reviewed documented beta blocker date and time   Airway Mallampati: III  TM Distance: >3 FB Neck ROM: Full    Dental no notable dental hx. (+) Teeth Intact, Dental Advisory Given   Pulmonary neg pulmonary ROS,    Pulmonary exam normal breath sounds clear to auscultation       Cardiovascular Exercise Tolerance: Good hypertension, Pt. on medications + angina + CAD and + Past MI   Rhythm:Regular Rate:Normal     Neuro/Psych negative neurological ROS  negative psych ROS   GI/Hepatic Neg liver ROS, GERD  ,  Endo/Other  diabetes, Type 2, Oral Hypoglycemic Agents  Renal/GU negative Renal ROS  negative genitourinary   Musculoskeletal  (+) Arthritis , Osteoarthritis,    Abdominal   Peds  Hematology negative hematology ROS (+)   Anesthesia Other Findings   Reproductive/Obstetrics negative OB ROS                           Anesthesia Physical Anesthesia Plan  ASA: IV  Anesthesia Plan: General   Post-op Pain Management:    Induction: Intravenous  PONV Risk Score and Plan: 2 and Midazolam and Treatment may vary due to age or medical condition  Airway Management Planned: Oral ETT  Additional Equipment: Arterial line, CVP, PA Cath, TEE and Ultrasound Guidance Line Placement  Intra-op Plan:   Post-operative Plan: Post-operative intubation/ventilation  Informed Consent: I have reviewed the patients History and Physical, chart, labs and discussed the procedure including the risks, benefits and alternatives for the proposed anesthesia with the patient or authorized representative who has indicated his/her understanding and acceptance.     Dental advisory given  Plan Discussed with: CRNA  Anesthesia  Plan Comments: (PAT note written 04/26/2021 by Shonna Chock, PA-C. Needs T&S & ABG day of surgery.  )      Anesthesia Quick Evaluation

## 2021-04-27 ENCOUNTER — Inpatient Hospital Stay (HOSPITAL_COMMUNITY)
Admission: RE | Admit: 2021-04-27 | Discharge: 2021-05-03 | DRG: 236 | Disposition: A | Discharge status: Home Health | Payer: BC Managed Care – PPO | Attending: Cardiothoracic Surgery | Admitting: Cardiothoracic Surgery

## 2021-04-27 ENCOUNTER — Inpatient Hospital Stay (HOSPITAL_COMMUNITY): Payer: BC Managed Care – PPO

## 2021-04-27 ENCOUNTER — Inpatient Hospital Stay (HOSPITAL_COMMUNITY)
Admission: RE | Disposition: A | Discharge status: Home Health | Payer: Self-pay | Source: Home / Self Care | Attending: Cardiothoracic Surgery

## 2021-04-27 ENCOUNTER — Inpatient Hospital Stay (HOSPITAL_COMMUNITY): Payer: BC Managed Care – PPO | Admitting: Vascular Surgery

## 2021-04-27 ENCOUNTER — Encounter (HOSPITAL_COMMUNITY): Payer: Self-pay | Admitting: Cardiothoracic Surgery

## 2021-04-27 ENCOUNTER — Other Ambulatory Visit: Payer: Self-pay

## 2021-04-27 ENCOUNTER — Inpatient Hospital Stay (HOSPITAL_COMMUNITY): Payer: BC Managed Care – PPO | Admitting: Anesthesiology

## 2021-04-27 DIAGNOSIS — E8770 Fluid overload, unspecified: Secondary | ICD-10-CM | POA: Diagnosis not present

## 2021-04-27 DIAGNOSIS — E785 Hyperlipidemia, unspecified: Secondary | ICD-10-CM | POA: Diagnosis present

## 2021-04-27 DIAGNOSIS — Z951 Presence of aortocoronary bypass graft: Secondary | ICD-10-CM

## 2021-04-27 DIAGNOSIS — E119 Type 2 diabetes mellitus without complications: Secondary | ICD-10-CM | POA: Diagnosis present

## 2021-04-27 DIAGNOSIS — I10 Essential (primary) hypertension: Secondary | ICD-10-CM | POA: Diagnosis present

## 2021-04-27 DIAGNOSIS — I4891 Unspecified atrial fibrillation: Secondary | ICD-10-CM | POA: Diagnosis not present

## 2021-04-27 DIAGNOSIS — K219 Gastro-esophageal reflux disease without esophagitis: Secondary | ICD-10-CM | POA: Diagnosis present

## 2021-04-27 DIAGNOSIS — I252 Old myocardial infarction: Secondary | ICD-10-CM | POA: Diagnosis not present

## 2021-04-27 DIAGNOSIS — D62 Acute posthemorrhagic anemia: Secondary | ICD-10-CM | POA: Diagnosis not present

## 2021-04-27 DIAGNOSIS — J939 Pneumothorax, unspecified: Secondary | ICD-10-CM

## 2021-04-27 DIAGNOSIS — Z9889 Other specified postprocedural states: Secondary | ICD-10-CM

## 2021-04-27 DIAGNOSIS — I2510 Atherosclerotic heart disease of native coronary artery without angina pectoris: Principal | ICD-10-CM | POA: Diagnosis present

## 2021-04-27 HISTORY — PX: RADIAL ARTERY HARVEST: SHX5067

## 2021-04-27 HISTORY — PX: TEE WITHOUT CARDIOVERSION: SHX5443

## 2021-04-27 HISTORY — PX: CORONARY ARTERY BYPASS GRAFT: SHX141

## 2021-04-27 LAB — POCT I-STAT, CHEM 8
BUN: 15 mg/dL (ref 6–20)
BUN: 13 mg/dL (ref 6–20)
BUN: 14 mg/dL (ref 6–20)
BUN: 12 mg/dL (ref 6–20)
BUN: 11 mg/dL (ref 6–20)
Calcium, Ion: 1.21 mmol/L (ref 1.15–1.40)
Calcium, Ion: 1.16 mmol/L (ref 1.15–1.40)
Calcium, Ion: 1.17 mmol/L (ref 1.15–1.40)
Calcium, Ion: 1.04 mmol/L — ABNORMAL LOW (ref 1.15–1.40)
Calcium, Ion: 0.99 mmol/L — ABNORMAL LOW (ref 1.15–1.40)
Chloride: 100 mmol/L (ref 98–111)
Chloride: 102 mmol/L (ref 98–111)
Chloride: 103 mmol/L (ref 98–111)
Chloride: 102 mmol/L (ref 98–111)
Chloride: 101 mmol/L (ref 98–111)
Creatinine, Ser: 0.5 mg/dL — ABNORMAL LOW (ref 0.61–1.24)
Creatinine, Ser: 0.5 mg/dL — ABNORMAL LOW (ref 0.61–1.24)
Creatinine, Ser: 0.5 mg/dL — ABNORMAL LOW (ref 0.61–1.24)
Creatinine, Ser: 0.5 mg/dL — ABNORMAL LOW (ref 0.61–1.24)
Creatinine, Ser: 0.4 mg/dL — ABNORMAL LOW (ref 0.61–1.24)
Glucose, Bld: 214 mg/dL — ABNORMAL HIGH (ref 70–99)
Glucose, Bld: 138 mg/dL — ABNORMAL HIGH (ref 70–99)
Glucose, Bld: 166 mg/dL — ABNORMAL HIGH (ref 70–99)
Glucose, Bld: 119 mg/dL — ABNORMAL HIGH (ref 70–99)
Glucose, Bld: 168 mg/dL — ABNORMAL HIGH (ref 70–99)
HCT: 40 % (ref 39.0–52.0)
HCT: 38 % — ABNORMAL LOW (ref 39.0–52.0)
HCT: 38 % — ABNORMAL LOW (ref 39.0–52.0)
HCT: 30 % — ABNORMAL LOW (ref 39.0–52.0)
HCT: 27 % — ABNORMAL LOW (ref 39.0–52.0)
Hemoglobin: 13.6 g/dL (ref 13.0–17.0)
Hemoglobin: 12.9 g/dL — ABNORMAL LOW (ref 13.0–17.0)
Hemoglobin: 12.9 g/dL — ABNORMAL LOW (ref 13.0–17.0)
Hemoglobin: 10.2 g/dL — ABNORMAL LOW (ref 13.0–17.0)
Hemoglobin: 9.2 g/dL — ABNORMAL LOW (ref 13.0–17.0)
Potassium: 4.3 mmol/L (ref 3.5–5.1)
Potassium: 4.1 mmol/L (ref 3.5–5.1)
Potassium: 4.3 mmol/L (ref 3.5–5.1)
Potassium: 4.8 mmol/L (ref 3.5–5.1)
Potassium: 4.4 mmol/L (ref 3.5–5.1)
Sodium: 139 mmol/L (ref 135–145)
Sodium: 138 mmol/L (ref 135–145)
Sodium: 138 mmol/L (ref 135–145)
Sodium: 137 mmol/L (ref 135–145)
Sodium: 138 mmol/L (ref 135–145)
TCO2: 31 mmol/L (ref 22–32)
TCO2: 26 mmol/L (ref 22–32)
TCO2: 27 mmol/L (ref 22–32)
TCO2: 28 mmol/L (ref 22–32)
TCO2: 26 mmol/L (ref 22–32)

## 2021-04-27 LAB — POCT I-STAT 7, (LYTES, BLD GAS, ICA,H+H)
Acid-Base Excess: 1 mmol/L (ref 0.0–2.0)
Acid-Base Excess: 4 mmol/L — ABNORMAL HIGH (ref 0.0–2.0)
Acid-Base Excess: 5 mmol/L — ABNORMAL HIGH (ref 0.0–2.0)
Acid-Base Excess: 2 mmol/L (ref 0.0–2.0)
Acid-Base Excess: 0 mmol/L (ref 0.0–2.0)
Acid-base deficit: 3 mmol/L — ABNORMAL HIGH (ref 0.0–2.0)
Acid-base deficit: 2 mmol/L (ref 0.0–2.0)
Bicarbonate: 29.7 mmol/L — ABNORMAL HIGH (ref 20.0–28.0)
Bicarbonate: 29.9 mmol/L — ABNORMAL HIGH (ref 20.0–28.0)
Bicarbonate: 28.5 mmol/L — ABNORMAL HIGH (ref 20.0–28.0)
Bicarbonate: 26.4 mmol/L (ref 20.0–28.0)
Bicarbonate: 26.2 mmol/L (ref 20.0–28.0)
Bicarbonate: 24.3 mmol/L (ref 20.0–28.0)
Bicarbonate: 24.7 mmol/L (ref 20.0–28.0)
Calcium, Ion: 1.21 mmol/L (ref 1.15–1.40)
Calcium, Ion: 0.98 mmol/L — ABNORMAL LOW (ref 1.15–1.40)
Calcium, Ion: 0.96 mmol/L — ABNORMAL LOW (ref 1.15–1.40)
Calcium, Ion: 0.99 mmol/L — ABNORMAL LOW (ref 1.15–1.40)
Calcium, Ion: 1.05 mmol/L — ABNORMAL LOW (ref 1.15–1.40)
Calcium, Ion: 1 mmol/L — ABNORMAL LOW (ref 1.15–1.40)
Calcium, Ion: 1.04 mmol/L — ABNORMAL LOW (ref 1.15–1.40)
HCT: 44 % (ref 39.0–52.0)
HCT: 33 % — ABNORMAL LOW (ref 39.0–52.0)
HCT: 29 % — ABNORMAL LOW (ref 39.0–52.0)
HCT: 29 % — ABNORMAL LOW (ref 39.0–52.0)
HCT: 33 % — ABNORMAL LOW (ref 39.0–52.0)
HCT: 31 % — ABNORMAL LOW (ref 39.0–52.0)
HCT: 32 % — ABNORMAL LOW (ref 39.0–52.0)
Hemoglobin: 15 g/dL (ref 13.0–17.0)
Hemoglobin: 11.2 g/dL — ABNORMAL LOW (ref 13.0–17.0)
Hemoglobin: 9.9 g/dL — ABNORMAL LOW (ref 13.0–17.0)
Hemoglobin: 9.9 g/dL — ABNORMAL LOW (ref 13.0–17.0)
Hemoglobin: 11.2 g/dL — ABNORMAL LOW (ref 13.0–17.0)
Hemoglobin: 10.5 g/dL — ABNORMAL LOW (ref 13.0–17.0)
Hemoglobin: 10.9 g/dL — ABNORMAL LOW (ref 13.0–17.0)
O2 Saturation: 100 %
O2 Saturation: 100 %
O2 Saturation: 100 %
O2 Saturation: 99 %
O2 Saturation: 97 %
O2 Saturation: 95 %
O2 Saturation: 97 %
Patient temperature: 35.9
Patient temperature: 36.7
Patient temperature: 36.9
Potassium: 4.3 mmol/L (ref 3.5–5.1)
Potassium: 4.2 mmol/L (ref 3.5–5.1)
Potassium: 5 mmol/L (ref 3.5–5.1)
Potassium: 4.4 mmol/L (ref 3.5–5.1)
Potassium: 4.1 mmol/L (ref 3.5–5.1)
Potassium: 4.3 mmol/L (ref 3.5–5.1)
Potassium: 4.4 mmol/L (ref 3.5–5.1)
Sodium: 139 mmol/L (ref 135–145)
Sodium: 141 mmol/L (ref 135–145)
Sodium: 139 mmol/L (ref 135–145)
Sodium: 139 mmol/L (ref 135–145)
Sodium: 142 mmol/L (ref 135–145)
Sodium: 142 mmol/L (ref 135–145)
Sodium: 142 mmol/L (ref 135–145)
TCO2: 31 mmol/L (ref 22–32)
TCO2: 31 mmol/L (ref 22–32)
TCO2: 30 mmol/L (ref 22–32)
TCO2: 28 mmol/L (ref 22–32)
TCO2: 28 mmol/L (ref 22–32)
TCO2: 26 mmol/L (ref 22–32)
TCO2: 26 mmol/L (ref 22–32)
pCO2 arterial: 60.7 mmHg — ABNORMAL HIGH (ref 32.0–48.0)
pCO2 arterial: 52.4 mmHg — ABNORMAL HIGH (ref 32.0–48.0)
pCO2 arterial: 39.3 mmHg (ref 32.0–48.0)
pCO2 arterial: 41.9 mmHg (ref 32.0–48.0)
pCO2 arterial: 47.1 mmHg (ref 32.0–48.0)
pCO2 arterial: 51.4 mmHg — ABNORMAL HIGH (ref 32.0–48.0)
pCO2 arterial: 51.9 mmHg — ABNORMAL HIGH (ref 32.0–48.0)
pH, Arterial: 7.297 — ABNORMAL LOW (ref 7.350–7.450)
pH, Arterial: 7.364 (ref 7.350–7.450)
pH, Arterial: 7.469 — ABNORMAL HIGH (ref 7.350–7.450)
pH, Arterial: 7.408 (ref 7.350–7.450)
pH, Arterial: 7.348 — ABNORMAL LOW (ref 7.350–7.450)
pH, Arterial: 7.281 — ABNORMAL LOW (ref 7.350–7.450)
pH, Arterial: 7.285 — ABNORMAL LOW (ref 7.350–7.450)
pO2, Arterial: 412 mmHg — ABNORMAL HIGH (ref 83.0–108.0)
pO2, Arterial: 253 mmHg — ABNORMAL HIGH (ref 83.0–108.0)
pO2, Arterial: 376 mmHg — ABNORMAL HIGH (ref 83.0–108.0)
pO2, Arterial: 153 mmHg — ABNORMAL HIGH (ref 83.0–108.0)
pO2, Arterial: 89 mmHg (ref 83.0–108.0)
pO2, Arterial: 88 mmHg (ref 83.0–108.0)
pO2, Arterial: 99 mmHg (ref 83.0–108.0)

## 2021-04-27 LAB — GLUCOSE, CAPILLARY
Glucose-Capillary: 103 mg/dL — ABNORMAL HIGH (ref 70–99)
Glucose-Capillary: 104 mg/dL — ABNORMAL HIGH (ref 70–99)
Glucose-Capillary: 108 mg/dL — ABNORMAL HIGH (ref 70–99)
Glucose-Capillary: 109 mg/dL — ABNORMAL HIGH (ref 70–99)
Glucose-Capillary: 109 mg/dL — ABNORMAL HIGH (ref 70–99)
Glucose-Capillary: 110 mg/dL — ABNORMAL HIGH (ref 70–99)
Glucose-Capillary: 111 mg/dL — ABNORMAL HIGH (ref 70–99)
Glucose-Capillary: 112 mg/dL — ABNORMAL HIGH (ref 70–99)
Glucose-Capillary: 114 mg/dL — ABNORMAL HIGH (ref 70–99)
Glucose-Capillary: 125 mg/dL — ABNORMAL HIGH (ref 70–99)
Glucose-Capillary: 194 mg/dL — ABNORMAL HIGH (ref 70–99)

## 2021-04-27 LAB — POCT I-STAT EG7
Acid-Base Excess: 2 mmol/L (ref 0.0–2.0)
Bicarbonate: 28.8 mmol/L — ABNORMAL HIGH (ref 20.0–28.0)
Calcium, Ion: 1.04 mmol/L — ABNORMAL LOW (ref 1.15–1.40)
HCT: 33 % — ABNORMAL LOW (ref 39.0–52.0)
Hemoglobin: 11.2 g/dL — ABNORMAL LOW (ref 13.0–17.0)
O2 Saturation: 81 %
Potassium: 4.9 mmol/L (ref 3.5–5.1)
Sodium: 142 mmol/L (ref 135–145)
TCO2: 30 mmol/L (ref 22–32)
pCO2, Ven: 53.2 mmHg (ref 44.0–60.0)
pH, Ven: 7.342 (ref 7.250–7.430)
pO2, Ven: 49 mmHg — ABNORMAL HIGH (ref 32.0–45.0)

## 2021-04-27 LAB — CBC
HCT: 35.5 % — ABNORMAL LOW (ref 39.0–52.0)
HCT: 34.7 % — ABNORMAL LOW (ref 39.0–52.0)
Hemoglobin: 11.9 g/dL — ABNORMAL LOW (ref 13.0–17.0)
Hemoglobin: 11.4 g/dL — ABNORMAL LOW (ref 13.0–17.0)
MCH: 32.6 pg (ref 26.0–34.0)
MCH: 32 pg (ref 26.0–34.0)
MCHC: 33.5 g/dL (ref 30.0–36.0)
MCHC: 32.9 g/dL (ref 30.0–36.0)
MCV: 97.3 fL (ref 80.0–100.0)
MCV: 97.5 fL (ref 80.0–100.0)
Platelets: 151 10*3/uL (ref 150–400)
Platelets: 137 10*3/uL — ABNORMAL LOW (ref 150–400)
RBC: 3.65 MIL/uL — ABNORMAL LOW (ref 4.22–5.81)
RBC: 3.56 MIL/uL — ABNORMAL LOW (ref 4.22–5.81)
RDW: 13.2 % (ref 11.5–15.5)
RDW: 13.1 % (ref 11.5–15.5)
WBC: 8.4 10*3/uL (ref 4.0–10.5)
WBC: 8 10*3/uL (ref 4.0–10.5)
nRBC: 0 % (ref 0.0–0.2)
nRBC: 0 % (ref 0.0–0.2)

## 2021-04-27 LAB — ECHO INTRAOPERATIVE TEE
Height: 72 in
S' Lateral: 3.55 cm
Weight: 3968 oz

## 2021-04-27 LAB — BASIC METABOLIC PANEL
Anion gap: 7 (ref 5–15)
BUN: 10 mg/dL (ref 6–20)
CO2: 25 mmol/L (ref 22–32)
Calcium: 7 mg/dL — ABNORMAL LOW (ref 8.9–10.3)
Chloride: 107 mmol/L (ref 98–111)
Creatinine, Ser: 0.57 mg/dL — ABNORMAL LOW (ref 0.61–1.24)
GFR, Estimated: >60 mL/min (ref >60)
Glucose, Bld: 103 mg/dL — ABNORMAL HIGH (ref 70–99)
Potassium: 4.3 mmol/L (ref 3.5–5.1)
Sodium: 139 mmol/L (ref 135–145)

## 2021-04-27 LAB — BLOOD GAS, ARTERIAL
Acid-Base Excess: 0.9 mmol/L (ref 0.0–2.0)
Bicarbonate: 25 mmol/L (ref 20.0–28.0)
Drawn by: 51906
FIO2: 21
O2 Saturation: 96.8 %
Patient temperature: 37
pCO2 arterial: 39.6 mmHg (ref 32.0–48.0)
pH, Arterial: 7.417 (ref 7.350–7.450)
pO2, Arterial: 90.3 mmHg (ref 83.0–108.0)

## 2021-04-27 LAB — PROTIME-INR
INR: 1.4 — ABNORMAL HIGH (ref 0.8–1.2)
Prothrombin Time: 16.9 seconds — ABNORMAL HIGH (ref 11.4–15.2)

## 2021-04-27 LAB — TYPE AND SCREEN
ABO/RH(D): O POS
Antibody Screen: NEGATIVE

## 2021-04-27 LAB — APTT: aPTT: 34 seconds (ref 24–36)

## 2021-04-27 LAB — ABO/RH: ABO/RH(D): O POS

## 2021-04-27 LAB — MAGNESIUM: Magnesium: 2.7 mg/dL — ABNORMAL HIGH (ref 1.7–2.4)

## 2021-04-27 LAB — HEMOGLOBIN AND HEMATOCRIT, BLOOD
HCT: 29.9 % — ABNORMAL LOW (ref 39.0–52.0)
Hemoglobin: 10.3 g/dL — ABNORMAL LOW (ref 13.0–17.0)

## 2021-04-27 LAB — PLATELET COUNT: Platelets: 156 10*3/uL (ref 150–400)

## 2021-04-27 SURGERY — CORONARY ARTERY BYPASS GRAFTING (CABG)
Anesthesia: General | Site: Chest | Laterality: Right

## 2021-04-27 MED ORDER — BISACODYL 10 MG RE SUPP: 10.0000 mg | Freq: Every day | RECTAL | Status: DC

## 2021-04-27 MED ORDER — LACTATED RINGERS IV SOLN: INTRAVENOUS | Status: DC | PRN

## 2021-04-27 MED ORDER — PHENYLEPHRINE 40 MCG/ML (10ML) SYRINGE FOR IV PUSH (FOR BLOOD PRESSURE SUPPORT)
PREFILLED_SYRINGE | INTRAVENOUS | Status: DC | PRN
  Administered 2021-04-27: 40 ug via INTRAVENOUS

## 2021-04-27 MED ORDER — HEPARIN SODIUM (PORCINE) 1000 UNIT/ML IJ SOLN
INTRAMUSCULAR | Status: DC | PRN
  Administered 2021-04-27: 30000 [IU] via INTRAVENOUS
  Administered 2021-04-27: 10000 [IU] via INTRAVENOUS

## 2021-04-27 MED ORDER — VANCOMYCIN HCL 1000 MG IV SOLR
INTRAVENOUS | Status: DC | PRN
  Administered 2021-04-27: 3 g via TOPICAL

## 2021-04-27 MED ORDER — PROPOFOL 10 MG/ML IV BOLUS
INTRAVENOUS | Status: DC | PRN
  Administered 2021-04-27: 5 mg via INTRAVENOUS

## 2021-04-27 MED ORDER — MIDAZOLAM HCL (PF) 5 MG/ML IJ SOLN
INTRAMUSCULAR | Status: DC | PRN
  Administered 2021-04-27 (×2): 3 mg via INTRAVENOUS
  Administered 2021-04-27 (×2): 2 mg via INTRAVENOUS

## 2021-04-27 MED ORDER — CHLORHEXIDINE GLUCONATE 0.12 % MT SOLN: 15.0000 mL | Freq: Once | OROMUCOSAL | Status: DC

## 2021-04-27 MED ORDER — ONDANSETRON HCL 4 MG/2ML IJ SOLN
4.0000 mg | Freq: Four times a day (QID) | INTRAMUSCULAR | Status: DC | PRN
  Administered 2021-04-27 – 2021-05-03 (×8): 4 mg via INTRAVENOUS
  Filled 2021-04-27 (×8): qty 2

## 2021-04-27 MED ORDER — 0.9 % SODIUM CHLORIDE (POUR BTL) OPTIME
TOPICAL | Status: DC | PRN
  Administered 2021-04-27: 5000 mL

## 2021-04-27 MED ORDER — PROPOFOL 10 MG/ML IV BOLUS
INTRAVENOUS | Status: AC
  Filled 2021-04-27: qty 20

## 2021-04-27 MED ORDER — PROTAMINE SULFATE 10 MG/ML IV SOLN
INTRAVENOUS | Status: AC
  Filled 2021-04-27: qty 50

## 2021-04-27 MED ORDER — THROMBIN 5000 UNITS EX SOLR
INTRAVENOUS | Status: DC | PRN
  Administered 2021-04-27: 2 mL

## 2021-04-27 MED ORDER — LACTATED RINGERS IV SOLN: INTRAVENOUS | Status: DC

## 2021-04-27 MED ORDER — BISACODYL 5 MG PO TBEC
10.0000 mg | DELAYED_RELEASE_TABLET | Freq: Every day | ORAL | Status: DC
  Administered 2021-04-28 – 2021-05-03 (×6): 10 mg via ORAL
  Filled 2021-04-27 (×6): qty 2

## 2021-04-27 MED ORDER — SODIUM CHLORIDE 0.9 % IV SOLN: INTRAVENOUS | Status: DC

## 2021-04-27 MED ORDER — PANTOPRAZOLE SODIUM 40 MG PO TBEC
40.0000 mg | DELAYED_RELEASE_TABLET | Freq: Every day | ORAL | Status: DC
  Administered 2021-04-29 – 2021-05-03 (×5): 40 mg via ORAL
  Filled 2021-04-27 (×5): qty 1

## 2021-04-27 MED ORDER — BUPIVACAINE LIPOSOME 1.3 % IJ SUSP
INTRAMUSCULAR | Status: DC | PRN
  Administered 2021-04-27: 50 mL

## 2021-04-27 MED ORDER — METOPROLOL TARTRATE 5 MG/5ML IV SOLN
2.5000 mg | INTRAVENOUS | Status: DC | PRN
  Filled 2021-04-27: qty 5

## 2021-04-27 MED ORDER — ALBUMIN HUMAN 5 % IV SOLN
250.0000 mL | INTRAVENOUS | Status: AC | PRN
  Administered 2021-04-27 (×4): 12.5 g via INTRAVENOUS
  Filled 2021-04-27 (×2): qty 250

## 2021-04-27 MED ORDER — FENTANYL CITRATE (PF) 250 MCG/5ML IJ SOLN
INTRAMUSCULAR | Status: DC | PRN
  Administered 2021-04-27: 50 ug via INTRAVENOUS
  Administered 2021-04-27: 100 ug via INTRAVENOUS
  Administered 2021-04-27: 450 ug via INTRAVENOUS
  Administered 2021-04-27: 100 ug via INTRAVENOUS
  Administered 2021-04-27 (×3): 150 ug via INTRAVENOUS
  Administered 2021-04-27: 100 ug via INTRAVENOUS

## 2021-04-27 MED ORDER — SODIUM CHLORIDE 0.9% FLUSH
3.0000 mL | INTRAVENOUS | Status: DC | PRN
  Administered 2021-05-01: 3 mL via INTRAVENOUS

## 2021-04-27 MED ORDER — HEMOSTATIC AGENTS (NO CHARGE) OPTIME
TOPICAL | Status: DC | PRN
  Administered 2021-04-27: 1 via TOPICAL

## 2021-04-27 MED ORDER — DEXTROSE 50 % IV SOLN
0.0000 mL | INTRAVENOUS | Status: DC | PRN
Start: 2021-04-27 — End: 2021-05-03

## 2021-04-27 MED ORDER — HEPARIN SODIUM (PORCINE) 1000 UNIT/ML IJ SOLN
INTRAMUSCULAR | Status: AC
  Filled 2021-04-27: qty 1

## 2021-04-27 MED ORDER — CHLORHEXIDINE GLUCONATE 4 % EX LIQD: 30.0000 mL | CUTANEOUS | Status: DC

## 2021-04-27 MED ORDER — ACETAMINOPHEN 650 MG RE SUPP
650.0000 mg | Freq: Once | RECTAL | Status: AC
  Administered 2021-04-27: 650 mg via RECTAL

## 2021-04-27 MED ORDER — POTASSIUM CHLORIDE 10 MEQ/50ML IV SOLN: 10.0000 meq | INTRAVENOUS | Status: AC

## 2021-04-27 MED ORDER — CHLORHEXIDINE GLUCONATE CLOTH 2 % EX PADS
6.0000 | MEDICATED_PAD | Freq: Every day | CUTANEOUS | Status: DC
  Administered 2021-04-27 – 2021-05-03 (×6): 6 via TOPICAL

## 2021-04-27 MED ORDER — ACETAMINOPHEN 500 MG PO TABS
1000.0000 mg | ORAL_TABLET | Freq: Once | ORAL | Status: AC
  Administered 2021-04-27: 1000 mg via ORAL
  Filled 2021-04-27: qty 2

## 2021-04-27 MED ORDER — ARTIFICIAL TEARS OPHTHALMIC OINT
TOPICAL_OINTMENT | OPHTHALMIC | Status: AC
  Filled 2021-04-27: qty 7

## 2021-04-27 MED ORDER — ASPIRIN EC 325 MG PO TBEC
325.0000 mg | DELAYED_RELEASE_TABLET | Freq: Every day | ORAL | Status: DC
  Administered 2021-04-28 – 2021-05-03 (×6): 325 mg via ORAL
  Filled 2021-04-27 (×6): qty 1

## 2021-04-27 MED ORDER — METOPROLOL TARTRATE 25 MG/10 ML ORAL SUSPENSION: 12.5000 mg | Freq: Two times a day (BID) | ORAL | Status: DC

## 2021-04-27 MED ORDER — ALBUMIN HUMAN 5 % IV SOLN: INTRAVENOUS | Status: DC | PRN

## 2021-04-27 MED ORDER — BUPIVACAINE LIPOSOME 1.3 % IJ SUSP
INTRAMUSCULAR | Status: AC
  Filled 2021-04-27: qty 20

## 2021-04-27 MED ORDER — DIAZEPAM 2 MG PO TABS
2.0000 mg | ORAL_TABLET | Freq: Three times a day (TID) | ORAL | Status: DC
  Administered 2021-04-27 – 2021-04-28 (×2): 2 mg via ORAL
  Filled 2021-04-27 (×2): qty 1

## 2021-04-27 MED ORDER — STERILE WATER FOR INJECTION IJ SOLN
INTRAMUSCULAR | Status: DC | PRN
  Administered 2021-04-27: 10 mL

## 2021-04-27 MED ORDER — VANCOMYCIN HCL IN DEXTROSE 1-5 GM/200ML-% IV SOLN
1000.0000 mg | Freq: Once | INTRAVENOUS | Status: AC
  Administered 2021-04-27: 1000 mg via INTRAVENOUS
  Filled 2021-04-27: qty 200

## 2021-04-27 MED ORDER — ~~LOC~~ CARDIAC SURGERY, PATIENT & FAMILY EDUCATION
Freq: Once | Status: DC
Start: 2021-04-27 — End: 2021-04-27
  Filled 2021-04-27: qty 1

## 2021-04-27 MED ORDER — SODIUM CHLORIDE 0.9 % IV SOLN: 250.0000 mL | INTRAVENOUS | Status: DC

## 2021-04-27 MED ORDER — PHENYLEPHRINE HCL-NACL 20-0.9 MG/250ML-% IV SOLN
0.0000 ug/min | INTRAVENOUS | Status: DC
  Administered 2021-04-27 – 2021-04-28 (×2): 25 ug/min via INTRAVENOUS
  Filled 2021-04-27 (×2): qty 250

## 2021-04-27 MED ORDER — SODIUM CHLORIDE 0.9% FLUSH
3.0000 mL | Freq: Two times a day (BID) | INTRAVENOUS | Status: DC
  Administered 2021-04-28 – 2021-04-29 (×4): 3 mL via INTRAVENOUS

## 2021-04-27 MED ORDER — HEMOSTATIC AGENTS (NO CHARGE) OPTIME
TOPICAL | Status: DC | PRN
  Administered 2021-04-27 (×2): 1 via TOPICAL

## 2021-04-27 MED ORDER — MAGNESIUM SULFATE 4 GM/100ML IV SOLN
4.0000 g | Freq: Once | INTRAVENOUS | Status: AC
  Administered 2021-04-27: 4 g via INTRAVENOUS
  Filled 2021-04-27: qty 100

## 2021-04-27 MED ORDER — ONDANSETRON HCL 4 MG/2ML IJ SOLN
INTRAMUSCULAR | Status: AC
  Filled 2021-04-27: qty 2

## 2021-04-27 MED ORDER — ROCURONIUM BROMIDE 10 MG/ML (PF) SYRINGE
PREFILLED_SYRINGE | INTRAVENOUS | Status: DC | PRN
  Administered 2021-04-27: 100 mg via INTRAVENOUS
  Administered 2021-04-27: 80 mg via INTRAVENOUS
  Administered 2021-04-27 (×2): 100 mg via INTRAVENOUS

## 2021-04-27 MED ORDER — ACETAMINOPHEN 500 MG PO TABS
1000.0000 mg | ORAL_TABLET | Freq: Four times a day (QID) | ORAL | Status: AC
  Administered 2021-04-27 – 2021-05-02 (×18): 1000 mg via ORAL
  Filled 2021-04-27 (×19): qty 2

## 2021-04-27 MED ORDER — SODIUM CHLORIDE 0.9% FLUSH
10.0000 mL | Freq: Two times a day (BID) | INTRAVENOUS | Status: DC
  Administered 2021-04-27 – 2021-04-29 (×5): 10 mL

## 2021-04-27 MED ORDER — ARTIFICIAL TEARS OPHTHALMIC OINT
TOPICAL_OINTMENT | OPHTHALMIC | Status: DC | PRN
  Administered 2021-04-27: 1 via OPHTHALMIC

## 2021-04-27 MED ORDER — SODIUM CHLORIDE (PF) 0.9 % IJ SOLN
INTRAMUSCULAR | Status: AC
  Filled 2021-04-27: qty 10

## 2021-04-27 MED ORDER — ROCURONIUM BROMIDE 10 MG/ML (PF) SYRINGE
PREFILLED_SYRINGE | INTRAVENOUS | Status: AC
  Filled 2021-04-27: qty 40

## 2021-04-27 MED ORDER — PHENYLEPHRINE 40 MCG/ML (10ML) SYRINGE FOR IV PUSH (FOR BLOOD PRESSURE SUPPORT)
PREFILLED_SYRINGE | INTRAVENOUS | Status: AC
  Filled 2021-04-27: qty 10

## 2021-04-27 MED ORDER — DOCUSATE SODIUM 100 MG PO CAPS
200.0000 mg | ORAL_CAPSULE | Freq: Every day | ORAL | Status: DC
  Administered 2021-04-28 – 2021-05-03 (×6): 200 mg via ORAL
  Filled 2021-04-27 (×6): qty 2

## 2021-04-27 MED ORDER — PLATELET RICH PLASMA OPTIME
Status: DC | PRN
  Administered 2021-04-27: 10 mL

## 2021-04-27 MED ORDER — METOPROLOL TARTRATE 12.5 MG HALF TABLET
12.5000 mg | ORAL_TABLET | Freq: Once | ORAL | Status: DC
Start: 2021-04-27 — End: 2021-04-27

## 2021-04-27 MED ORDER — PROTAMINE SULFATE 10 MG/ML IV SOLN
INTRAVENOUS | Status: DC | PRN
  Administered 2021-04-27: 400 mg via INTRAVENOUS

## 2021-04-27 MED ORDER — ACETAMINOPHEN 160 MG/5ML PO SOLN: 1000.0000 mg | Freq: Four times a day (QID) | ORAL | Status: AC

## 2021-04-27 MED ORDER — KETOROLAC TROMETHAMINE 15 MG/ML IJ SOLN
7.5000 mg | Freq: Four times a day (QID) | INTRAMUSCULAR | Status: AC
  Administered 2021-04-27 – 2021-04-28 (×5): 7.5 mg via INTRAVENOUS
  Filled 2021-04-27 (×5): qty 1

## 2021-04-27 MED ORDER — CEFAZOLIN SODIUM-DEXTROSE 2-4 GM/100ML-% IV SOLN
2.0000 g | Freq: Three times a day (TID) | INTRAVENOUS | Status: AC
  Administered 2021-04-27 – 2021-04-29 (×5): 2 g via INTRAVENOUS
  Filled 2021-04-27 (×6): qty 100

## 2021-04-27 MED ORDER — PLASMA-LYTE A IV SOLN
INTRAVENOUS | Status: DC | PRN
  Administered 2021-04-27: 1000 mL via INTRAVASCULAR

## 2021-04-27 MED ORDER — ATORVASTATIN CALCIUM 80 MG PO TABS
80.0000 mg | ORAL_TABLET | Freq: Every day | ORAL | Status: DC
  Administered 2021-04-27 – 2021-05-02 (×6): 80 mg via ORAL
  Filled 2021-04-27 (×6): qty 1

## 2021-04-27 MED ORDER — MIDAZOLAM HCL (PF) 10 MG/2ML IJ SOLN
INTRAMUSCULAR | Status: AC
  Filled 2021-04-27: qty 2

## 2021-04-27 MED ORDER — PLASMA-LYTE A IV SOLN: INTRAVENOUS | Status: DC

## 2021-04-27 MED ORDER — SODIUM CHLORIDE 0.9 % IV SOLN: INTRAVENOUS | Status: DC | PRN

## 2021-04-27 MED ORDER — SODIUM CHLORIDE 0.45 % IV SOLN: INTRAVENOUS | Status: DC | PRN

## 2021-04-27 MED ORDER — INSULIN REGULAR(HUMAN) IN NACL 100-0.9 UT/100ML-% IV SOLN: INTRAVENOUS | Status: DC

## 2021-04-27 MED ORDER — MORPHINE SULFATE (PF) 2 MG/ML IV SOLN
1.0000 mg | INTRAVENOUS | Status: DC | PRN
  Administered 2021-04-27: 4 mg via INTRAVENOUS
  Administered 2021-04-28 – 2021-04-29 (×5): 2 mg via INTRAVENOUS
  Filled 2021-04-27 (×2): qty 2
  Filled 2021-04-27 (×5): qty 1

## 2021-04-27 MED ORDER — MIDAZOLAM HCL 2 MG/2ML IJ SOLN
2.0000 mg | INTRAMUSCULAR | Status: DC | PRN
  Administered 2021-04-27: 2 mg via INTRAVENOUS
  Filled 2021-04-27: qty 2

## 2021-04-27 MED ORDER — STERILE WATER FOR INJECTION IJ SOLN
INTRAMUSCULAR | Status: AC
  Filled 2021-04-27: qty 10

## 2021-04-27 MED ORDER — MELATONIN 3 MG PO TABS
3.0000 mg | ORAL_TABLET | Freq: Every day | ORAL | Status: DC
  Administered 2021-04-27 – 2021-05-02 (×6): 3 mg via ORAL
  Filled 2021-04-27 (×6): qty 1

## 2021-04-27 MED ORDER — ASPIRIN 81 MG PO CHEW
324.0000 mg | CHEWABLE_TABLET | Freq: Every day | ORAL | Status: DC
  Filled 2021-04-27: qty 4

## 2021-04-27 MED ORDER — DEXMEDETOMIDINE HCL IN NACL 400 MCG/100ML IV SOLN
0.0000 ug/kg/h | INTRAVENOUS | Status: DC
  Administered 2021-04-27: 0.8 ug/kg/h via INTRAVENOUS
  Filled 2021-04-27: qty 100

## 2021-04-27 MED ORDER — CHLORHEXIDINE GLUCONATE 0.12 % MT SOLN
15.0000 mL | Freq: Once | OROMUCOSAL | Status: AC
  Administered 2021-04-27: 15 mL via OROMUCOSAL
  Filled 2021-04-27: qty 15

## 2021-04-27 MED ORDER — TRAMADOL HCL 50 MG PO TABS
50.0000 mg | ORAL_TABLET | ORAL | Status: DC | PRN
  Administered 2021-04-27 – 2021-05-03 (×13): 100 mg via ORAL
  Filled 2021-04-27 (×13): qty 2

## 2021-04-27 MED ORDER — PLATELET POOR PLASMA OPTIME
Status: DC | PRN
  Administered 2021-04-27: 10 mL

## 2021-04-27 MED ORDER — ONDANSETRON HCL 4 MG/2ML IJ SOLN
INTRAMUSCULAR | Status: DC | PRN
  Administered 2021-04-27: 4 mg via INTRAVENOUS

## 2021-04-27 MED ORDER — METOPROLOL TARTRATE 12.5 MG HALF TABLET
12.5000 mg | ORAL_TABLET | Freq: Two times a day (BID) | ORAL | Status: DC
  Administered 2021-04-28 – 2021-04-29 (×2): 12.5 mg via ORAL
  Filled 2021-04-27 (×4): qty 1

## 2021-04-27 MED ORDER — FENTANYL CITRATE (PF) 250 MCG/5ML IJ SOLN
INTRAMUSCULAR | Status: AC
  Filled 2021-04-27: qty 25

## 2021-04-27 MED ORDER — ORAL CARE MOUTH RINSE: 15.0000 mL | Freq: Once | OROMUCOSAL | Status: AC

## 2021-04-27 MED ORDER — ACETAMINOPHEN 160 MG/5ML PO SOLN: 650.0000 mg | Freq: Once | ORAL | Status: AC

## 2021-04-27 MED ORDER — SODIUM CHLORIDE 0.9% FLUSH: 10.0000 mL | INTRAVENOUS | Status: DC | PRN

## 2021-04-27 MED ORDER — NITROGLYCERIN IN D5W 200-5 MCG/ML-% IV SOLN: 7.0000 ug/min | INTRAVENOUS | Status: DC

## 2021-04-27 MED ORDER — CHLORHEXIDINE GLUCONATE 0.12 % MT SOLN
15.0000 mL | OROMUCOSAL | Status: AC
  Administered 2021-04-27: 15 mL via OROMUCOSAL

## 2021-04-27 MED ORDER — LACTATED RINGERS IV SOLN: 500.0000 mL | Freq: Once | INTRAVENOUS | Status: DC | PRN

## 2021-04-27 MED ORDER — OXYCODONE HCL 5 MG PO TABS
5.0000 mg | ORAL_TABLET | ORAL | Status: DC | PRN
  Administered 2021-04-27 – 2021-05-03 (×19): 10 mg via ORAL
  Filled 2021-04-27 (×19): qty 2

## 2021-04-27 MED ORDER — BUPIVACAINE HCL (PF) 0.5 % IJ SOLN
INTRAMUSCULAR | Status: AC
  Filled 2021-04-27: qty 30

## 2021-04-27 MED ORDER — ISOSORBIDE DINITRATE 10 MG PO TABS
10.0000 mg | ORAL_TABLET | Freq: Three times a day (TID) | ORAL | Status: DC
  Administered 2021-04-28 – 2021-04-29 (×4): 10 mg via ORAL
  Filled 2021-04-27 (×4): qty 1

## 2021-04-27 MED ORDER — FAMOTIDINE IN NACL 20-0.9 MG/50ML-% IV SOLN
20.0000 mg | Freq: Two times a day (BID) | INTRAVENOUS | Status: AC
  Administered 2021-04-27 (×2): 20 mg via INTRAVENOUS
  Filled 2021-04-27 (×3): qty 50

## 2021-04-27 MED ORDER — IPRATROPIUM-ALBUTEROL 0.5-2.5 (3) MG/3ML IN SOLN: 3.0000 mL | Freq: Four times a day (QID) | RESPIRATORY_TRACT | Status: DC | PRN

## 2021-04-27 MED ORDER — VANCOMYCIN HCL 1000 MG IV SOLR
INTRAVENOUS | Status: AC
  Filled 2021-04-27: qty 3000

## 2021-04-27 SURGICAL SUPPLY — 128 items
ADAPTER CARDIO PERF ANTE/RETRO (ADAPTER) ×4 IMPLANT
ADH SKN CLS APL DERMABOND .7 (GAUZE/BANDAGES/DRESSINGS) ×3
ADPR PRFSN 84XANTGRD RTRGD (ADAPTER) ×3
APL SRG 7X2 LUM MLBL SLNT (VASCULAR PRODUCTS) ×3
APPLICATOR TIP COSEAL (VASCULAR PRODUCTS) ×4 IMPLANT
APPLIER CLIP 9.375 SM OPEN (CLIP) ×4
APR CLP SM 9.3 20 MLT OPN (CLIP) ×3
BAG DECANTER FOR FLEXI CONT (MISCELLANEOUS) ×4 IMPLANT
BLADE CLIPPER SURG (BLADE) ×4 IMPLANT
BLADE STERNUM SYSTEM 6 (BLADE) ×4 IMPLANT
BLADE SURG 15 STRL LF DISP TIS (BLADE) ×3 IMPLANT
BLADE SURG 15 STRL SS (BLADE) ×4
BNDG ELASTIC 4X5.8 VLCR STR LF (GAUZE/BANDAGES/DRESSINGS) ×4 IMPLANT
BNDG ELASTIC 6X5.8 VLCR STR LF (GAUZE/BANDAGES/DRESSINGS) ×4 IMPLANT
BNDG GAUZE ELAST 4 BULKY (GAUZE/BANDAGES/DRESSINGS) ×4 IMPLANT
CABLE PACING FASLOC BLUE (MISCELLANEOUS) ×4 IMPLANT
CANISTER SUCT 3000ML PPV (MISCELLANEOUS) ×4 IMPLANT
CANISTER WOUNDNEG PRESSURE 500 (CANNISTER) ×4 IMPLANT
CANNULA NON VENT 22FR 12 (CANNULA) ×4 IMPLANT
CATH CPB KIT HENDRICKSON (MISCELLANEOUS) ×4 IMPLANT
CATH FOLEY 16FR TEMP PROBE (CATHETERS) IMPLANT
CATH FOLEY LATEX FREE 14FR (CATHETERS) ×4
CATH FOLEY LF 14FR (CATHETERS) ×3 IMPLANT
CATH ROBINSON RED A/P 18FR (CATHETERS) ×8 IMPLANT
CLIP APPLIE 9.375 SM OPEN (CLIP) ×3 IMPLANT
CLIP RETRACTION 3.0MM CORONARY (MISCELLANEOUS) ×4 IMPLANT
CLIP VESOCCLUDE MED 24/CT (CLIP) ×4 IMPLANT
CLIP VESOCCLUDE SM WIDE 24/CT (CLIP) ×12 IMPLANT
CONN ST 1/4X3/8  BEN (MISCELLANEOUS) ×8
CONN ST 1/4X3/8 BEN (MISCELLANEOUS) ×6 IMPLANT
CONTAINER PROTECT SURGISLUSH (MISCELLANEOUS) ×4 IMPLANT
COVER MAYO STAND STRL (DRAPES) ×4 IMPLANT
CUFF TOURN SGL QUICK 18X4 (TOURNIQUET CUFF) IMPLANT
CUFF TOURN SGL QUICK 24 (TOURNIQUET CUFF)
CUFF TRNQT CYL 24X4X16.5-23 (TOURNIQUET CUFF) IMPLANT
DERMABOND ADVANCED (GAUZE/BANDAGES/DRESSINGS) ×1
DERMABOND ADVANCED .7 DNX12 (GAUZE/BANDAGES/DRESSINGS) ×3 IMPLANT
DRAIN CHANNEL 28F RND 3/8 FF (WOUND CARE) ×16 IMPLANT
DRAPE CARDIOVASCULAR INCISE (DRAPES) ×4
DRAPE EXTREMITY T 121X128X90 (DISPOSABLE) ×8 IMPLANT
DRAPE HALF SHEET 40X57 (DRAPES) ×4 IMPLANT
DRAPE SRG 135X102X78XABS (DRAPES) ×3 IMPLANT
DRAPE WARM FLUID 44X44 (DRAPES) ×4 IMPLANT
DRESSING PEEL AND PLAC PRVNA20 (GAUZE/BANDAGES/DRESSINGS) ×3 IMPLANT
DRESSING PREVENA PLUS CUSTOM (GAUZE/BANDAGES/DRESSINGS) ×3 IMPLANT
DRSG AQUACEL AG ADV 3.5X14 (GAUZE/BANDAGES/DRESSINGS) ×4 IMPLANT
DRSG PEEL AND PLACE PREVENA 20 (GAUZE/BANDAGES/DRESSINGS) ×4
DRSG PREVENA PLUS CUSTOM (GAUZE/BANDAGES/DRESSINGS) ×4
ELECT CAUTERY BLADE 6.4 (BLADE) ×4 IMPLANT
ELECT REM PT RETURN 9FT ADLT (ELECTROSURGICAL) ×8
ELECTRODE REM PT RTRN 9FT ADLT (ELECTROSURGICAL) ×6 IMPLANT
FELT TEFLON 1X6 (MISCELLANEOUS) ×8 IMPLANT
GAUZE SPONGE 4X4 12PLY STRL (GAUZE/BANDAGES/DRESSINGS) ×8 IMPLANT
GAUZE SPONGE 4X4 12PLY STRL LF (GAUZE/BANDAGES/DRESSINGS) ×8 IMPLANT
GEL ULTRASOUND 20GR AQUASONIC (MISCELLANEOUS) ×4 IMPLANT
GLOVE NEODERM STRL 7.5  LF PF (GLOVE) ×9
GLOVE NEODERM STRL 7.5 LF PF (GLOVE) ×9 IMPLANT
GLOVE SRG 8 PF TXTR STRL LF DI (GLOVE) ×3 IMPLANT
GLOVE SURG MICRO LTX SZ6.5 (GLOVE) ×16 IMPLANT
GLOVE SURG NEODERM 7.5  LF PF (GLOVE) ×3
GLOVE SURG UNDER POLY LF SZ6 (GLOVE) ×8 IMPLANT
GLOVE SURG UNDER POLY LF SZ8 (GLOVE) ×4
GOWN STRL REUS W/ TWL LRG LVL3 (GOWN DISPOSABLE) ×30 IMPLANT
GOWN STRL REUS W/TWL LRG LVL3 (GOWN DISPOSABLE) ×40
HEMOSTAT POWDER SURGIFOAM 1G (HEMOSTASIS) ×8 IMPLANT
INSERT FOGARTY XLG (MISCELLANEOUS) ×4 IMPLANT
INSERT SUTURE HOLDER (MISCELLANEOUS) ×4 IMPLANT
KIT APPLICATOR RATIO 11:1 (KITS) ×4 IMPLANT
KIT BASIN OR (CUSTOM PROCEDURE TRAY) ×4 IMPLANT
KIT SUCTION CATH 14FR (SUCTIONS) ×4 IMPLANT
KIT TURNOVER KIT B (KITS) ×4 IMPLANT
KIT VASOVIEW HEMOPRO 2 VH 4000 (KITS) ×4 IMPLANT
MARKER GRAFT CORONARY BYPASS (MISCELLANEOUS) ×12 IMPLANT
NEEDLE 18GX1X1/2 (RX/OR ONLY) (NEEDLE) ×4 IMPLANT
NS IRRIG 1000ML POUR BTL (IV SOLUTION) ×20 IMPLANT
PACK E OPEN HEART (SUTURE) ×4 IMPLANT
PACK OPEN HEART (CUSTOM PROCEDURE TRAY) ×4 IMPLANT
PACK PLATELET PROCEDURE 60 (MISCELLANEOUS) ×4 IMPLANT
PACK SPY-PHI (KITS) ×4 IMPLANT
PAD ARMBOARD 7.5X6 YLW CONV (MISCELLANEOUS) ×8 IMPLANT
PAD ELECT DEFIB RADIOL ZOLL (MISCELLANEOUS) ×4 IMPLANT
PENCIL BUTTON HOLSTER BLD 10FT (ELECTRODE) ×4 IMPLANT
POSITIONER HEAD DONUT 9IN (MISCELLANEOUS) ×4 IMPLANT
POWDER SURGICEL 3.0 GRAM (HEMOSTASIS) ×4 IMPLANT
PUNCH AORTIC ROTATE 4.5MM 8IN (MISCELLANEOUS) ×4 IMPLANT
SEALANT SURG COSEAL 4ML (VASCULAR PRODUCTS) ×4 IMPLANT
SEALANT SURG COSEAL 8ML (VASCULAR PRODUCTS) ×4 IMPLANT
SET MPS 3-ND DEL (MISCELLANEOUS) ×4 IMPLANT
SHEARS HARMONIC 9CM CVD (BLADE) ×4 IMPLANT
SUT BONE WAX W31G (SUTURE) ×4 IMPLANT
SUT MNCRL AB 3-0 PS2 18 (SUTURE) ×8 IMPLANT
SUT MNCRL AB 4-0 PS2 18 (SUTURE) ×4 IMPLANT
SUT PDS AB 1 CTX 36 (SUTURE) ×8 IMPLANT
SUT PROLENE 3 0 SH DA (SUTURE) ×8 IMPLANT
SUT PROLENE 4 0 SH DA (SUTURE) ×4 IMPLANT
SUT PROLENE 5 0 C 1 36 (SUTURE) IMPLANT
SUT PROLENE 6 0 C 1 30 (SUTURE) ×12 IMPLANT
SUT PROLENE 7 0 BV1 MDA (SUTURE) ×4 IMPLANT
SUT PROLENE 8 0 BV175 6 (SUTURE) IMPLANT
SUT PROLENE BLUE 7 0 (SUTURE) ×4 IMPLANT
SUT SILK  1 MH (SUTURE) ×8
SUT SILK 1 MH (SUTURE) ×6 IMPLANT
SUT SILK 2 0 SH CR/8 (SUTURE) IMPLANT
SUT SILK 3 0 SH CR/8 (SUTURE) IMPLANT
SUT STEEL 6MS V (SUTURE) IMPLANT
SUT STEEL STERNAL CCS#1 18IN (SUTURE) ×16 IMPLANT
SUT STEEL SZ 6 DBL 3X14 BALL (SUTURE) ×4 IMPLANT
SUT VIC AB 2-0 CT1 27 (SUTURE) ×4
SUT VIC AB 2-0 CT1 TAPERPNT 27 (SUTURE) ×3 IMPLANT
SUT VIC AB 2-0 CTX 27 (SUTURE) IMPLANT
SUT VIC AB 3-0 SH 27 (SUTURE)
SUT VIC AB 3-0 SH 27X BRD (SUTURE) IMPLANT
SUT VIC AB 3-0 X1 27 (SUTURE) IMPLANT
SYR 10ML LL (SYRINGE) IMPLANT
SYR 30ML LL (SYRINGE) ×4 IMPLANT
SYR 3ML LL SCALE MARK (SYRINGE) ×4 IMPLANT
SYR 50ML SLIP (SYRINGE) IMPLANT
SYSTEM SAHARA CHEST DRAIN ATS (WOUND CARE) ×8 IMPLANT
TAPE CLOTH SURG 4X10 WHT LF (GAUZE/BANDAGES/DRESSINGS) ×4 IMPLANT
TAPE PAPER 2X10 WHT MICROPORE (GAUZE/BANDAGES/DRESSINGS) ×4 IMPLANT
TIP DUAL SPRAY TOPICAL (TIP) ×8 IMPLANT
TOWEL GREEN STERILE (TOWEL DISPOSABLE) ×4 IMPLANT
TOWEL GREEN STERILE FF (TOWEL DISPOSABLE) ×4 IMPLANT
TRAY FOLEY SLVR 16FR TEMP STAT (SET/KITS/TRAYS/PACK) IMPLANT
TUBING LAP HI FLOW INSUFFLATIO (TUBING) IMPLANT
UNDERPAD 30X36 HEAVY ABSORB (UNDERPADS AND DIAPERS) ×4 IMPLANT
WATER STERILE IRR 1000ML POUR (IV SOLUTION) ×8 IMPLANT
WATER STERILE IRR 1000ML UROMA (IV SOLUTION) IMPLANT

## 2021-04-27 NOTE — Brief Op Note (Addendum)
04/27/2021  12:20 PM  PATIENT:  Jacob Downs  60 y.o. male  PRE-OPERATIVE DIAGNOSIS:  Coronary artery disease  POST-OPERATIVE DIAGNOSIS:  Coronary artery disease  PROCEDURE: TRANSESOPHAGEAL ECHOCARDIOGRAM (TEE), CORONARY ARTERY BYPASS GRAFTING (CABG) X FOUR (LIMA to LAD, RIGHT RADIAL ARTERY SEQUENTIALLY to DIAGONAL 2 and OM, RIMA to PDA)  USING LEFT INTERNAL MAMMARY ARTERY, RIGHT INTERNAL MAMMARY ARTERY, RIGHT RADIAL ARTERY CONDUITS, and INDOCYANINE GREEN FLUORESCENCE IMAGING (ICG)  RIGHT RADIAL ARTERY HARVEST: 39 minutes; RIGHT RADIAL ARTERY PREP TIME: 5 minutes  SURGEON:  Surgeon(s) and Role:    Linden Dolin, MD - Primary  PHYSICIAN ASSISTANT: Doree Fudge PA-C  ASSISTANTS: Tanda Rockers RNFA  ANESTHESIA:   general  EBL: Per anesthesia and perfusion record  DRAINS: Chest tubes placed in the mediastinal and pleural spaces   LOCAL MEDICATIONS USED:  OTHER Exparel  COUNTS CORRECT:  YES  DICTATION: .Dragon Dictation  PLAN OF CARE: Admit to inpatient   PATIENT DISPOSITION:  ICU - intubated and hemodynamically stable.   Delay start of Pharmacological VTE agent (>24hrs) due to surgical blood loss or risk of bleeding: yes  BASELINE WEIGHT: 112.5 kg Agree with documentation Jacob Downs Z. Vickey Sages, MD [phone]

## 2021-04-27 NOTE — H&P (Signed)
History and Physical Interval Note:  04/27/2021 7:22 AM  Jacob Downs  has presented today for surgery, with the diagnosis of CAD.  The various methods of treatment have been discussed with the patient and family. After consideration of risks, benefits and other options for treatment, the patient has consented to  Procedure(s) with comments: CORONARY ARTERY BYPASS GRAFTING (CABG) (N/A) - POSSIBLE BIMA possible RADIAL ARTERY HARVEST (Left) TRANSESOPHAGEAL ECHOCARDIOGRAM (TEE) (N/A) INDOCYANINE GREEN FLUORESCENCE IMAGING (ICG) (N/A) as a surgical intervention.  The patient's history has been reviewed, patient examined, no change in status, stable for surgery.  I have reviewed the patient's chart and labs.  Questions were answered to the patient's satisfaction.     Jacob Downs        [address]       Jacob Downs [id number]             [phone]     CARDIOTHORACIC SURGERY CONSULTATION REPORT  Referring Provider is No ref. provider found Primary Cardiologist is Olga Millers, MD PCP is Catha Gosselin, MD  No chief complaint on file.   HPI:  60 year old man with 5-year history of coronary artery disease status post left heart catheterization in 2017 which demonstrated single-vessel disease of the LAD is now referred for consideration of CABG status post left heart catheterization last week which showed three-vessel disease.  This was done to assess exertional angina which had worsened in the past month or so.  His pain particularly arose with heavy exertion and responded to sublingual nitroglycerin.  The patient also notes cough which is sometimes productive of clear sputum but without fever.  This has been going on for several weeks.  He has tested negative for flu and COVID.  He has not started any new medications which might explain the new cough.  His cardiac risk factors are otherwise notable for diabetes, hyperlipidemia, and hypertension. Past Medical  History:  Diagnosis Date  . Anginal pain (HCC)   . Arthritis   . Cellulitis of left hand 11/27/2015  . Coronary artery disease   . Diabetes mellitus without complication (HCC)   . GERD (gastroesophageal reflux disease)   . Hyperlipemia   . Hypertension   . Myocardial infarction (HCC)   . Shingles    4-5 years ago  . 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 CARDIAC CATHETERIZATION N/A 11/30/2015   Procedure: Left Heart Cath and Coronary Angiography;  Surgeon: Kathleene Hazel, MD;  Location: [facility] INVASIVE CV LAB;  Service: Cardiovascular;  Laterality: N/A;  . COLONOSCOPY    . I & D EXTREMITY Right 11/26/2014   Procedure: INCISION AND DRAINAGE OF RIGHT INDEX FINGER;  Surgeon: Dairl Ponder, MD;  Location: [facility];  Service: Orthopedics;  Laterality: Right;  . INTRAVASCULAR PRESSURE WIRE/FFR STUDY N/A 04/15/2021   Procedure: INTRAVASCULAR PRESSURE WIRE/FFR STUDY;  Surgeon: Corky Crafts, MD;  Location: [facility] INVASIVE CV LAB;  Service: Cardiovascular;  Laterality: N/A;  . LEFT HEART CATH AND CORONARY ANGIOGRAPHY N/A 04/15/2021   Procedure: LEFT HEART CATH AND CORONARY ANGIOGRAPHY;  Surgeon: Corky Crafts, MD;  Location: [facility] INVASIVE CV LAB;  Service: Cardiovascular;  Laterality: N/A;  . ORIF ANKLE FRACTURE  10/03/2012   Procedure: OPEN REDUCTION INTERNAL FIXATION (ORIF) ANKLE FRACTURE;  Surgeon: Nestor Lewandowsky, MD;  Location: [facility];  Service: Orthopedics;  Laterality: Left;  . PLANTAR FASCIA SURGERY  06,07   both  feet  . TONSILLECTOMY     as a child    Family History  Problem Relation Age of Onset  . Colon cancer Neg Hx   . CAD Other        No family history    Social History   Socioeconomic History  . Marital status: Married    Spouse name: Not on file  . Number of children: 2  . Years of education: Not on file  . Highest education level: Not on file  Occupational History  . Occupation: Works 6p-6a   Tobacco Use  . Smoking status: Never Smoker  . Smokeless tobacco: Never Used  Vaping Use  . Vaping Use: Never used  Substance and Sexual Activity  . Alcohol use: Yes    Alcohol/week: 0.0 standard drinks    Comment: 6 to 12 beers per week  . Drug use: No  . Sexual activity: Not on file  Other Topics Concern  . Not on file  Social History Narrative  . Not on file   Social Determinants of Health   Financial Resource Strain: Not on file  Food Insecurity: Not on file  Transportation Needs: Not on file  Physical Activity: Not on file  Stress: Not on file  Social Connections: Not on file  Intimate Partner Violence: Not on file    Current Facility-Administered Medications  Medication Dose Route Frequency Provider Last Rate Last Admin  . 0.9 % irrigation (POUR BTL)    PRN Linden Dolin, MD   5,000 mL at 04/27/21 0659  . ceFAZolin (ANCEF) IVPB 2g/100 mL premix  2 g Intravenous To OR Clearnce Leja, Merri Brunette, MD      . ceFAZolin (ANCEF) IVPB 2g/100 mL premix  2 g Intravenous To OR Enna Warwick, Merri Brunette, MD      . chlorhexidine (HIBICLENS) 4 % liquid 2 application  30 mL Topical UD Linden Dolin, MD      . Melene Muller ON 04/28/2021] chlorhexidine (PERIDEX) 0.12 % solution 15 mL  15 mL Mouth/Throat Once Linden Dolin, MD      . [facility] Cardiac Surgery, Patient & Family Education   Does not apply Once Linden Dolin, MD      . dexmedetomidine (PRECEDEX) 400 MCG/100ML (4 mcg/mL) infusion  0.1-0.7 mcg/kg/hr Intravenous To OR Heron Pitcock, Merri Brunette, MD      . EPINEPHrine (ADRENALIN) 4 mg in NS 250 mL (0.016 mg/mL) premix infusion  0-10 mcg/min Intravenous To OR Marirose Deveney Z, MD      . heparin 30,000 units/NS 1000 mL solution for CELLSAVER   Other To OR Aidric Endicott, Merri Brunette, MD      . heparin sodium (porcine) 5,000 Units, papaverine 60 mg in electrolyte-A (PLASMALYTE-A PH 7.4) 1,000 mL irrigation   Irrigation To OR Heiley Shaikh Z, MD      . insulin regular, human (MYXREDLIN) 100 units/ 100 mL  infusion   Intravenous To OR Tayli Buch, Merri Brunette, MD      . lactated ringers infusion   Intravenous Continuous Gaynelle Adu, MD      . magnesium sulfate (IV Push/IM) injection 40 mEq  40 mEq Other To OR Rodricus Candelaria, Merri Brunette, MD      . metoprolol tartrate (LOPRESSOR) tablet 12.5 mg  12.5 mg Oral Once Jaslynn Thome, Merri Brunette, MD      . milrinone (PRIMACOR) 20 MG/100 ML (0.2 mg/mL) infusion  0.3 mcg/kg/min Intravenous To OR Draiden Mirsky Z, MD      . nitroGLYCERIN 50 mg in  dextrose 5 % 250 mL (0.2 mg/mL) infusion  2-200 mcg/min Intravenous To OR Maniyah Moller, Merri Brunette, MD      . norepinephrine (LEVOPHED) 4mg  in premix infusion  0-40 mcg/min Intravenous To OR Catheleen Langhorne Z, MD      . phenylephrine (NEOSYNEPHRINE) 20-0.9 MG/250ML-% infusion  30-200 mcg/min Intravenous To OR Terron Merfeld Z, MD      . potassium chloride injection 80 mEq  80 mEq Other To OR Jonavin Seder, , MD      . tranexamic acid (CYKLOKAPRON) 2,500 mg in sodium chloride 0.9 % 250 mL (10 mg/mL) infusion  1.5 mg/kg/hr Intravenous To OR Issaiah Seabrooks, Merri Brunette, MD      . tranexamic acid (CYKLOKAPRON) bolus via infusion - over 30 minutes 1,681.5 mg  15 mg/kg Intravenous To OR Avin Gibbons Z, MD      . tranexamic acid (CYKLOKAPRON) pump prime solution 224 mg  2 mg/kg Intracatheter To OR Tanga Gloor Z, MD      . vancomycin (VANCOREADY) IVPB 1500 mg/300 mL  1,500 mg Intravenous To OR Kiya Eno, Merri Brunette, MD       Facility-Administered Medications Ordered in Other Encounters  Medication Dose Route Frequency Provider Last Rate Last Admin  . fentaNYL citrate (PF) (SUBLIMAZE) injection   Intravenous Anesthesia Intra-op Merri Brunette, CRNA   50 mcg at 04/27/21 [phone]  . lactated ringers infusion   Intravenous Continuous PRN 2637, CRNA   New Bag at 04/27/21 [phone]  . lactated ringers infusion   Intravenous Continuous PRN 8588, CRNA   New Bag at 04/27/21 0710  . lactated ringers infusion   Intravenous Continuous PRN 06/27/21, CRNA   New Bag at 04/27/21 0710  . midazolam PF (VERSED) injection   Intravenous Anesthesia Intra-op 06/27/21, CRNA   2 mg at 04/27/21 06/27/21    Allergies  Allergen Reactions  . Glimepiride Other (See Comments)    hypoglycemia      Review of Systems:   General:  Reduced energy level no fevers  Cardiac:  As per HPI; no palpitations or atrial fibrillation  Respiratory:  As per HPI with chronic cough  GI:   History of reflux disease no abdominal pain  GU:   History of left flank pain   Vascular:  No claudication  Neuro:   Denies strokes and TIAs  Musculoskeletal: Negative  Skin:   Negative  Psych:   Negative  Eyes:   Negative  ENT:   Negative  Hematologic:  Not on blood thinners  Endocrine:   diabetes, does not check CBG's at home reliably     Physical Exam:   BP (!) 145/80   Pulse 75   Temp 97.7 F (36.5 C) (Oral)   Resp 18   Ht 6' (1.829 m)   Wt 112.5 kg   SpO2 95%   BMI 33.63 kg/m   General:   well-appearing  HEENT:  Unremarkable   Neck:   no JVD, no bruits, no adenopathy   Chest:   clear to auscultation, symmetrical breath sounds, no wheezes, no rhonchi   CV:   RRR, no detectable murmur   Abdomen:  soft, non-tender, no masses   Extremities:  warm, well-perfused, pulses intact throughout, no LE edema  Rectal/GU  Deferred  Neuro:   Grossly non-focal and symmetrical throughout  Skin:   Clean and dry, no rashes, no breakdown   Diagnostic Tests:  I have personally reviewed his available imaging studies including his most recent left heart catheterization  from 04/15/2021 which demonstrates severe multivessel coronary artery disease.  I agree with the interpretation.   Impression:  Pleasant 60 year old man with longstanding coronary disease now with evidence for three-vessel disease.  As a diabetic, he is best served with CABG.  He is a good candidate for the procedure pending the completion of his routine work-up   Plan:  CABG with possible left  radial artery harvesting and possible bilateral internal mammary artery harvesting on 04/27/2021  Complete the work-up in the meantime; this should include transthoracic echocardiogram vascular ultrasonography to exclude extracranial cerebrovascular disease and to evaluate the palmar arch anatomy particular the left hand, and CT scan of the chest to evaluate chronic cough  Prescription given for inhaler to address his cough symptoms  Would also need hemoglobin A1c as part of the blood work before surgery to assess his diabetic status  I spent in excess of 45 minutes during the conduct of this office consultation and >50% of this time involved direct face-to-face encounter with the patient for counseling and/or coordination of their care.          Level 3 Office Consult = 40 minutes         Level 4 Office Consult = 60 minutes         Level 5 Office Consult = 80 minutes  B.  Lorayne MarekZane Tanina Barb, MD 04/27/2021 7:23 AM

## 2021-04-27 NOTE — Anesthesia Postprocedure Evaluation (Signed)
Anesthesia Post Note  Patient: Jacob Downs  Procedure(s) Performed: CORONARY ARTERY BYPASS GRAFTING (CABG) X FOUR,  USING LEFT INTERNAL MAMMARY ARTERY, RIGHT INTERNAL MAMMARY ARTERY, RIGHT RADIAL ARTERY CONDUITS (N/A Chest) possible RADIAL ARTERY HARVEST (Right Arm Lower) TRANSESOPHAGEAL ECHOCARDIOGRAM (TEE) (N/A ) INDOCYANINE GREEN FLUORESCENCE IMAGING (ICG) (N/A )     Patient location during evaluation: SICU Anesthesia Type: General Level of consciousness: sedated Pain management: pain level controlled Vital Signs Assessment: post-procedure vital signs reviewed and stable Respiratory status: patient remains intubated per anesthesia plan Cardiovascular status: stable Postop Assessment: no apparent nausea or vomiting Anesthetic complications: no   No complications documented.  Last Vitals:  Vitals:   04/27/21 1515 04/27/21 1530  BP:    Pulse: 92 92  Resp: 12 14  Temp: (!) 35.9 C (!) 36 C  SpO2: 100% 100%    Last Pain:  Vitals:   04/27/21 1500  TempSrc: Core  PainSc:                  Tavonte Seybold,W. EDMOND

## 2021-04-27 NOTE — Anesthesia Procedure Notes (Signed)
Procedure Name: Intubation Date/Time: 04/27/2021 7:49 AM Performed by: Elliot Dally, CRNA Pre-anesthesia Checklist: Patient identified, Emergency Drugs available, Suction available and Patient being monitored Patient Re-evaluated:Patient Re-evaluated prior to induction Oxygen Delivery Method: Circle System Utilized Preoxygenation: Pre-oxygenation with 100% oxygen Induction Type: IV induction Ventilation: Mask ventilation without difficulty and Oral airway inserted - appropriate to patient size Laryngoscope Size: Hyacinth Meeker and 2 Grade View: Grade I Tube type: Oral Tube size: 8.0 mm Number of attempts: 1 Airway Equipment and Method: Stylet and Oral airway Placement Confirmation: ETT inserted through vocal cords under direct vision,  positive ETCO2 and breath sounds checked- equal and bilateral Secured at: 22 cm Tube secured with: Tape Dental Injury: Teeth and Oropharynx as per pre-operative assessment

## 2021-04-27 NOTE — Op Note (Signed)
CARDIOTHORACIC SURGERY OPERATIVE NOTE  Date of Procedure: 04/27/2021  Preoperative Diagnosis: Severe 3-vessel Coronary Artery Disease  Postoperative Diagnosis: Same  Procedure:    Coronary Artery Bypass Grafting x 4  Left Internal Mammary Artery to Distal Left Anterior Descending Coronary Artery; pedicled right internal mammary artery to posterior Descending Coronary Artery; right radial artery graft to second obtuse Marginal Branch of Left Circumflex Coronary Artery and second diagonal Branch Coronary Artery as a sequenced graft  Open right radial artery harvesting Bilateral internal mammary artery harvesting Completion graft surveillance with indocyanine green fluorescence imaging  Surgeon: B.  Lorayne Marek, MD  Assistant: Jacques Earthly, PA-C  Anesthesia: General  Operative Findings:  Preserved left ventricular systolic function  Good quality internal mammary artery conduits  Good quality right radial artery conduit  Good quality target vessels for grafting    BRIEF CLINICAL NOTE AND INDICATIONS FOR SURGERY  60 year old man with known mild coronary artery disease developed worsening chest pain in the past month.  This mostly occurred with heavy exertion and was relieved by sublingual nitroglycerin.  He presented with the symptoms to his local cardiologist and underwent left heart catheterization due to his known history of coronary disease.  This demonstrated multivessel disease.  He was referred for CABG.  He has been thoroughly evaluated and is considered a good candidate and low risk for the procedure   DETAILS OF THE OPERATIVE PROCEDURE  Preparation:  The patient is brought to the operating room on the above mentioned date and central monitoring was established by the anesthesia team including placement of Swan-Ganz catheter and radial arterial line. The patient is placed in the supine position on the operating table.  Intravenous antibiotics are administered. General  endotracheal anesthesia is induced uneventfully. A Foley catheter is placed.  Baseline transesophageal echocardiogram was performed.  Findings were notable for preserved LV function and no significant valvular disease  The patient's chest, abdomen, both groins, the right upper extremity, and both lower extremities are prepared and draped in a sterile manner. A time out procedure is performed.   Surgical Approach and Conduit Harvest:  Attention was first turned to the right forearm where an incision was made at the wrist overlying a palpable pulse.  Open radial artery harvesting is performed, having performed a modified Allen's test with plethysmography demonstrating the safety of harvesting the right radial artery in regards to palmar flow.  Once the artery is removed it is treated with papaverine.  The wound is then closed in layers.  The right arm is tucked at the side.    A median sternotomy incision was performed and the left internal mammary artery is dissected from the chest wall and prepared for bypass grafting. The left internal mammary artery is notably good quality conduit.  Next attention was turned to the right hemithorax where the right internal mammary artery is pedicle mobilized in a similar fashion.  Following systemic heparinization, the internal mammary artery conduits were transected distally noted to have excellent flow.  Both were treated with a solution of papaverine   Extracorporeal Cardiopulmonary Bypass and Myocardial Protection:  The pericardium is opened. The ascending aorta is nondiseased in appearance. The ascending aorta and the right atrium are cannulated for cardiopulmonary bypass.  Adequate heparinization is verified.    The entire pre-bypass portion of the operation was notable for stable hemodynamics.  Cardiopulmonary bypass was begun and the surface of the heart is inspected. Distal target vessels are selected for coronary artery bypass grafting. A cardioplegia  cannula is placed in the ascending aorta.    The patient is cooled to 34C systemic temperature.  The aortic cross clamp is applied and cold blood cardioplegia is delivered initially in an antegrade fashion through the aortic root. Iced saline slush is applied for topical hypothermia.  The initial cardioplegic arrest is rapid with early diastolic arrest.  Repeat doses of cardioplegia are administered intermittently throughout the entire cross clamp portion of the operation through the aortic root and through subsequently placed  grafts in order to maintain completely flat electrocardiogram.   Coronary Artery Bypass Grafting:   The  posterior descending branch of the right coronary artery was grafted using the pedicled right internal mammary artery graft in an end-to-side fashion.  At the site of distal anastomosis the target vessel was good quality and measured approximately 1.5 mm in diameter. Anastomotic patency and runoff was confirmed with indocyanine green fluorescence imaging (SPY).  The second obtuse marginal branch of the left circumflex coronary artery and the second diagonal branch of the left anterior descending coronary artery were grafted using the right radial artery graft in a sequenced fashion.  At the site of distal anastomosis the target vessel were good quality and measured approximately 1.5 mm in diameter.    The distal left anterior coronary artery was grafted with the left internal mammary artery in an end-to-side fashion.  At the site of distal anastomosis the target vessel was good quality and measured approximately 1.5 mm in diameter. Anastomotic patency and runoff was confirmed with indocyanine green fluorescence imaging (SPY).  The proximal radial artery anastomosis was placed directly to the ascending aorta prior to removal of the aortic cross clamp.  A reanimation dose of cardioplegia was given down the aortic root.  De-airing procedures were performed and the aortic  cross-clamp was removed.   Procedure Completion:  All proximal and distal coronary anastomoses were inspected for hemostasis and appropriate graft orientation. Epicardial pacing wires are fixed to the right ventricular outflow tract and to the right atrial appendage. The patient is rewarmed to 37C temperature. The patient is weaned and disconnected from cardiopulmonary bypass.  The patient's rhythm at separation from bypass was sinus bradycardia.  The patient was weaned from cardiopulmonary bypass  without any inotropic support.   Followup transesophageal echocardiogram performed after separation from bypass revealed  no changes from the preoperative exam.  The aortic and venous cannula were removed uneventfully. Protamine was administered to reverse the anticoagulation. The mediastinum and pleural space were inspected for hemostasis and irrigated with saline solution. The mediastinum and both pleural spaces were drained using fluted chest tubes placed through separate stab incisions inferiorly.  The soft tissues anterior to the aorta were reapproximated loosely. The sternum is closed with double strength sternal wire. The soft tissues anterior to the sternum were closed in multiple layers and the skin is closed with a running subcuticular skin closure.  The post-bypass portion of the operation was notable for stable rhythm and hemodynamics.  No blood products were administered during the operation.   Disposition:  The patient tolerated the procedure well and is transported to the surgical intensive care in stable condition. There are no intraoperative complications. All sponge instrument and needle counts are verified correct at completion of the operation.    Brantley Fling, MD 04/27/2021 6:39 PM

## 2021-04-27 NOTE — Procedures (Signed)
Extubation Procedure Note  Patient Details:   Name: AHMAD VANWEY DOB: [birth year] MRN: [id number]   Airway Documentation:    Vent end date: 04/27/21 Vent end time: 1732   Evaluation  O2 sats: stable throughout Complications: No apparent complications Patient did tolerate procedure well. Bilateral Breath Sounds: Clear,Diminished   Yes, pt could speak post extubation.  Pt extubated to 4 l/m [place] per physician's order.  Audrie Lia 04/27/2021, 5:33 PM

## 2021-04-27 NOTE — Discharge Instructions (Signed)

## 2021-04-27 NOTE — Progress Notes (Signed)
Echocardiogram Echocardiogram Transesophageal has been performed.  Jacob Downs M 04/27/2021, 8:18 AM

## 2021-04-27 NOTE — Hospital Course (Addendum)
HPI: This is a 60 year old man with 5-year history of coronary artery disease status post left heart catheterization in 2017 which demonstrated single-vessel disease of the LAD is now referred for consideration of CABG status post left heart catheterization last week which showed three-vessel disease.  This was done to assess exertional angina which had worsened in the past month or so.  His pain particularly arose with heavy exertion and responded to sublingual nitroglycerin.  The patient also notes cough which is sometimes productive of clear sputum but without fever.  This has been going on for several weeks.  He has tested negative for flu and COVID.  He has not started any new medications which might explain the new cough.   His cardiac risk factors are otherwise notable for diabetes, hyperlipidemia, and hypertension. Dr. Vickey Sages discussed the need for coronary artery bypass grafting surgery. Potential risks, benefits, and complications of the surgery were discussed with the patient and he agreed to proceed with surgery. Pre operative carotid duplex US showed no significant internal carotid artery stenosis bilaterally. Patient presented to [facility] on 06/07 in order to undergo a CABG x 4.  Hospital Course: He was extubated early evening of surgery. He remained afebrile and hemodynamically stable. He was weaned off NItro (for radial artery graft) and Neo Synephrine drips. He was started on Lopressor. Theone Murdoch, a line, chest tubes, and foley were removed early in his post operative course. He was later started on Lopressor. He was volume overloaded and diuresed accordingly. He had expected post op blood loss anemia. He did not require a post op transfusion. H and H went as low as 11.4 and 35.1, but on 06/10 it was 11.5/34.3 **. He was weaned off the Insulin drip. He was restarted on Metformin and Actos closer to discharge. His pre op HGA1C was 7.9.  Inpatient diabetes program was consulted and  recommendations were followed accordingly. He will need close medical follow up after discharge. He was more hypertensive so Amlodipine was started.Marland Kitchen He was felt surgically stable for transfer from the ICU to 4E for further convalescence on 06/10. He was weaned down to 2 liters of oxygen and later weaned to room air. He is tolerating a diet and has had a bowel movement. His wounds are clean, dry, and healing without signs of infection. Epicardial pacing wires were removed on 06/12.

## 2021-04-27 NOTE — Anesthesia Procedure Notes (Signed)
Central Venous Catheter Insertion Performed by: Roderic Palau, MD, anesthesiologist Start/End6/05/2021 7:00 AM, 04/27/2021 7:15 AM Patient location: Pre-op. Preanesthetic checklist: patient identified, IV checked, site marked, risks and benefits discussed, surgical consent, monitors and equipment checked, pre-op evaluation, timeout performed and anesthesia consent Position: Trendelenburg Lidocaine 1% used for infiltration and patient sedated Hand hygiene performed , maximum sterile barriers used  and Seldinger technique used Catheter size: 9 Fr Total catheter length 10. Central line was placed.MAC introducer Procedure performed using ultrasound guided technique. Ultrasound Notes:anatomy identified, needle tip was noted to be adjacent to the nerve/plexus identified, no ultrasound evidence of intravascular and/or intraneural injection and image(s) printed for medical record Attempts: 1 Following insertion, line sutured, dressing applied and Biopatch. Post procedure assessment: blood return through all ports, free fluid flow and no air  Patient tolerated the procedure well with no immediate complications.

## 2021-04-27 NOTE — Anesthesia Procedure Notes (Signed)
Central Venous Catheter Insertion Performed by: Gaynelle Adu, MD, anesthesiologist Start/End6/05/2021 7:00 AM, 04/27/2021 7:15 AM Patient location: Pre-op. Preanesthetic checklist: patient identified, IV checked, site marked, risks and benefits discussed, surgical consent, monitors and equipment checked, pre-op evaluation, timeout performed and anesthesia consent Hand hygiene performed  and maximum sterile barriers used  PA cath was placed.Swan type:thermodilution PA Cath depth:50 Procedure performed without using ultrasound guided technique. Attempts: 1 Patient tolerated the procedure well with no immediate complications.

## 2021-04-27 NOTE — Transfer of Care (Signed)
Immediate Anesthesia Transfer of Care Note  Patient: Jacob Downs  Procedure(s) Performed: CORONARY ARTERY BYPASS GRAFTING (CABG) X FOUR,  USING LEFT INTERNAL MAMMARY ARTERY, RIGHT INTERNAL MAMMARY ARTERY, RIGHT RADIAL ARTERY CONDUITS (N/A Chest) possible RADIAL ARTERY HARVEST (Right Arm Lower) TRANSESOPHAGEAL ECHOCARDIOGRAM (TEE) (N/A ) INDOCYANINE GREEN FLUORESCENCE IMAGING (ICG) (N/A )  Patient Location: ICU  Anesthesia Type:General  Level of Consciousness: sedated  Airway & Oxygen Therapy: Patient remains intubated per anesthesia plan and Patient placed on Ventilator (see vital sign flow sheet for setting)  Post-op Assessment: Report given to RN and Post -op Vital signs reviewed and stable  Post vital signs: Reviewed and stable  Last Vitals:  Vitals Value Taken Time  BP    Temp 35.9 C 04/27/21 1408  Pulse 79 04/27/21 1408  Resp 17 04/27/21 1408  SpO2 98 % 04/27/21 1408  Vitals shown include unvalidated device data.  Last Pain:  Vitals:   04/27/21 0627  TempSrc:   PainSc: 0-No pain         Complications: No complications documented.

## 2021-04-27 NOTE — Anesthesia Procedure Notes (Signed)
Arterial Line Insertion Start/End6/05/2021 6:40 AM, 04/27/2021 6:50 AM Performed by: Elliot Dally, CRNA, CRNA  Preanesthetic checklist: patient identified, IV checked, site marked, risks and benefits discussed, surgical consent, monitors and equipment checked, pre-op evaluation, timeout performed and anesthesia consent Left, radial was placed Catheter size: 20 G Hand hygiene performed  and maximum sterile barriers used  Allen's test indicative of satisfactory collateral circulation Attempts: 1 Procedure performed without using ultrasound guided technique. Following insertion, dressing applied and Biopatch. Post procedure assessment: normal  Patient tolerated the procedure well with no immediate complications.

## 2021-04-28 ENCOUNTER — Encounter (HOSPITAL_COMMUNITY): Payer: Self-pay | Admitting: Cardiothoracic Surgery

## 2021-04-28 ENCOUNTER — Inpatient Hospital Stay (HOSPITAL_COMMUNITY): Payer: BC Managed Care – PPO

## 2021-04-28 LAB — BASIC METABOLIC PANEL
Anion gap: 10 (ref 5–15)
Anion gap: 9 (ref 5–15)
BUN: 9 mg/dL (ref 6–20)
BUN: 5 mg/dL — ABNORMAL LOW (ref 6–20)
CO2: 24 mmol/L (ref 22–32)
CO2: 24 mmol/L (ref 22–32)
Calcium: 7.1 mg/dL — ABNORMAL LOW (ref 8.9–10.3)
Calcium: 7.4 mg/dL — ABNORMAL LOW (ref 8.9–10.3)
Chloride: 102 mmol/L (ref 98–111)
Chloride: 103 mmol/L (ref 98–111)
Creatinine, Ser: 0.55 mg/dL — ABNORMAL LOW (ref 0.61–1.24)
Creatinine, Ser: 0.65 mg/dL (ref 0.61–1.24)
GFR, Estimated: >60 mL/min (ref >60)
GFR, Estimated: >60 mL/min (ref >60)
Glucose, Bld: 90 mg/dL (ref 70–99)
Glucose, Bld: 111 mg/dL — ABNORMAL HIGH (ref 70–99)
Potassium: 4.1 mmol/L (ref 3.5–5.1)
Potassium: 4.1 mmol/L (ref 3.5–5.1)
Sodium: 136 mmol/L (ref 135–145)
Sodium: 136 mmol/L (ref 135–145)

## 2021-04-28 LAB — MAGNESIUM
Magnesium: 2.4 mg/dL (ref 1.7–2.4)
Magnesium: 2.3 mg/dL (ref 1.7–2.4)

## 2021-04-28 LAB — CBC
HCT: 34.9 % — ABNORMAL LOW (ref 39.0–52.0)
HCT: 36.3 % — ABNORMAL LOW (ref 39.0–52.0)
Hemoglobin: 11.8 g/dL — ABNORMAL LOW (ref 13.0–17.0)
Hemoglobin: 11.8 g/dL — ABNORMAL LOW (ref 13.0–17.0)
MCH: 33.1 pg (ref 26.0–34.0)
MCH: 32.2 pg (ref 26.0–34.0)
MCHC: 33.8 g/dL (ref 30.0–36.0)
MCHC: 32.5 g/dL (ref 30.0–36.0)
MCV: 97.8 fL (ref 80.0–100.0)
MCV: 98.9 fL (ref 80.0–100.0)
Platelets: 150 10*3/uL (ref 150–400)
Platelets: 137 10*3/uL — ABNORMAL LOW (ref 150–400)
RBC: 3.57 MIL/uL — ABNORMAL LOW (ref 4.22–5.81)
RBC: 3.67 MIL/uL — ABNORMAL LOW (ref 4.22–5.81)
RDW: 13.3 % (ref 11.5–15.5)
RDW: 13.5 % (ref 11.5–15.5)
WBC: 7 10*3/uL (ref 4.0–10.5)
WBC: 6.5 10*3/uL (ref 4.0–10.5)
nRBC: 0 % (ref 0.0–0.2)
nRBC: 0 % (ref 0.0–0.2)

## 2021-04-28 LAB — GLUCOSE, CAPILLARY
Glucose-Capillary: 100 mg/dL — ABNORMAL HIGH (ref 70–99)
Glucose-Capillary: 102 mg/dL — ABNORMAL HIGH (ref 70–99)
Glucose-Capillary: 106 mg/dL — ABNORMAL HIGH (ref 70–99)
Glucose-Capillary: 107 mg/dL — ABNORMAL HIGH (ref 70–99)
Glucose-Capillary: 108 mg/dL — ABNORMAL HIGH (ref 70–99)
Glucose-Capillary: 110 mg/dL — ABNORMAL HIGH (ref 70–99)
Glucose-Capillary: 112 mg/dL — ABNORMAL HIGH (ref 70–99)
Glucose-Capillary: 114 mg/dL — ABNORMAL HIGH (ref 70–99)
Glucose-Capillary: 119 mg/dL — ABNORMAL HIGH (ref 70–99)
Glucose-Capillary: 88 mg/dL (ref 70–99)
Glucose-Capillary: 88 mg/dL (ref 70–99)
Glucose-Capillary: 89 mg/dL (ref 70–99)
Glucose-Capillary: 89 mg/dL (ref 70–99)
Glucose-Capillary: 91 mg/dL (ref 70–99)
Glucose-Capillary: 94 mg/dL (ref 70–99)
Glucose-Capillary: 95 mg/dL (ref 70–99)
Glucose-Capillary: 96 mg/dL (ref 70–99)
Glucose-Capillary: 96 mg/dL (ref 70–99)
Glucose-Capillary: 96 mg/dL (ref 70–99)
Glucose-Capillary: 96 mg/dL (ref 70–99)

## 2021-04-28 MED ORDER — THIAMINE HCL 100 MG/ML IJ SOLN
Freq: Once | INTRAVENOUS | Status: AC
  Filled 2021-04-28: qty 1000

## 2021-04-28 MED ORDER — DIAZEPAM 2 MG PO TABS
2.0000 mg | ORAL_TABLET | Freq: Three times a day (TID) | ORAL | Status: AC
  Administered 2021-04-28 – 2021-04-29 (×6): 2 mg via ORAL
  Filled 2021-04-28 (×7): qty 1

## 2021-04-28 MED ORDER — ALBUTEROL SULFATE (2.5 MG/3ML) 0.083% IN NEBU
3.0000 mL | INHALATION_SOLUTION | Freq: Four times a day (QID) | RESPIRATORY_TRACT | Status: DC
  Administered 2021-04-29 (×3): 3 mL via RESPIRATORY_TRACT
  Filled 2021-04-28 (×3): qty 3
  Filled 2021-04-28: qty 30

## 2021-04-28 MED ORDER — ALBUTEROL SULFATE (2.5 MG/3ML) 0.083% IN NEBU
3.0000 mL | INHALATION_SOLUTION | Freq: Four times a day (QID) | RESPIRATORY_TRACT | Status: DC
  Administered 2021-04-28 (×2): 3 mL via RESPIRATORY_TRACT
  Filled 2021-04-28 (×2): qty 3

## 2021-04-28 MED ORDER — ISOSORBIDE DINITRATE 10 MG PO TABS: 10.0000 mg | ORAL_TABLET | Freq: Three times a day (TID) | ORAL | Status: DC

## 2021-04-28 NOTE — Progress Notes (Signed)
Inpatient Diabetes Program Recommendations  AACE/ADA: New Consensus Statement on Inpatient Glycemic Control (2015)  Target Ranges:  Prepandial:   less than 140 mg/dL      Peak postprandial:   less than 180 mg/dL (1-2 hours)      Critically ill patients:  140 - 180 mg/dL   Lab Results  Component Value Date   GLUCAP 96 04/28/2021   HGBA1C 7.9 (H) 04/23/2021    Review of Glycemic Control Results for Jacob Downs, Jacob Downs (MRN [id number]) as of 04/28/2021 08:27  Ref. Range 04/28/2021 03:26 04/28/2021 04:44 04/28/2021 05:38 04/28/2021 06:56 04/28/2021 07:59  Glucose-Capillary Latest Ref Range: 70 - 99 mg/dL 91 96 643 (H) 96 96   Diabetes history:  DM2 Outpatient Diabetes medications:  Metformin 1000 mg BID Jardiance 25 mg QD Current orders for Inpatient glycemic control:  IV insulin  Inpatient Diabetes Program Recommendations:    Referral received for recommendations for insulin regimen.  POD1-CABG.  When MD is ready to transition to SQ insulin, please consider: TCTS glycemic control order set-Novolog 0-24 units Q4H X 24 hrs then 0-24 units TID and HS.  Will continue to follow while inpatient.  Thank you, Dulce Sellar, RN, BSN Diabetes Coordinator Inpatient Diabetes Program [phone] (team pager from 8a-5p)

## 2021-04-28 NOTE — Evaluation (Signed)
Physical Therapy Evaluation Patient Details Name: Jacob Downs MRN: [id number] DOB: 16-Jan-1961 Today's Date: 04/28/2021   History of Present Illness  60 y.o. male presents to [facility] hospital on 04/27/2021 for planned CABG due to worsening exertional angina and history of single-vessel LAD disease. Pt underwent CABG x4 on 04/27/2021. PMH includes CAD, DM, GERD, HLD, HTN, MI, TB.  Clinical Impression  Pt presents to PT with deficits in functional mobility, gait, balance, power, endurance, cardiopulmonary function, and with significant pain when mobilizing. Pt significant pain exacerbations with transfers and mobility, as well as difficulty breathing at times during session due to this discomfort. Pt benefits from PT verbal cues for transfer technique as well as physical assistance to facilitate bed mobility. Pt will continue to benefit from aggressive mobilization and further acute PT POC to improve activity tolerance and to aide in a return to independence.    Follow Up Recommendations Home health PT;Supervision for mobility/OOB    Equipment Recommendations  3in1 (PT) (may progress to no needs)    Recommendations for Other Services       Precautions / Restrictions Precautions Precautions: Fall Precaution Comments: chest tube x4, wound vac, IJ Restrictions Weight Bearing Restrictions: No Other Position/Activity Restrictions: sternal precautions      Mobility  Bed Mobility Overal bed mobility: Needs Assistance Bed Mobility: Sit to Supine       Sit to supine: Min assist   General bed mobility comments: assistance with LE elevation back into bed    Transfers Overall transfer level: Needs assistance Equipment used: None Transfers: Sit to/from Stand Sit to Stand: Min guard         General transfer comment: verbal cues for anterior trunk lean  Ambulation/Gait Ambulation/Gait assistance: Min assist Gait Distance (Feet): 3 Feet Assistive device: None Gait Pattern/deviations:  Step-to pattern;Wide base of support Gait velocity: reduced Gait velocity interpretation: <1.31 ft/sec, indicative of household ambulator General Gait Details: pt with short steps to pivot from recliner to bed, slowed gait speed at PT request for line management  Stairs            Wheelchair Mobility    Modified Rankin (Stroke Patients Only)       Balance Overall balance assessment: Needs assistance Sitting-balance support: No upper extremity supported;Feet supported Sitting balance-Leahy Scale: Fair     Standing balance support: No upper extremity supported Standing balance-Leahy Scale: Fair                               Pertinent Vitals/Pain Pain Assessment: Faces Faces Pain Scale: Hurts whole lot Pain Location: chest, RUE Pain Descriptors / Indicators: Aching Pain Intervention(s): Monitored during session    Home Living Family/patient expects to be discharged to:: Private residence Living Arrangements: Spouse/significant other Available Help at Discharge: Family;Available 24 hours/day Type of Home: House Home Access: Stairs to enter Entrance Stairs-Rails: None Entrance Stairs-Number of Steps: 1 Home Layout: Two level Home Equipment: Environmental consultant - 2 wheels;Crutches      Prior Function Level of Independence: Independent         Comments: works for a [employer]        Extremity/Trunk Assessment   Upper Extremity Assessment Upper Extremity Assessment: Generalized weakness (pain mediated for RUE, formal strength and ROM assessment limited due to sternal precautions)    Lower Extremity Assessment Lower Extremity Assessment: Generalized weakness    Cervical / Trunk Assessment Cervical /  Trunk Assessment: Other exceptions;Normal Cervical / Trunk Exceptions: IJ, chest tubes, sternal wound vac  Communication   Communication: No difficulties  Cognition Arousal/Alertness: Awake/alert Behavior During Therapy:  WFL for tasks assessed/performed Overall Cognitive Status: Within Functional Limits for tasks assessed                                        General Comments General comments (skin integrity, edema, etc.): pt on 3L [place] initially, HR stable during session. PT weans pt to room air for transfer with pt desaturating briefly to 60% SpO2, PT places pt on 6L with quicky return to 94% SpO2 within ~15 seconds. PT then weans pt back to 3L [place] by end of session, all vital signs stable upon PT departure    Exercises     Assessment/Plan    PT Assessment Patient needs continued PT services  PT Problem List Decreased strength;Decreased activity tolerance;Decreased balance;Decreased mobility;Decreased knowledge of use of DME;Decreased knowledge of precautions;Cardiopulmonary status limiting activity;Pain       PT Treatment Interventions DME instruction;Gait training;Stair training;Functional mobility training;Therapeutic activities;Therapeutic exercise;Balance training;Patient/family education    PT Goals (Current goals can be found in the Care Plan section)  Acute Rehab PT Goals Patient Stated Goal: to return to independence and reduce pain PT Goal Formulation: With patient Time For Goal Achievement: 05/12/21 Potential to Achieve Goals: Good    Frequency Min 3X/week   Barriers to discharge        Co-evaluation               AM-PAC PT "6 Clicks" Mobility  Outcome Measure Help needed turning from your back to your side while in a flat bed without using bedrails?: A Little Help needed moving from lying on your back to sitting on the side of a flat bed without using bedrails?: A Little Help needed moving to and from a bed to a chair (including a wheelchair)?: A Little Help needed standing up from a chair using your arms (e.g., wheelchair or bedside chair)?: A Little Help needed to walk in hospital room?: A Lot Help needed climbing 3-5 steps with a railing? : A Lot 6 Click  Score: 16    End of Session Equipment Utilized During Treatment: Oxygen Activity Tolerance: Patient limited by pain Patient left: in bed;with call bell/phone within reach;with family/visitor present Nurse Communication: Mobility status PT Visit Diagnosis: Unsteadiness on feet (R26.81);Other abnormalities of gait and mobility (R26.89);Muscle weakness (generalized) (M62.81);Pain Pain - Right/Left: Right Pain - part of body: Arm (chest)    Time: 1610-9604 PT Time Calculation (min) (ACUTE ONLY): 36 min   Charges:   PT Evaluation $PT Eval Low Complexity: 1 Low          Arlyss Gandy, PT, DPT Acute Rehabilitation Pager: [phone]   Arlyss Gandy 04/28/2021, 4:25 PM

## 2021-04-28 NOTE — Progress Notes (Signed)
EVENING ROUNDS NOTE :     [address]       [place] [id number]             [phone]                 1 Day Post-Op Procedure(s) (LRB): CORONARY ARTERY BYPASS GRAFTING (CABG) X FOUR,  USING LEFT INTERNAL MAMMARY ARTERY, RIGHT INTERNAL MAMMARY ARTERY, RIGHT RADIAL ARTERY CONDUITS (N/A) possible RADIAL ARTERY HARVEST (Right) TRANSESOPHAGEAL ECHOCARDIOGRAM (TEE) (N/A) INDOCYANINE GREEN FLUORESCENCE IMAGING (ICG) (N/A)   Total Length of Stay:  LOS: 1 day  Events:   No events    BP (!) 145/86   Pulse 89   Temp 98.24 F (36.8 C)   Resp 20   Ht 6' (1.829 m)   Wt 118.3 kg   SpO2 97%   BMI 35.37 kg/m   PAP: (22-51)/(4-25) 34/9 CO:  [4.5 L/min-6.8 L/min] 6.8 L/min CI:  [1.9 L/min/m2-2.9 L/min/m2] 2.9 L/min/m2     . sodium chloride    . sodium chloride 10 mL/hr at 04/27/21 1355  .  ceFAZolin (ANCEF) IV Stopped (04/28/21 1212)  . dexmedetomidine (PRECEDEX) IV infusion Stopped (04/27/21 1718)  . insulin 0.6 mL/hr at 04/28/21 1800  . lactated ringers    . lactated ringers    . lactated ringers 20 mL/hr at 04/28/21 1800  . nitroGLYCERIN Stopped (04/28/21 0909)  . phenylephrine (NEO-SYNEPHRINE) Adult infusion 20 mcg/min (04/28/21 1800)    I/O last 3 completed shifts: In: 6807.7 [I.V.:4896.2; Blood:514; IV Piggyback:1397.5] Out: 3726 [Urine:2715; Chest Tube:1011]   CBC Latest Ref Rng & Units 04/28/2021 04/28/2021 04/27/2021  WBC 4.0 - 10.5 K/uL 6.5 7.0 8.0  Hemoglobin 13.0 - 17.0 g/dL 11.8(L) 11.8(L) 11.4(L)  Hematocrit 39.0 - 52.0 % 36.3(L) 34.9(L) 34.7(L)  Platelets 150 - 400 K/uL 137(L) 150 137(L)    BMP Latest Ref Rng & Units 04/28/2021 04/28/2021 04/27/2021  Glucose 70 - 99 mg/dL 539(J) 90 673(A)  BUN 6 - 20 mg/dL 5(L) 9 10  Creatinine 1.93 - 1.24 mg/dL 7.90 2.40(X) 7.35(H)  BUN/Creat Ratio 10 - 24 - - -  Sodium 135 - 145 mmol/L 136 136 139  Potassium 3.5 - 5.1 mmol/L 4.1 4.1 4.3  Chloride 98 - 111 mmol/L 103 102 107  CO2 22 - 32 mmol/L 24 24 25    Calcium 8.9 - 10.3 mg/dL 7.4(L) 7.1(L) 7.0(L)    ABG    Component Value Date/Time   PHART 7.285 (L) 04/27/2021 1830   PCO2ART 51.9 (H) 04/27/2021 1830   PO2ART 99 04/27/2021 1830   HCO3 24.7 04/27/2021 1830   TCO2 26 04/27/2021 1830   ACIDBASEDEF 2.0 04/27/2021 1830   O2SAT 97.0 04/27/2021 1830       06/27/2021, MD 04/28/2021 6:56 PM

## 2021-04-28 NOTE — Addendum Note (Signed)
Addendum  created 04/28/21 0100 by Adair Laundry, CRNA   Order list changed, Pharmacy for encounter modified

## 2021-04-28 NOTE — Progress Notes (Signed)
1 Day Post-Op Procedure(s) (LRB): CORONARY ARTERY BYPASS GRAFTING (CABG) X FOUR,  USING LEFT INTERNAL MAMMARY ARTERY, RIGHT INTERNAL MAMMARY ARTERY, RIGHT RADIAL ARTERY CONDUITS (N/A) possible RADIAL ARTERY HARVEST (Right) TRANSESOPHAGEAL ECHOCARDIOGRAM (TEE) (N/A) INDOCYANINE GREEN FLUORESCENCE IMAGING (ICG) (N/A) Subjective: Back pain  Objective: Vital signs in last 24 hours: Temp:  [96.44 F (35.8 C)-98.8 F (37.1 C)] 98.06 F (36.7 C) (06/08 0800) Pulse Rate:  [70-92] 85 (06/08 0800) Cardiac Rhythm: Normal sinus rhythm (06/08 0800) Resp:  [12-29] 17 (06/08 0800) BP: (107-156)/(72-113) 149/90 (06/08 0800) SpO2:  [96 %-100 %] 99 % (06/08 0800) Arterial Line BP: (84-149)/(43-84) 121/54 (06/08 0800) FiO2 (%):  [36 %-50 %] 36 % (06/07 1731) Weight:  [118.3 kg] 118.3 kg (06/08 0500)  Hemodynamic parameters for last 24 hours: PAP: (22-51)/(4-27) 32/7 CO:  [1.5 L/min-6.8 L/min] 6.8 L/min CI:  [1.5 L/min/m2-3.5 L/min/m2] 2.9 L/min/m2  Intake/Output from previous day: 06/07 0701 - 06/08 0700 In: 6807.7 [I.V.:4896.2; Blood:514; IV Piggyback:1397.5] Out: 3726 [Urine:2715; Chest Tube:1011] Intake/Output this shift: Total I/O In: 136 [I.V.:136] Out: 90 [Urine:30; Chest Tube:60]  General appearance: alert and cooperative Neurologic: intact Heart: regular rate and rhythm, S1, S2 normal, no murmur, click, rub or gallop Lungs: clear to auscultation bilaterally Abdomen: soft, non-tender; bowel sounds normal; no masses,  no organomegaly Extremities: edema mild Wound: c/d/i  Lab Results: Recent Labs    04/27/21 2011 04/28/21 0330  WBC 8.0 7.0  HGB 11.4* 11.8*  HCT 34.7* 34.9*  PLT 137* 150   BMET:  Recent Labs    04/27/21 2011 04/28/21 0330  NA 139 136  K 4.3 4.1  CL 107 102  CO2 25 24  GLUCOSE 103* 90  BUN 10 9  CREATININE 0.57* 0.55*  CALCIUM 7.0* 7.1*    PT/INR:  Recent Labs    04/27/21 1344  LABPROT 16.9*  INR 1.4*   ABG    Component Value Date/Time    PHART 7.285 (L) 04/27/2021 1830   HCO3 24.7 04/27/2021 1830   TCO2 26 04/27/2021 1830   ACIDBASEDEF 2.0 04/27/2021 1830   O2SAT 97.0 04/27/2021 1830   CBG (last 3)  Recent Labs    04/28/21 0538 04/28/21 0656 04/28/21 0759  GLUCAP 102* 96 96    Assessment/Plan: S/P Procedure(s) (LRB): CORONARY ARTERY BYPASS GRAFTING (CABG) X FOUR,  USING LEFT INTERNAL MAMMARY ARTERY, RIGHT INTERNAL MAMMARY ARTERY, RIGHT RADIAL ARTERY CONDUITS (N/A) possible RADIAL ARTERY HARVEST (Right) TRANSESOPHAGEAL ECHOCARDIOGRAM (TEE) (N/A) INDOCYANINE GREEN FLUORESCENCE IMAGING (ICG) (N/A) Mobilize d/c pa cath  D/c arterial line Gentle resuscitation Pain control; Diabetes coordinator PT/OT   LOS: 1 day    Linden Dolin 04/28/2021

## 2021-04-28 NOTE — Discharge Summary (Signed)
Physician Discharge Summary       [address] [place]       [place] [address]             [phone]    Patient ID: Jacob Downs MRN: [id number] DOB/AGE: 1961-06-28 60 y.o.  Admit date: 04/27/2021 Discharge date: 05/03/2021  Admission Diagnoses:   Coronary artery disease  Discharge Diagnoses:  1. S/P CABG x 4 2. Expected post op blood loss anemia 3. History of the following: Arthritis      Cellulitis of left hand 11/27/2015        Diabetes mellitus without complication (HCC)     GERD (gastroesophageal reflux disease)     Hyperlipemia     Hypertension     Myocardial infarction (HCC)     Shingles      4-5 years ago   Tuberculosis     Consults: None  Procedure (s):  Coronary Artery Bypass Grafting x 4             Left Internal Mammary Artery to Distal Left Anterior Descending Coronary Artery; pedicled right internal mammary artery to posterior Descending Coronary Artery; right radial artery graft to second obtuse Marginal Branch of Left Circumflex Coronary Artery and second diagonal Branch Coronary Artery as a sequenced graft  Open right radial artery harvesting Bilateral internal mammary artery harvesting Completion graft surveillance with indocyanine green fluorescence imaging by Dr. Vickey Sages on 04/27/2021.   History of Presenting Illness: This is a 60 year old man with 5-year history of coronary artery disease status post left heart catheterization in 2017 which demonstrated single-vessel disease of the LAD is now referred for consideration of CABG status post left heart catheterization last week which showed three-vessel disease.  This was done to assess exertional angina which had worsened in the past month or so.  His pain particularly arose with heavy exertion and responded to sublingual nitroglycerin.  The patient also notes cough which is sometimes productive of clear sputum but without fever.  This has been going on for several weeks.  He has tested  negative for flu and COVID.  He has not started any new medications which might explain the new cough.   His cardiac risk factors are otherwise notable for diabetes, hyperlipidemia, and hypertension. Dr. Vickey Sages discussed the need for coronary artery bypass grafting surgery. Potential risks, benefits, and complications of the surgery were discussed with the patient and he agreed to proceed with surgery. Pre operative carotid duplex US showed no significant internal carotid artery stenosis bilaterally. Patient presented to [facility] on 06/07 in order to undergo a CABG x 4.  Brief Hospital Course:  He was extubated early evening of surgery. He remained afebrile and hemodynamically stable. He was weaned off NItro (for radial artery graft) and Neo Synephrine drips. He was started on Lopressor, which was titrated accordingly. Theone Murdoch, a line, chest tubes, and foley were removed early in his post operative course. He was later started on Lopressor. He was volume overloaded and diuresed accordingly. He had expected post op blood loss anemia. He did not require a post op transfusion. H and H went as low as 11.5 and 34.3. He was weaned off the Insulin drip. He was restarted on Metformin and Actos closer to discharge. His pre op HGA1C was 7.9.  Inpatient diabetes program was consulted and recommendations were followed accordingly. He will need close medical follow up after discharge. He was more hypertensive so Amlodipine was started. He had some atrial fibrillation. He  was put on Amiodarone. He converted and remained in sinus rhythm. He was felt surgically stable for transfer from the ICU to 4E for further convalescence on 06/10. He was weaned down to 2 liters of oxygen and later weaned to room air. He is tolerating a diet . His wounds are clean, dry, and healing without signs of infection. RUE motor/sensory intact. Epicardial pacing wires were removed on 06/12. Sternal and RUE wounds are clean, dry, and healing  without signs of infection. Chest tube sutures were removed on 06/13. He has been passing flatus and feels he is about to have a bowel movement. He states it is not abnormal for him to not have a bowel movement for up to one week. He is felt surgically stable for discharge today.  Latest Vital Signs: Blood pressure 125/77, pulse 88, temperature 97.6 F (36.4 C), temperature source Oral, resp. rate 15, height 6' (1.829 m), weight 115.8 kg, SpO2 94 %.  Physical Exam:  Cardiovascular: RRR Pulmonary: Slightly diminished bibasilar breath sounds Abdomen: Soft, obese, non tender, bowel sounds present. Extremities: Mild bilateral lower extremity edema. RUE motor/sensory intact Wounds: Sternal and RUE wounds are clean and dry.  No erythema or signs of infection.  Discharge Condition:Stable and discharged to home.  Recent laboratory studies:  Lab Results  Component Value Date   WBC 6.1 04/30/2021   HGB 11.5 (L) 04/30/2021   HCT 34.3 (L) 04/30/2021   MCV 99.4 04/30/2021   PLT 139 (L) 04/30/2021   Lab Results  Component Value Date   NA 140 05/02/2021   K 3.4 (L) 05/02/2021   CL 99 05/02/2021   CO2 35 (H) 05/02/2021   CREATININE 0.60 (L) 05/02/2021   GLUCOSE 126 (H) 05/02/2021     Diagnostic Studies: DG Chest 2 View  Result Date: 05/03/2021 CLINICAL DATA:  Recent coronary artery bypass grafting EXAM: CHEST - 2 VIEW COMPARISON:  April 30, 2021 FINDINGS: Cordis has been removed. No pneumothorax. There is no appreciable edema or airspace opacity. Heart is upper normal in size with pulmonary vascularity normal. No adenopathy. Postoperative changes noted. No bone lesions. IMPRESSION: No pneumothorax. Lungs clear. Heart upper normal in size, stable, with postoperative changes. Electronically Signed   By: Bretta Bang III M.D.   On: 05/03/2021 08:36   DG Chest 2 View  Result Date: 04/23/2021 CLINICAL DATA:  Preoperative examination. EXAM: CHEST - 2 VIEW COMPARISON:  PA and lateral chest  11/29/2015. FINDINGS: The lungs are clear. Heart size is normal. No pneumothorax or pleural fluid. No acute or focal bony abnormality. IMPRESSION: Negative chest. Electronically Signed   By: Drusilla Kanner M.D.   On: 04/23/2021 11:13   CARDIAC CATHETERIZATION  Result Date: 04/15/2021  Ost LAD to Prox LAD lesion is 20% stenosed.  Prox LAD to Mid LAD lesion is 90% stenosed.  Mid LAD lesion is 70% stenosed.  1st Diag-1 lesion is 75% stenosed.  1st Diag-2 lesion is 75% stenosed.  2nd Mrg lesion is 75% stenosed.  1st Mrg lesion is 20% stenosed.  Prox RCA to Dist RCA lesion is 40% stenosed.  RPAV lesion is 30% stenosed.  Mid Cx lesion is 80% stenosed.  Ost Cx to Prox Cx lesion is 25% stenosed.  Prox RCA lesion is 60% stenosed. Significant by FFR.  The left ventricular systolic function is normal.  LV end diastolic pressure is normal.  The left ventricular ejection fraction is 55-65% by visual estimate.  There is no aortic valve stenosis.  Severe disease in the heavily  calcified LAD including bifurcation disease with large diagonal. Bifurcation lesion at the mid circumflex, OM. RCA with angiographically moderate disease.  Heavily calcified.  FFR 0.77 after IV adenosine. Plan for cardiac surgery consult for multivessel CAD.  He will needs grafts to LAD, large diagonal, OM2, OM3 and PDA.   DG Chest Port 1 View  Result Date: 04/30/2021 CLINICAL DATA:  Chest pain EXAM: PORTABLE CHEST 1 VIEW COMPARISON:  April 29, 2021 FINDINGS: Cordis tip is in the superior vena cava. There is a chest tube on each side. There is also a mediastinal drain. No appreciable pneumothorax. There is a small left pleural effusion. There is atelectatic change in the left mid lung and medial bibasilar regions. No new opacity evident. Heart is mildly enlarged with pulmonary vascularity stable and within normal limits. Status post median sternotomy. No bone lesions. IMPRESSION: Tube and catheter positions as described. No evident  pneumothorax. Small left pleural effusion. Atelectasis left mid lung and bibasilar regions. Electronically Signed   By: Bretta Bang III M.D.   On: 04/30/2021 08:04   DG Chest Port 1 View  Result Date: 04/29/2021 CLINICAL DATA:  Chest tube.  Open-heart surgery. EXAM: PORTABLE CHEST 1 VIEW COMPARISON:  04/28/2021. FINDINGS: Interim removal of Swan-Ganz catheter. Right IJ sheath in stable position. Mediastinal drainage catheter and bilateral chest tubes in stable position. Prior CABG. Stable cardiomegaly. Bibasilar atelectasis again noted. Tiny left pleural effusion noted. No pneumothorax. IMPRESSION: 1. Interim removal Swan-Ganz catheter. Right IJ sheath in stable position. Mediastinal drainage catheter bilateral chest tubes in stable position. No pneumothorax. 2. Prior CABG. Stable cardiomegaly. Bibasilar atelectasis again noted. Tiny left pleural effusion again noted. Electronically Signed   By: Maisie Fus  Register   On: 04/29/2021 08:00   DG Chest Port 1 View  Result Date: 04/28/2021 CLINICAL DATA:  Chest tube preset s/p open heart surg sore chest EXAM: PORTABLE CHEST 1 VIEW COMPARISON:  Chest x-ray 04/27/2021 FINDINGS: Interval slight retraction of a right internal jugular vein approach Swan-Ganz catheter with tip overlying the expected region of the main pulmonary artery. Mediastinal drain again noted. Bilateral chest tubes in stable position. Interval removal of an enteric tube. Interval removal of an endotracheal tube Enlarged cardiac silhouette. The heart size and mediastinal contours are unchanged. Cardiac surgical changes overlie the mediastinum. Atelectasis at the right base. No focal consolidation. No pulmonary edema. Interval development of a trace left pleural effusion. No pneumothorax. No acute osseous abnormality. IMPRESSION: 1. Interval development of a trace left pleural effusion. 2. Interval removal of enteric and endotracheal tube. 3. Interval slight retraction of a right internal jugular  vein approach Swan-Ganz catheter with tip overlying the expected region of the main pulmonary artery. Electronically Signed   By: Tish Frederickson M.D.   On: 04/28/2021 07:00   DG Chest Port 1 View  Result Date: 04/27/2021 CLINICAL DATA:  Evaluate for pneumothorax. EXAM: PORTABLE CHEST 1 VIEW COMPARISON:  April 23, 2021 FINDINGS: An endotracheal tube is seen with its distal tip approximately 4.0 cm from the carina. A nasogastric tube is noted with its distal end seen within the body of the stomach. The distal side hole is approximately 2.7 cm distal to the gastroesophageal junction. A Swan-Ganz catheter is seen with its distal tip overlying the expected region of the left pulmonary artery. Bilateral chest tubes are in place. Multiple sternal wires are noted. Mild atelectasis is seen within the right lung base and mid left lung. There is no evidence of a pleural effusion or pneumothorax. The  heart size and mediastinal contours are within normal limits. The visualized skeletal structures are unremarkable. IMPRESSION: 1. Interval median sternotomy/CABG since the prior study with multiple support line placement positioning, as described above. Electronically Signed   By: Aram Candelahaddeus  Houston M.D.   On: 04/27/2021 14:52   ECHOCARDIOGRAM COMPLETE  Result Date: 04/23/2021    ECHOCARDIOGRAM REPORT   Patient Name:   Jacob SilvanSTEVEN R Downs Date of Exam: 04/23/2021 Medical Rec #:  [id number]        Height:       72.0 in Accession #:    [id number]       Weight:       247.2 lb Date of Birth:  20-Jan-1961        BSA:          2.331 m Patient Age:    60 years         BP:           121/82 mmHg Patient Gender: M                HR:           74 bpm. Exam Location:  Outpatient Procedure: 2D Echo, Color Doppler and Cardiac Doppler Indications:    CAD Native Vessel i25.10  History:        Patient has prior history of Echocardiogram examinations, most                 recent 11/30/2015. CAD; Risk Factors:Hypertension, Diabetes and                  Dyslipidemia.  Sonographer:    Irving BurtonEmily Senior RDCS Referring Phys: [id number] BROADUS Z ATKINS IMPRESSIONS  1. Left ventricular ejection fraction, by estimation, is 60 to 65%. The left ventricle has normal function. Left ventricular endocardial border not optimally defined to evaluate regional wall motion. Left ventricular diastolic parameters are consistent with Grade I diastolic dysfunction (impaired relaxation).  2. Right ventricular systolic function is normal. The right ventricular size is normal. Tricuspid regurgitation signal is inadequate for assessing PA pressure.  3. The mitral valve is grossly normal. Trivial mitral valve regurgitation. No evidence of mitral stenosis.  4. The aortic valve is tricuspid. Aortic valve regurgitation is not visualized. No aortic stenosis is present.  5. The inferior vena cava is normal in size with greater than 50% respiratory variability, suggesting right atrial pressure of 3 mmHg. Conclusion(s)/Recommendation(s): Normal biventricular function without evidence of hemodynamically significant valvular heart disease. FINDINGS  Left Ventricle: Left ventricular ejection fraction, by estimation, is 60 to 65%. The left ventricle has normal function. Left ventricular endocardial border not optimally defined to evaluate regional wall motion. The left ventricular internal cavity size was normal in size. There is no left ventricular hypertrophy. Left ventricular diastolic parameters are consistent with Grade I diastolic dysfunction (impaired relaxation). Right Ventricle: The right ventricular size is normal. No increase in right ventricular wall thickness. Right ventricular systolic function is normal. Tricuspid regurgitation signal is inadequate for assessing PA pressure. Left Atrium: Left atrial size was normal in size. Right Atrium: Right atrial size was normal in size. Pericardium: Trivial pericardial effusion is present. Mitral Valve: The mitral valve is grossly normal. Trivial mitral  valve regurgitation. No evidence of mitral valve stenosis. Tricuspid Valve: The tricuspid valve is grossly normal. Tricuspid valve regurgitation is not demonstrated. No evidence of tricuspid stenosis. Aortic Valve: The aortic valve is tricuspid. Aortic valve regurgitation is not visualized. No aortic stenosis is present. Pulmonic Valve:  The pulmonic valve was grossly normal. Pulmonic valve regurgitation is trivial. No evidence of pulmonic stenosis. Aorta: The aortic root and ascending aorta are structurally normal, with no evidence of dilitation. Venous: The inferior vena cava is normal in size with greater than 50% respiratory variability, suggesting right atrial pressure of 3 mmHg. IAS/Shunts: The atrial septum is grossly normal.  LEFT VENTRICLE PLAX 2D LVIDd:         4.90 cm  Diastology LVIDs:         2.90 cm  LV e' medial:    5.11 cm/s LV PW:         1.10 cm  LV E/e' medial:  8.7 LV IVS:        0.80 cm  LV e' lateral:   9.25 cm/s LVOT diam:     2.20 cm  LV E/e' lateral: 4.8 LV SV:         53 LV SV Index:   23 LVOT Area:     3.80 cm  RIGHT VENTRICLE RV S prime:     12.60 cm/s TAPSE (M-mode): 2.0 cm LEFT ATRIUM             Index       RIGHT ATRIUM           Index LA diam:        3.40 cm 1.46 cm/m  RA Area:     16.20 cm LA Vol (A2C):   45.0 ml 19.30 ml/m RA Volume:   47.30 ml  20.29 ml/m LA Vol (A4C):   58.7 ml 25.18 ml/m LA Biplane Vol: 53.4 ml 22.91 ml/m  AORTIC VALVE LVOT Vmax:   66.60 cm/s LVOT Vmean:  51.400 cm/s LVOT VTI:    0.139 m  AORTA Ao Root diam: 3.60 cm Ao Asc diam:  3.50 cm MITRAL VALVE MV Area (PHT): 2.07 cm    SHUNTS MV Decel Time: 366 msec    Systemic VTI:  0.14 m MV E velocity: 44.60 cm/s  Systemic Diam: 2.20 cm MV A velocity: 55.30 cm/s MV E/A ratio:  0.81 Lennie Odor MD Electronically signed by Lennie Odor MD Signature Date/Time: 04/23/2021/4:51:12 PM    Final    ECHO INTRAOPERATIVE TEE  Result Date: 04/27/2021  *INTRAOPERATIVE TRANSESOPHAGEAL REPORT *  Patient Name:   Jacob Downs Date of Exam: 04/27/2021 Medical Rec #:  [id number]        Height:       72.0 in Accession #:    [id number]       Weight:       248.0 lb Date of Birth:  01-Jul-1961        BSA:          2.33 m Patient Age:    60 years         BP:           145/80 mmHg Patient Gender: M                HR:           75 bpm. Exam Location:  Anesthesiology Transesophogeal exam was perform intraoperatively during surgical procedure. Patient was closely monitored under general anesthesia during the entirety of examination. Indications:     CAD, multiple vessel [I25.10 Sonographer:     Leta Jungling RDCS Performing Phys: [id number] HYQMVHQ Z ATKINS Diagnosing Phys: Gaynelle Adu MD Complications: No known complications during this procedure. POST-OP IMPRESSIONS Overall, there were no significant changes from pre-bypass. PRE-OP FINDINGS  Left  Ventricle: The left ventricle has mildly reduced systolic function, with an ejection fraction of 45-50%. The cavity size was normal. There is no increase in left ventricular wall thickness. There is no left ventricular hypertrophy. Right Ventricle: The right ventricle has normal systolic function. The cavity was not assessed. There is no increase in right ventricular wall thickness. Left Atrium: Left atrial size was not assessed. No left atrial/left atrial appendage thrombus was detected. Right Atrium: Right atrial size was not assessed. Interatrial Septum: No atrial level shunt detected by color flow Doppler. Pericardium: There is no evidence of pericardial effusion. Mitral Valve: The mitral valve is normal in structure. Mitral valve regurgitation is trivial by color flow Doppler. Tricuspid Valve: The tricuspid valve was normal in structure. Tricuspid valve regurgitation is trivial by color flow Doppler. Aortic Valve: The aortic valve is normal in structure. Aortic valve regurgitation was not visualized by color flow Doppler. Pulmonic Valve: The pulmonic valve was normal in structure. Pulmonic  valve regurgitation is trivial by color flow Doppler. +--------------+--------++ LEFT VENTRICLE         +--------------+--------++ PLAX 2D                +--------------+--------++ LVIDd:        4.70 cm  +--------------+--------++ LVIDs:        3.55 cm  +--------------+--------++ LVOT diam:    1.90 cm  +--------------+--------++ LV SV:        50 ml    +--------------+--------++ LV SV Index:  20.50    +--------------+--------++ LVOT Area:    2.84 cm +--------------+--------++                        +--------------+--------++  +-------------+-------++ AORTA                +-------------+-------++ Ao Root diam:3.80 cm +-------------+-------++ Ao Asc diam: 3.50 cm +-------------+-------++  +--------------+-------+ SHUNTS                +--------------+-------+ Systemic Diam:1.90 cm +--------------+-------+  Gaynelle Adu MD Electronically signed by Gaynelle Adu MD Signature Date/Time: 04/27/2021/2:51:31 PM    Final    VAS US DOPPLER PRE CABG  Result Date: 04/24/2021 PREOPERATIVE VASCULAR EVALUATION Patient Name:  Jacob Downs  Date of Exam:   04/23/2021 Medical Rec #: [id number]         Accession #:    [id number] Date of Birth: June 12, 1961         Patient Gender: M Patient Age:   60Y Exam Location:  [facility] Procedure:      VAS US DOPPLER PRE CABG Referring Phys: [id number] BROADUS Z ATKINS --------------------------------------------------------------------------------  Indications:      Pre-CABG. Risk Factors:     Hypertension, hyperlipidemia, Diabetes, coronary artery                   disease. Comparison Study: no prior Performing Technologist: [place] Ponder RVS  Examination Guidelines: A complete evaluation includes B-mode imaging, spectral Doppler, color Doppler, and power Doppler as needed of all accessible portions of each vessel. Bilateral testing is considered an integral part of a complete examination. Limited examinations for  reoccurring indications may be performed as noted.  Right Carotid Findings: +----------+--------+--------+--------+------------+--------+           PSV cm/sEDV cm/sStenosisDescribe    Comments +----------+--------+--------+--------+------------+--------+ CCA Prox  96      6               heterogenous         +----------+--------+--------+--------+------------+--------+  CCA Distal71      8               heterogenous         +----------+--------+--------+--------+------------+--------+ ICA Prox  66      17      1-39%   heterogenous         +----------+--------+--------+--------+------------+--------+ ICA Distal50      17                                   +----------+--------+--------+--------+------------+--------+ ECA       74                                           +----------+--------+--------+--------+------------+--------+ Portions of this table do not appear on this page. +----------+--------+-------+--------+------------+           PSV cm/sEDV cmsDescribeArm Pressure +----------+--------+-------+--------+------------+ [id number]                                  +----------+--------+-------+--------+------------+ +---------+--------+--+--------+--+---------+ VertebralPSV cm/s28EDV cm/s14Antegrade +---------+--------+--+--------+--+---------+ Left Carotid Findings: +----------+--------+--------+--------+------------+--------+           PSV cm/sEDV cm/sStenosisDescribe    Comments +----------+--------+--------+--------+------------+--------+ CCA Prox  89      20              heterogenous         +----------+--------+--------+--------+------------+--------+ CCA Distal72      16              heterogenous         +----------+--------+--------+--------+------------+--------+ ICA Prox  58      23      1-39%   heterogenous         +----------+--------+--------+--------+------------+--------+ ICA Distal56      21                                    +----------+--------+--------+--------+------------+--------+ ECA       58                                           +----------+--------+--------+--------+------------+--------+ +----------+--------+--------+--------+------------+ SubclavianPSV cm/sEDV cm/sDescribeArm Pressure +----------+--------+--------+--------+------------+           117                                  +----------+--------+--------+--------+------------+ +---------+--------+--+--------+--+---------+ VertebralPSV cm/s20EDV cm/s10Antegrade +---------+--------+--+--------+--+---------+  ABI Findings: +--------+------------------+-----+---------+--------+ Right   Rt Pressure (mmHg)IndexWaveform Comment  +--------+------------------+-----+---------+--------+ [id number]                    triphasic         +--------+------------------+-----+---------+--------+ PTA     163               1.23 triphasic         +--------+------------------+-----+---------+--------+ PERO    135               1.02 triphasic         +--------+------------------+-----+---------+--------+ +--------+------------------+-----+---------+-------+ Left    Lt Pressure (mmHg)IndexWaveform Comment +--------+------------------+-----+---------+-------+ [id number]  triphasic        +--------+------------------+-----+---------+-------+ PTA     167               1.26 triphasic        +--------+------------------+-----+---------+-------+ PERO    142               1.07 triphasic        +--------+------------------+-----+---------+-------+ +-------+---------------+----------------+ ABI/TBIToday's ABI/TBIPrevious ABI/TBI +-------+---------------+----------------+ Right  1.23                            +-------+---------------+----------------+ Left   1.26                            +-------+---------------+----------------+  Right Doppler Findings:  +----------+--------+-----+---------+--------+ Site      PressureIndexDoppler  Comments +----------+--------+-----+---------+--------+ [id number]                            +----------+--------+-----+---------+--------+ Brachial  133          triphasic         +----------+--------+-----+---------+--------+ Radial                 triphasic         +----------+--------+-----+---------+--------+ Ulnar                  triphasic         +----------+--------+-----+---------+--------+  Left Doppler Findings: +----------+--------+-----+---------+--------+ Site      PressureIndexDoppler  Comments +----------+--------+-----+---------+--------+ Subclavian129                            +----------+--------+-----+---------+--------+ Brachial  129          triphasic         +----------+--------+-----+---------+--------+ Radial                 triphasic         +----------+--------+-----+---------+--------+ Ulnar                  triphasic         +----------+--------+-----+---------+--------+  Summary: Right Carotid: Velocities in the right ICA are consistent with a 1-39% stenosis. Left Carotid: Velocities in the left ICA are consistent with a 1-39% stenosis. Vertebrals: Bilateral vertebral arteries demonstrate antegrade flow. Right ABI: Resting right ankle-brachial index is within normal range. No evidence of significant right lower extremity arterial disease. Left ABI: Resting left ankle-brachial index is within normal range. No evidence of significant left lower extremity arterial disease. Right Upper Extremity: Doppler waveforms remain within normal limits with right radial compression. Doppler waveforms decrease 50% with right ulnar compression. Left Upper Extremity: Doppler waveforms remain within normal limits with left radial compression. Doppler waveforms remain within normal limits with left ulnar compression.  Electronically signed by Fabienne Bruns MD on  04/24/2021 at 11:55:45 AM.    Final       Discharge Medications: Allergies as of 05/03/2021       Reactions   Glimepiride Other (See Comments)   hypoglycemia        Medication List     STOP taking these medications    Combivent Respimat 20-100 MCG/ACT Aers respimat Generic drug: Ipratropium-Albuterol   ibuprofen 200 MG tablet Commonly known as: ADVIL   isosorbide mononitrate 60 MG 24 hr tablet Commonly known as: IMDUR   nitroGLYCERIN 0.4 MG SL tablet  Commonly known as: NITROSTAT       TAKE these medications    amiodarone 400 MG tablet Commonly known as: PACERONE Take 1 tablet (400 mg total) by mouth 2 (two) times daily. For 7 days then once daily   amLODipine 5 MG tablet Commonly known as: NORVASC Take 1 tablet (5 mg total) by mouth daily.   aspirin 325 MG EC tablet Take 1 tablet (325 mg total) by mouth daily. What changed:  medication strength how much to take when to take this additional instructions   atorvastatin 80 MG tablet Commonly known as: LIPITOR Take 1 tablet (80 mg total) by mouth at bedtime. What changed: See the new instructions.   colchicine 0.6 MG tablet Take 0.5 tablets (0.3 mg total) by mouth 2 (two) times daily.   famotidine 20 MG tablet Commonly known as: PEPCID Take 40 mg by mouth at bedtime.   Fish Oil 1200 MG Caps Take 1,200 mg by mouth in the morning.   furosemide 40 MG tablet Commonly known as: LASIX Take 1 tablet (40 mg total) by mouth daily. For 5 days then stop.   isosorbide dinitrate 20 MG tablet Commonly known as: ISORDIL Take 1 tablet (20 mg total) by mouth 3 (three) times daily.   Jardiance 25 MG Tabs tablet Generic drug: empagliflozin Take 25 mg by mouth in the morning.   metFORMIN 500 MG tablet Commonly known as: GLUCOPHAGE Take 2 tablets (1,000 mg total) by mouth 2 (two) times daily with a meal.   metoprolol tartrate 25 MG tablet Commonly known as: LOPRESSOR Take 0.5 tablets (12.5 mg total) by mouth 2  (two) times daily. What changed: how much to take   oxyCODONE 5 MG immediate release tablet Commonly known as: Oxy IR/ROXICODONE Take 1 tablet (5 mg total) by mouth every 4 (four) hours as needed for severe pain.   pioglitazone 15 MG tablet Commonly known as: ACTOS Take 15 mg by mouth in the morning.   potassium chloride SA 20 MEQ tablet Commonly known as: KLOR-CON Take 1 tablet (20 mEq total) by mouth daily. For 5 days then stop.   Vitamin D3 Maximum Strength 125 MCG (5000 UT) capsule Generic drug: Cholecalciferol Take 5,000 Units by mouth in the morning.               Durable Medical Equipment  (From admission, onward)           Start     Ordered   05/03/21 0739  For home use only DME 3 n 1  Once        05/03/21 0738   04/29/21 0841  For home use only DME 3 n 1  Once        04/29/21 0840           The patient has been discharged on:   1.Beta Blocker:  Yes [  x ]                              No   [   ]                              If No, reason:  2.Ace Inhibitor/ARB: Yes [   ]  No  [  x  ]                                     If No, reason:Labile BP;hope to start after discharge.  3.Statin:   Yes [  x ]                  No  [   ]                  If No, reason:  4.Ecasa:  Yes  [  x ]                  No   [   ]                  If No, reason:  Follow Up Appointments:  Follow-up Information     Linden Dolin, MD. Go on 05/06/2021.   Specialty: Cardiothoracic Surgery Why: Appointment time is at 2:00 pm Contact information: [address] [place] [address] [phone]         Catha Gosselin, MD. Call.   Specialty: Family Medicine Why: for a follow up appointment regarding further diabetes mangagement and surveillance of HGA1C 7.9 Contact information: [address] [place] [address] [phone]         Ronney Asters, NP. Go on 05/17/2021.   Specialty:  Cardiology Why: Appointment time is at 3:15 pm Contact information: [address] [place] [address] [phone]         [facility] Follow up.   Why: 3n1- delivered to room Contact information: [address] [place] [address] [phone]         [facility] Follow up.   Specialty: Home Health Services Why: HHPT arranged- they will contact you within 48 hr post discharge to schedule initial visit Contact information: [address] [place] [address] [phone]                 Signed: Ardelle Balls PA-C 05/03/2021, 1:52 PM

## 2021-04-29 ENCOUNTER — Inpatient Hospital Stay (HOSPITAL_COMMUNITY): Payer: BC Managed Care – PPO

## 2021-04-29 LAB — CBC
HCT: 35.1 % — ABNORMAL LOW (ref 39.0–52.0)
Hemoglobin: 11.4 g/dL — ABNORMAL LOW (ref 13.0–17.0)
MCH: 32.4 pg (ref 26.0–34.0)
MCHC: 32.5 g/dL (ref 30.0–36.0)
MCV: 99.7 fL (ref 80.0–100.0)
Platelets: 130 10*3/uL — ABNORMAL LOW (ref 150–400)
RBC: 3.52 MIL/uL — ABNORMAL LOW (ref 4.22–5.81)
RDW: 13.7 % (ref 11.5–15.5)
WBC: 7.2 10*3/uL (ref 4.0–10.5)
nRBC: 0 % (ref 0.0–0.2)

## 2021-04-29 LAB — GLUCOSE, CAPILLARY
Glucose-Capillary: 114 mg/dL — ABNORMAL HIGH (ref 70–99)
Glucose-Capillary: 127 mg/dL — ABNORMAL HIGH (ref 70–99)
Glucose-Capillary: 137 mg/dL — ABNORMAL HIGH (ref 70–99)
Glucose-Capillary: 141 mg/dL — ABNORMAL HIGH (ref 70–99)
Glucose-Capillary: 85 mg/dL (ref 70–99)
Glucose-Capillary: 90 mg/dL (ref 70–99)

## 2021-04-29 LAB — BASIC METABOLIC PANEL
Anion gap: 11 (ref 5–15)
BUN: 5 mg/dL — ABNORMAL LOW (ref 6–20)
CO2: 21 mmol/L — ABNORMAL LOW (ref 22–32)
Calcium: 7.2 mg/dL — ABNORMAL LOW (ref 8.9–10.3)
Chloride: 103 mmol/L (ref 98–111)
Creatinine, Ser: 0.64 mg/dL (ref 0.61–1.24)
GFR, Estimated: >60 mL/min (ref >60)
Glucose, Bld: 109 mg/dL — ABNORMAL HIGH (ref 70–99)
Potassium: 3.9 mmol/L (ref 3.5–5.1)
Sodium: 135 mmol/L (ref 135–145)

## 2021-04-29 MED ORDER — INSULIN ASPART 100 UNIT/ML IJ SOLN
0.0000 [IU] | INTRAMUSCULAR | Status: DC
  Administered 2021-04-29 (×3): 2 [IU] via SUBCUTANEOUS
  Administered 2021-04-30: 4 [IU] via SUBCUTANEOUS
  Administered 2021-04-30 (×4): 2 [IU] via SUBCUTANEOUS
  Administered 2021-04-30: 4 [IU] via SUBCUTANEOUS
  Administered 2021-05-01: 2 [IU] via SUBCUTANEOUS
  Administered 2021-05-01: 12 [IU] via SUBCUTANEOUS
  Administered 2021-05-01: 2 [IU] via SUBCUTANEOUS
  Administered 2021-05-01: 4 [IU] via SUBCUTANEOUS
  Administered 2021-05-01: 2 [IU] via SUBCUTANEOUS
  Administered 2021-05-02: 4 [IU] via SUBCUTANEOUS
  Administered 2021-05-02 (×2): 2 [IU] via SUBCUTANEOUS
  Administered 2021-05-03: 4 [IU] via SUBCUTANEOUS

## 2021-04-29 MED ORDER — COLCHICINE 0.3 MG HALF TABLET
0.3000 mg | ORAL_TABLET | Freq: Two times a day (BID) | ORAL | Status: DC
  Administered 2021-04-29 – 2021-05-03 (×9): 0.3 mg via ORAL
  Filled 2021-04-29 (×11): qty 1

## 2021-04-29 MED ORDER — FUROSEMIDE 10 MG/ML IJ SOLN
40.0000 mg | Freq: Two times a day (BID) | INTRAMUSCULAR | Status: DC
  Administered 2021-04-29 – 2021-04-30 (×4): 40 mg via INTRAVENOUS
  Filled 2021-04-29 (×4): qty 4

## 2021-04-29 MED ORDER — INSULIN GLARGINE 100 UNIT/ML ~~LOC~~ SOLN
15.0000 [IU] | Freq: Every day | SUBCUTANEOUS | Status: DC
  Administered 2021-04-29 – 2021-05-03 (×5): 15 [IU] via SUBCUTANEOUS
  Filled 2021-04-29 (×5): qty 0.15

## 2021-04-29 MED ORDER — ISOSORBIDE DINITRATE 10 MG PO TABS
20.0000 mg | ORAL_TABLET | Freq: Three times a day (TID) | ORAL | Status: DC
  Administered 2021-04-29 – 2021-05-03 (×12): 20 mg via ORAL
  Filled 2021-04-29 (×12): qty 2

## 2021-04-29 MED ORDER — METOPROLOL TARTRATE 25 MG PO TABS
25.0000 mg | ORAL_TABLET | Freq: Two times a day (BID) | ORAL | Status: DC
  Administered 2021-04-29: 25 mg via ORAL
  Filled 2021-04-29: qty 1

## 2021-04-29 MED ORDER — LIDOCAINE 5 % EX PTCH
1.0000 | MEDICATED_PATCH | CUTANEOUS | Status: DC
  Administered 2021-04-29 – 2021-05-02 (×4): 1 via TRANSDERMAL
  Filled 2021-04-29 (×4): qty 1

## 2021-04-29 MED ORDER — IBUPROFEN 200 MG PO TABS
400.0000 mg | ORAL_TABLET | Freq: Three times a day (TID) | ORAL | Status: DC
  Administered 2021-04-29: 400 mg via ORAL
  Filled 2021-04-29: qty 2

## 2021-04-29 MED ORDER — KETOROLAC TROMETHAMINE 15 MG/ML IJ SOLN
15.0000 mg | Freq: Four times a day (QID) | INTRAMUSCULAR | Status: AC
  Administered 2021-04-29 – 2021-04-30 (×5): 15 mg via INTRAVENOUS
  Filled 2021-04-29 (×5): qty 1

## 2021-04-29 MED ORDER — METOPROLOL TARTRATE 25 MG/10 ML ORAL SUSPENSION: 25.0000 mg | Freq: Two times a day (BID) | ORAL | Status: DC

## 2021-04-29 NOTE — Evaluation (Signed)
Occupational Therapy Evaluation Patient Details Name: Jacob Downs MRN: [id number] DOB: 12/24/1960 Today's Date: 04/29/2021    History of Present Illness 60 y.o. male presents to [facility] hospital on 04/27/2021 for planned CABG due to worsening exertional angina and history of single-vessel LAD disease. Pt underwent CABG x4 on 04/27/2021. PMH includes CAD, DM, GERD, HLD, HTN, MI, TB.   Clinical Impression   PTA, pt lives with spouse and 2 young grandchildren (70 m/o and 41 y/o). Pt presents with diagnoses above and limitations in endurance and pain. With encouragement, pt able to demo bed mobility at min guard and mobility to/from bathroom using RW at min guard. Educated on sternal precautions for ADLs with pt requiring Min A for UB/LB ADLs. [name] present and supportive throughout. Discussed energy conservation strategies, use of BSC as shower chair if still limited by endurance at home. Will continue to follow acutely with recommendation of HHOT follow-up at DC. VSS on 2 L O2.     Follow Up Recommendations  Home health OT    Equipment Recommendations  3 in 1 bedside commode;Other (comment) (Rolling walker)    Recommendations for Other Services       Precautions / Restrictions Precautions Precautions: Fall;Sternal Precaution Booklet Issued: Yes (comment) Precaution Comments: 2 chest tubes, wound vac Restrictions Weight Bearing Restrictions: No      Mobility Bed Mobility Overal bed mobility: Needs Assistance Bed Mobility: Sit to Supine       Sit to supine: Min guard;HOB elevated   General bed mobility comments: min guard for safety, minor cueing for precautions    Transfers Overall transfer level: Needs assistance   Transfers: Sit to/from Stand;Stand Pivot Transfers Sit to Stand: Min guard Stand pivot transfers: Min guard       General transfer comment: min guard for safety, no LOB and able to return demo correct hand placement    Balance Overall balance assessment:  Needs assistance Sitting-balance support: No upper extremity supported;Feet supported Sitting balance-Leahy Scale: Fair Sitting balance - Comments: EOB without UE support   Standing balance support: Bilateral upper extremity supported Standing balance-Leahy Scale: Fair Standing balance comment: fair static standing without support                           ADL either performed or assessed with clinical judgement   ADL Overall ADL's : Needs assistance/impaired Eating/Feeding: Independent;Sitting   Grooming: Supervision/safety;Standing   Upper Body Bathing: Cueing for compensatory techniques;Sitting;Minimal assistance   Lower Body Bathing: Supervison/ safety;Sit to/from stand   Upper Body Dressing : Minimal assistance;Sitting   Lower Body Dressing: Minimal assistance;Sit to/from stand Lower Body Dressing Details (indicate cue type and reason): some difficulty forming figure four position sitting EOB to reach feet Toilet Transfer: Min guard;Ambulation;RW Toilet Transfer Details (indicate cue type and reason): BSC over toilet, good maintenance of sternal precautions Toileting- Clothing Manipulation and Hygiene: Supervision/safety;Sit to/from stand       Functional mobility during ADLs: Min guard;Rolling walker;Cueing for sequencing General ADL Comments: Educated pt/wife on compensatory strategies for ADLs to maintain sternal precautions, discussed shower chair use if endurance-limited on return home     Vision Patient Visual Report: No change from baseline Vision Assessment?: No apparent visual deficits     Perception     Praxis      Pertinent Vitals/Pain Pain Assessment: 0-10 Pain Score: 6  Faces Pain Scale: Hurts whole lot Pain Location: chest Pain Descriptors / Indicators: Aching;Guarding Pain Intervention(s): Monitored  during session;Other (comment) (RN aware)     Hand Dominance Right   Extremity/Trunk Assessment Upper Extremity Assessment Upper  Extremity Assessment: Overall WFL for tasks assessed   Lower Extremity Assessment Lower Extremity Assessment: Defer to PT evaluation   Cervical / Trunk Assessment Cervical / Trunk Assessment: Other exceptions;Normal Cervical / Trunk Exceptions: chest tubes, sternal wound vac   Communication Communication Communication: No difficulties   Cognition Arousal/Alertness: Awake/alert Behavior During Therapy: WFL for tasks assessed/performed Overall Cognitive Status: Impaired/Different from baseline Area of Impairment: Memory                     Memory: Decreased recall of precautions             General Comments  VSS on 2 L O2    Exercises General Exercises - Lower Extremity Long Arc Quad: AROM;Both;Seated;10 reps Hip Flexion/Marching: AROM;Both;Seated;10 reps   Shoulder Instructions      Home Living Family/patient expects to be discharged to:: Private residence Living Arrangements: Spouse/significant other Available Help at Discharge: Family;Available 24 hours/day Type of Home: House Home Access: Stairs to enter [employer] of Steps: 1 Entrance Stairs-Rails: None Home Layout: Two level;Other (Comment) (half bath downstairs) Alternate Level Stairs-Number of Steps: 15 Alternate Level Stairs-Rails: Right Bathroom Shower/Tub: Producer, television/film/video: Standard     Home Equipment: Environmental consultant - 2 wheels;Crutches;Bedside commode   Additional Comments: wife may have RW and BSC from her previous surgeries      Prior Functioning/Environment Level of Independence: Independent        Comments: works for a [employer] Problem List: Decreased strength;Decreased activity tolerance;Impaired balance (sitting and/or standing);Decreased knowledge of precautions;Decreased knowledge of use of DME or AE;Cardiopulmonary status limiting activity;Pain      OT Treatment/Interventions: Self-care/ADL training;Therapeutic exercise;Energy  conservation;DME and/or AE instruction;Therapeutic activities;Patient/family education;Balance training    OT Goals(Current goals can be found in the care plan section) Acute Rehab OT Goals Patient Stated Goal: to return to independence and reduce pain OT Goal Formulation: With patient/family Time For Goal Achievement: 05/13/21 Potential to Achieve Goals: Good ADL Goals Pt Will Perform Grooming: with modified independence;standing Pt Will Perform Upper Body Bathing: with modified independence;sitting Pt Will Perform Lower Body Dressing: with modified independence;sit to/from stand Pt Will Transfer to Toilet: with modified independence;ambulating  OT Frequency: Min 2X/week   Barriers to D/C:            Co-evaluation              AM-PAC OT "6 Clicks" Daily Activity     Outcome Measure Help from another person eating meals?: None Help from another person taking care of personal grooming?: A Little Help from another person toileting, which includes using toliet, bedpan, or urinal?: A Little Help from another person bathing (including washing, rinsing, drying)?: A Little Help from another person to put on and taking off regular upper body clothing?: A Little Help from another person to put on and taking off regular lower body clothing?: A Little 6 Click Score: 19   End of Session Equipment Utilized During Treatment: Gait belt;Rolling walker;Oxygen Nurse Communication: Mobility status  Activity Tolerance: Patient tolerated treatment well Patient left: Other (comment) (with RN for mobility in hall)  OT Visit Diagnosis: Unsteadiness on feet (R26.81);Pain Pain - part of body:  (chest)                Time: 1610-9604 OT Time Calculation (  min): 22 min Charges:  OT General Charges $OT Visit: 1 Visit OT Evaluation $OT Eval Moderate Complexity: 1 Mod  Bradd Canary, OTR/L Acute Rehab Services Office: [phone]   Lorre Munroe 04/29/2021, 12:40 PM

## 2021-04-29 NOTE — Progress Notes (Signed)
Physical Therapy Treatment Patient Details Name: Jacob Downs MRN: [id number] DOB: [birth year] Today's Date: 04/29/2021    History of Present Illness 60 y.o. male presents to [facility] hospital on 04/27/2021 for planned CABG due to worsening exertional angina and history of single-vessel LAD disease. Pt underwent CABG x4 on 04/27/2021. PMH includes CAD, DM, GERD, HLD, HTN, MI, TB.    PT Comments    Pt apprehensive regarding mobility but educated for all precautions, transfers and progression. Pt with significant progression able to walk in hall this session but needs mod cues to breathe as pt with tendency to hold his breath. Pt provided handout for precautions and educated for HEP. Encouraged continued mobility and walking program.   HR 91-104 SpO2 98% on 2L Pre gait 152/80 (99) Post gait 186/99 (121)   Follow Up Recommendations  Home health PT;Supervision for mobility/OOB     Equipment Recommendations  3in1 (PT)    Recommendations for Other Services       Precautions / Restrictions Precautions Precautions: Fall;Sternal Precaution Booklet Issued: Yes (comment) Precaution Comments: chest tube x4, wound vac Restrictions Weight Bearing Restrictions: No    Mobility  Bed Mobility Overal bed mobility: Needs Assistance Bed Mobility: Sit to Supine       Sit to supine: Mod assist   General bed mobility comments: cues for sequence with physical assist to lift legs to surface    Transfers Overall transfer level: Needs assistance   Transfers: Sit to/from Stand Sit to Stand: Min assist;+2 safety/equipment         General transfer comment: cues for sequence and hand placement with assist for rise from surface  Ambulation/Gait Ambulation/Gait assistance: Min assist Gait Distance (Feet): 200 Feet Assistive device: Rolling walker (2 wheeled) Gait Pattern/deviations: Step-through pattern;Decreased stride length   Gait velocity interpretation: 1.31 - 2.62 ft/sec, indicative of  limited community ambulator General Gait Details: cues for breathing as pt with tendency to hold his breath, posture and direction. Decreased speed with assist for line management and chair follow but not needed next session   Stairs             Wheelchair Mobility    Modified Rankin (Stroke Patients Only)       Balance Overall balance assessment: Needs assistance   Sitting balance-Leahy Scale: Fair Sitting balance - Comments: EOB without UE support   Standing balance support: Bilateral upper extremity supported Standing balance-Leahy Scale: Poor Standing balance comment: RW in standing and for gait                            Cognition Arousal/Alertness: Awake/alert Behavior During Therapy: WFL for tasks assessed/performed Overall Cognitive Status: Impaired/Different from baseline Area of Impairment: Memory                     Memory: Decreased recall of precautions                Exercises General Exercises - Lower Extremity Long Arc Quad: AROM;Both;Seated;10 reps Hip Flexion/Marching: AROM;Both;Seated;10 reps    General Comments        Pertinent Vitals/Pain Faces Pain Scale: Hurts whole lot Pain Location: chest Pain Descriptors / Indicators: Aching;Guarding Pain Intervention(s): Limited activity within patient's tolerance;Monitored during session;Premedicated before session;Repositioned    Home Living Family/patient expects to be discharged to:: Private residence Living Arrangements: Spouse/significant other  Prior Function            PT Goals (current goals can now be found in the care plan section) Progress towards PT goals: Progressing toward goals    Frequency    Min 3X/week      PT Plan Current plan remains appropriate    Co-evaluation              AM-PAC PT "6 Clicks" Mobility   Outcome Measure  Help needed turning from your back to your side while in a flat bed without using  bedrails?: A Little Help needed moving from lying on your back to sitting on the side of a flat bed without using bedrails?: A Little Help needed moving to and from a bed to a chair (including a wheelchair)?: A Little Help needed standing up from a chair using your arms (e.g., wheelchair or bedside chair)?: A Little Help needed to walk in hospital room?: A Little Help needed climbing 3-5 steps with a railing? : A Lot 6 Click Score: 17    End of Session Equipment Utilized During Treatment: Gait belt;Oxygen Activity Tolerance: Patient tolerated treatment well Patient left: in bed;with call bell/phone within reach;with bed alarm set Nurse Communication: Mobility status PT Visit Diagnosis: Unsteadiness on feet (R26.81);Other abnormalities of gait and mobility (R26.89);Muscle weakness (generalized) (M62.81);Pain     Time: 0998-3382 PT Time Calculation (min) (ACUTE ONLY): 21 min  Charges:  $Gait Training: 8-22 mins                     Jacob Downs, PT Acute Rehabilitation Services Pager: [phone] Office: [phone]    Jacob Downs 04/29/2021, 9:51 AM

## 2021-04-30 ENCOUNTER — Inpatient Hospital Stay (HOSPITAL_COMMUNITY): Payer: BC Managed Care – PPO

## 2021-04-30 ENCOUNTER — Encounter (HOSPITAL_COMMUNITY): Payer: Self-pay | Admitting: Cardiothoracic Surgery

## 2021-04-30 LAB — GLUCOSE, CAPILLARY
Glucose-Capillary: 121 mg/dL — ABNORMAL HIGH (ref 70–99)
Glucose-Capillary: 122 mg/dL — ABNORMAL HIGH (ref 70–99)
Glucose-Capillary: 124 mg/dL — ABNORMAL HIGH (ref 70–99)
Glucose-Capillary: 153 mg/dL — ABNORMAL HIGH (ref 70–99)
Glucose-Capillary: 159 mg/dL — ABNORMAL HIGH (ref 70–99)
Glucose-Capillary: 164 mg/dL — ABNORMAL HIGH (ref 70–99)
Glucose-Capillary: 164 mg/dL — ABNORMAL HIGH (ref 70–99)

## 2021-04-30 LAB — BASIC METABOLIC PANEL
Anion gap: 9 (ref 5–15)
BUN: 7 mg/dL (ref 6–20)
CO2: 27 mmol/L (ref 22–32)
Calcium: 8.3 mg/dL — ABNORMAL LOW (ref 8.9–10.3)
Chloride: 100 mmol/L (ref 98–111)
Creatinine, Ser: 0.77 mg/dL (ref 0.61–1.24)
GFR, Estimated: >60 mL/min (ref >60)
Glucose, Bld: 131 mg/dL — ABNORMAL HIGH (ref 70–99)
Potassium: 4.2 mmol/L (ref 3.5–5.1)
Sodium: 136 mmol/L (ref 135–145)

## 2021-04-30 LAB — CBC
HCT: 34.3 % — ABNORMAL LOW (ref 39.0–52.0)
Hemoglobin: 11.5 g/dL — ABNORMAL LOW (ref 13.0–17.0)
MCH: 33.3 pg (ref 26.0–34.0)
MCHC: 33.5 g/dL (ref 30.0–36.0)
MCV: 99.4 fL (ref 80.0–100.0)
Platelets: 139 10*3/uL — ABNORMAL LOW (ref 150–400)
RBC: 3.45 MIL/uL — ABNORMAL LOW (ref 4.22–5.81)
RDW: 13.8 % (ref 11.5–15.5)
WBC: 6.1 10*3/uL (ref 4.0–10.5)
nRBC: 0 % (ref 0.0–0.2)

## 2021-04-30 MED ORDER — ATORVASTATIN CALCIUM 80 MG PO TABS: 80.0000 mg | ORAL_TABLET | Freq: Every day | ORAL | Status: DC

## 2021-04-30 MED ORDER — AMLODIPINE BESYLATE 5 MG PO TABS
5.0000 mg | ORAL_TABLET | Freq: Every day | ORAL | Status: DC
  Administered 2021-04-30 – 2021-05-03 (×4): 5 mg via ORAL
  Filled 2021-04-30 (×4): qty 1

## 2021-04-30 MED ORDER — METOPROLOL TARTRATE 25 MG PO TABS
25.0000 mg | ORAL_TABLET | Freq: Three times a day (TID) | ORAL | Status: DC
  Administered 2021-04-30 – 2021-05-01 (×6): 25 mg via ORAL
  Filled 2021-04-30 (×6): qty 1

## 2021-04-30 MED ORDER — METOPROLOL TARTRATE 25 MG/10 ML ORAL SUSPENSION
25.0000 mg | Freq: Three times a day (TID) | ORAL | Status: DC
  Filled 2021-04-30 (×6): qty 10

## 2021-04-30 MED FILL — Albumin, Human Inj 5%: INTRAVENOUS | Qty: 250 | Status: AC

## 2021-04-30 MED FILL — Electrolyte-R (PH 7.4) Solution: INTRAVENOUS | Qty: 5000 | Status: AC

## 2021-04-30 MED FILL — Mannitol IV Soln 20%: INTRAVENOUS | Qty: 500 | Status: AC

## 2021-04-30 MED FILL — Heparin Sodium (Porcine) Inj 1000 Unit/ML: INTRAMUSCULAR | Qty: 10 | Status: AC

## 2021-04-30 MED FILL — Sodium Bicarbonate IV Soln 8.4%: INTRAVENOUS | Qty: 50 | Status: AC

## 2021-04-30 MED FILL — Potassium Chloride Inj 2 mEq/ML: INTRAVENOUS | Qty: 40 | Status: AC

## 2021-04-30 MED FILL — Sodium Chloride IV Soln 0.9%: INTRAVENOUS | Qty: 4000 | Status: AC

## 2021-04-30 MED FILL — Magnesium Sulfate Inj 50%: INTRAMUSCULAR | Qty: 10 | Status: AC

## 2021-04-30 MED FILL — Calcium Chloride Inj 10%: INTRAVENOUS | Qty: 10 | Status: AC

## 2021-04-30 MED FILL — Heparin Sodium (Porcine) Inj 1000 Unit/ML: INTRAMUSCULAR | Qty: 30 | Status: AC

## 2021-04-30 NOTE — Progress Notes (Signed)
3 Days Post-Op Procedure(s) (LRB): CORONARY ARTERY BYPASS GRAFTING (CABG) X FOUR,  USING LEFT INTERNAL MAMMARY ARTERY, RIGHT INTERNAL MAMMARY ARTERY, RIGHT RADIAL ARTERY CONDUITS (N/A) possible RADIAL ARTERY HARVEST (Right) TRANSESOPHAGEAL ECHOCARDIOGRAM (TEE) (N/A) INDOCYANINE GREEN FLUORESCENCE IMAGING (ICG) (N/A) Subjective: Feeling better  Objective: Vital signs in last 24 hours: Temp:  [97.8 F (36.6 C)-98 F (36.7 C)] 97.8 F (36.6 C) (06/09 2343) Pulse Rate:  [71-102] 79 (06/10 0700) Cardiac Rhythm: Normal sinus rhythm (06/09 2000) Resp:  [15-27] 24 (06/10 0700) BP: (104-170)/(62-83) 125/73 (06/10 0700) SpO2:  [89 %-98 %] 89 % (06/10 0700) Weight:  [115.4 kg] 115.4 kg (06/10 0600)  Hemodynamic parameters for last 24 hours:    Intake/Output from previous day: 06/09 0701 - 06/10 0700 In: 687.9 [P.O.:120; I.V.:467.8; IV Piggyback:100.1] Out: 3330 [Urine:2800; Chest Tube:530] Intake/Output this shift: No intake/output data recorded.  General appearance: alert and cooperative Neurologic: intact Heart: regular rate and rhythm, S1, S2 normal, no murmur, click, rub or gallop Lungs: clear to auscultation bilaterally Abdomen: soft, non-tender; bowel sounds normal; no masses,  no organomegaly Extremities: edema 2+ Wound: c/d/i  Lab Results: Recent Labs    04/29/21 0329 04/30/21 0430  WBC 7.2 6.1  HGB 11.4* 11.5*  HCT 35.1* 34.3*  PLT 130* 139*   BMET:  Recent Labs    04/29/21 0329 04/30/21 0430  NA 135 136  K 3.9 4.2  CL 103 100  CO2 21* 27  GLUCOSE 109* 131*  BUN 5* 7  CREATININE 0.64 0.77  CALCIUM 7.2* 8.3*    PT/INR:  Recent Labs    04/27/21 1344  LABPROT 16.9*  INR 1.4*   ABG    Component Value Date/Time   PHART 7.285 (L) 04/27/2021 1830   HCO3 24.7 04/27/2021 1830   TCO2 26 04/27/2021 1830   ACIDBASEDEF 2.0 04/27/2021 1830   O2SAT 97.0 04/27/2021 1830   CBG (last 3)  Recent Labs    04/29/21 1954 04/29/21 2341 04/30/21 0314  GLUCAP  137* 121* 124*    Assessment/Plan: S/P Procedure(s) (LRB): CORONARY ARTERY BYPASS GRAFTING (CABG) X FOUR,  USING LEFT INTERNAL MAMMARY ARTERY, RIGHT INTERNAL MAMMARY ARTERY, RIGHT RADIAL ARTERY CONDUITS (N/A) possible RADIAL ARTERY HARVEST (Right) TRANSESOPHAGEAL ECHOCARDIOGRAM (TEE) (N/A) INDOCYANINE GREEN FLUORESCENCE IMAGING (ICG) (N/A) Mobilize Diuresis Plan for transfer to step-down: see transfer orders Increase anti-hypertensives   LOS: 3 days    Jacob Downs 04/30/2021

## 2021-04-30 NOTE — Progress Notes (Signed)
Mobility Specialist: Progress Note   04/30/21 1824  Mobility  Activity Refused mobility   Pt refused mobility stating he was told he didn't have to walk again today d/t chest tubes being pulled. Explained to pt that he can walk and his goal is to walk 3x per Kylii Ennis. Will f/u tomorrow.   [facility] Jase Reep Mobility Specialist Mobility Specialist Phone: [phone]

## 2021-04-30 NOTE — Progress Notes (Signed)
Physical Therapy Treatment Patient Details Name: Jacob Downs MRN: [id number] DOB: [birth year] Today's Date: 04/30/2021    History of Present Illness 60 y.o. male presents to [facility] hospital on 04/27/2021 for planned CABG due to worsening exertional angina and history of single-vessel LAD disease. Pt underwent CABG x4 on 04/27/2021. PMH includes CAD, DM, GERD, HLD, HTN, MI, TB.    PT Comments    Pt pleasant in chair on arrival and able to increase ambulation distance with improved ambulation. Pt unable to recall sternal precautions and educated for all as well as HEP and need for continued mobility and progression to stairs prior to D/C.   HR 84-104 88-92% on RA with gait Pre gait 125/73 (89) , post 1235/80 (97)   Follow Up Recommendations  Home health PT;Supervision for mobility/OOB     Equipment Recommendations  3in1 (PT)    Recommendations for Other Services       Precautions / Restrictions Precautions Precautions: Fall;Sternal Precaution Comments: 2 chest tubes, wound vac    Mobility  Bed Mobility Overal bed mobility: Needs Assistance Bed Mobility: Sit to Supine       Sit to supine: Min assist   General bed mobility comments: min assist to lift legs to surface with cues for precautions    Transfers Overall transfer level: Needs assistance   Transfers: Sit to/from Stand Sit to Stand: Min guard         General transfer comment: cues for hand placement from chair and to bed  Ambulation/Gait Ambulation/Gait assistance: Min guard Gait Distance (Feet): 400 Feet Assistive device: Rolling walker (2 wheeled) Gait Pattern/deviations: Step-through pattern;Decreased stride length   Gait velocity interpretation: 1.31 - 2.62 ft/sec, indicative of limited community ambulator General Gait Details: pt with cues for posture and precautions, improved breathing pattern and ambulation distance today   Stairs             Wheelchair Mobility    Modified Rankin  (Stroke Patients Only)       Balance Overall balance assessment: Needs assistance   Sitting balance-Leahy Scale: Good Sitting balance - Comments: EOB without UE support   Standing balance support: Bilateral upper extremity supported Standing balance-Leahy Scale: Poor Standing balance comment: RW for gait                            Cognition Arousal/Alertness: Awake/alert Behavior During Therapy: WFL for tasks assessed/performed Overall Cognitive Status: Impaired/Different from baseline Area of Impairment: Memory                     Memory: Decreased recall of precautions                Exercises General Exercises - Lower Extremity Long Arc Quad: AROM;Both;Seated;15 reps Hip Flexion/Marching: AROM;Both;Seated;15 reps    General Comments        Pertinent Vitals/Pain Pain Score: 5  Pain Location: chest Pain Descriptors / Indicators: Aching;Guarding Pain Intervention(s): Limited activity within patient's tolerance;Monitored during session;Premedicated before session;Repositioned    Home Living                      Prior Function            PT Goals (current goals can now be found in the care plan section) Progress towards PT goals: Progressing toward goals    Frequency           PT Plan Current plan remains  appropriate    Co-evaluation              AM-PAC PT "6 Clicks" Mobility   Outcome Measure  Help needed turning from your back to your side while in a flat bed without using bedrails?: A Little Help needed moving from lying on your back to sitting on the side of a flat bed without using bedrails?: A Little Help needed moving to and from a bed to a chair (including a wheelchair)?: A Little Help needed standing up from a chair using your arms (e.g., wheelchair or bedside chair)?: A Little Help needed to walk in hospital room?: A Little Help needed climbing 3-5 steps with a railing? : A Lot 6 Click Score: 17     End of Session Equipment Utilized During Treatment: Gait belt Activity Tolerance: Patient tolerated treatment well Patient left: in bed;with call bell/phone within reach Nurse Communication: Mobility status PT Visit Diagnosis: Unsteadiness on feet (R26.81);Other abnormalities of gait and mobility (R26.89);Muscle weakness (generalized) (M62.81);Pain     Time: 9629-5284 PT Time Calculation (min) (ACUTE ONLY): 19 min  Charges:  $Gait Training: 8-22 mins                     [name], PT Acute Rehabilitation Services Pager: [phone] Office: [phone]    [name] 04/30/2021, 8:58 AM

## 2021-05-01 LAB — BASIC METABOLIC PANEL
Anion gap: 7 (ref 5–15)
BUN: 11 mg/dL (ref 6–20)
CO2: 31 mmol/L (ref 22–32)
Calcium: 8.2 mg/dL — ABNORMAL LOW (ref 8.9–10.3)
Chloride: 100 mmol/L (ref 98–111)
Creatinine, Ser: 0.69 mg/dL (ref 0.61–1.24)
GFR, Estimated: >60 mL/min (ref >60)
Glucose, Bld: 147 mg/dL — ABNORMAL HIGH (ref 70–99)
Potassium: 3.8 mmol/L (ref 3.5–5.1)
Sodium: 138 mmol/L (ref 135–145)

## 2021-05-01 LAB — GLUCOSE, CAPILLARY
Glucose-Capillary: 123 mg/dL — ABNORMAL HIGH (ref 70–99)
Glucose-Capillary: 130 mg/dL — ABNORMAL HIGH (ref 70–99)
Glucose-Capillary: 152 mg/dL — ABNORMAL HIGH (ref 70–99)
Glucose-Capillary: 188 mg/dL — ABNORMAL HIGH (ref 70–99)
Glucose-Capillary: 255 mg/dL — ABNORMAL HIGH (ref 70–99)
Glucose-Capillary: 84 mg/dL (ref 70–99)

## 2021-05-01 MED ORDER — FUROSEMIDE 40 MG PO TABS
40.0000 mg | ORAL_TABLET | Freq: Every day | ORAL | Status: DC
  Administered 2021-05-01 – 2021-05-03 (×3): 40 mg via ORAL
  Filled 2021-05-01 (×3): qty 1

## 2021-05-01 MED ORDER — AMIODARONE HCL 200 MG PO TABS
400.0000 mg | ORAL_TABLET | Freq: Two times a day (BID) | ORAL | Status: DC
  Administered 2021-05-01 – 2021-05-03 (×5): 400 mg via ORAL
  Filled 2021-05-01 (×5): qty 2

## 2021-05-01 MED ORDER — AMIODARONE IV BOLUS ONLY 150 MG/100ML
150.0000 mg | Freq: Once | INTRAVENOUS | Status: AC
  Administered 2021-05-01: 150 mg via INTRAVENOUS
  Filled 2021-05-01: qty 100

## 2021-05-01 MED ORDER — POTASSIUM CHLORIDE CRYS ER 20 MEQ PO TBCR
20.0000 meq | EXTENDED_RELEASE_TABLET | Freq: Every day | ORAL | Status: DC
  Administered 2021-05-01: 20 meq via ORAL
  Filled 2021-05-01: qty 1

## 2021-05-01 MED ORDER — METFORMIN HCL 500 MG PO TABS
1000.0000 mg | ORAL_TABLET | Freq: Two times a day (BID) | ORAL | Status: DC
  Administered 2021-05-01 – 2021-05-03 (×4): 1000 mg via ORAL
  Filled 2021-05-01 (×4): qty 2

## 2021-05-01 NOTE — Plan of Care (Signed)

## 2021-05-01 NOTE — Progress Notes (Signed)
CARDIAC REHAB PHASE I   Went to offer to ambulate with pt. Pt noted to be in Afib rate 90s-120s, not in previously except for a short burst last night. RN and PA made aware, new orders noted. Will let pt receive medication. Encouraged ambulation as HR allows. D/c education held. Will continue to follow.  [id number] Reynold Bowen, RN BSN 05/01/2021 11:58 AM

## 2021-05-01 NOTE — Progress Notes (Addendum)
[address]       [place] [address]             [phone]      4 Days Post-Op Procedure(s) (LRB): CORONARY ARTERY BYPASS GRAFTING (CABG) X FOUR,  USING LEFT INTERNAL MAMMARY ARTERY, RIGHT INTERNAL MAMMARY ARTERY, RIGHT RADIAL ARTERY CONDUITS (N/A) possible RADIAL ARTERY HARVEST (Right) TRANSESOPHAGEAL ECHOCARDIOGRAM (TEE) (N/A) INDOCYANINE GREEN FLUORESCENCE IMAGING (ICG) (N/A) Subjective: Feels generally well  Objective: Vital signs in last 24 hours: Temp:  [97.6 F (36.4 C)-98.5 F (36.9 C)] 97.9 F (36.6 C) (06/11 0452) Pulse Rate:  [69-84] 83 (06/11 0623) Cardiac Rhythm: Normal sinus rhythm (06/10 1900) Resp:  [15-22] 18 (06/11 0623) BP: (109-122)/(59-78) 118/71 (06/11 0452) SpO2:  [90 %-99 %] 99 % (06/11 0623) Weight:  [115.7 kg] 115.7 kg (06/11 0623)  Hemodynamic parameters for last 24 hours:    Intake/Output from previous day: 06/10 0701 - 06/11 0700 In: 240 [P.O.:240] Out: 1880 [Urine:1750; Chest Tube:130] Intake/Output this shift: No intake/output data recorded.  General appearance: alert, cooperative, and no distress Heart: regular rate and rhythm Lungs: dim in bases Abdomen: benign Extremities: no edema Wound: incis healing well, right hand N/V itact  Lab Results: Recent Labs    04/29/21 0329 04/30/21 0430  WBC 7.2 6.1  HGB 11.4* 11.5*  HCT 35.1* 34.3*  PLT 130* 139*   BMET:  Recent Labs    04/30/21 0430 05/01/21 0042  NA 136 138  K 4.2 3.8  CL 100 100  CO2 27 31  GLUCOSE 131* 147*  BUN 7 11  CREATININE 0.77 0.69  CALCIUM 8.3* 8.2*    PT/INR: No results for input(s): LABPROT, INR in the last 72 hours. ABG    Component Value Date/Time   PHART 7.285 (L) 04/27/2021 1830   HCO3 24.7 04/27/2021 1830   TCO2 26 04/27/2021 1830   ACIDBASEDEF 2.0 04/27/2021 1830   O2SAT 97.0 04/27/2021 1830   CBG (last 3)  Recent Labs    04/30/21 2047 04/30/21 2355 05/01/21 0456  GLUCAP 164* 122* 130*     Meds Scheduled Meds:  acetaminophen  1,000 mg Oral Q6H   Or   acetaminophen (TYLENOL) oral liquid 160 mg/5 mL  1,000 mg Per Tube Q6H   amLODipine  5 mg Oral Daily   aspirin EC  325 mg Oral Daily   Or   aspirin  324 mg Per Tube Daily   atorvastatin  80 mg Oral QHS   bisacodyl  10 mg Oral Daily   Or   bisacodyl  10 mg Rectal Daily   Chlorhexidine Gluconate Cloth  6 each Topical Daily   colchicine  0.3 mg Oral BID   docusate sodium  200 mg Oral Daily   furosemide  40 mg Intravenous BID   insulin aspart  0-24 Units Subcutaneous Q4H   insulin glargine  15 Units Subcutaneous Daily   isosorbide dinitrate  20 mg Oral TID   lidocaine  1 patch Transdermal Q24H   melatonin  3 mg Oral QHS   metoprolol tartrate  25 mg Oral TID   Or   metoprolol tartrate  25 mg Per Tube TID   pantoprazole  40 mg Oral Daily   Continuous Infusions:  sodium chloride     sodium chloride 10 mL/hr at 04/27/21 1355   insulin Stopped (04/29/21 1132)   lactated ringers     lactated ringers Stopped (04/30/21 0616)   PRN Meds:.dextrose, lactated ringers, metoprolol tartrate, morphine  injection, ondansetron (ZOFRAN) IV, oxyCODONE, sodium chloride flush, sodium chloride flush, traMADol  Xrays DG Chest Port 1 View  Result Date: 04/30/2021 CLINICAL DATA:  Chest pain EXAM: PORTABLE CHEST 1 VIEW COMPARISON:  April 29, 2021 FINDINGS: Cordis tip is in the superior vena cava. There is a chest tube on each side. There is also a mediastinal drain. No appreciable pneumothorax. There is a small left pleural effusion. There is atelectatic change in the left mid lung and medial bibasilar regions. No new opacity evident. Heart is mildly enlarged with pulmonary vascularity stable and within normal limits. Status post median sternotomy. No bone lesions. IMPRESSION: Tube and catheter positions as described. No evident pneumothorax. Small left pleural effusion. Atelectasis left mid lung and bibasilar regions. Electronically Signed    By: Bretta Bang III M.D.   On: 04/30/2021 08:04    Assessment/Plan: S/P Procedure(s) (LRB): CORONARY ARTERY BYPASS GRAFTING (CABG) X FOUR,  USING LEFT INTERNAL MAMMARY ARTERY, RIGHT INTERNAL MAMMARY ARTERY, RIGHT RADIAL ARTERY CONDUITS (N/A) possible RADIAL ARTERY HARVEST (Right) TRANSESOPHAGEAL ECHOCARDIOGRAM (TEE) (N/A) INDOCYANINE GREEN FLUORESCENCE IMAGING (ICG) (N/A)   1 afeb, VSS, mostly sinus , one short burst of afib- monitor closely- cont metoprolol 2 sats good on RA but was placed back on O2 last night for high 80's- try to wean off completely today 3 CT 130 cc- CT has been D/C'd 4 good UOP, normal renal fxn, will decrease lasix  5 BS adeq cobtrol 6 BS adeq controlled- resume metformin and gradually transition to home meds- poor control at home so will need close primary f/u for DM 7 prob home in am if rhythm remains stable and no new issues    LOS: 4 days    Rowe Clack PA-C Pager [phone] 05/01/2021   Patient seen and examined, agree with assessment and plan outlined above   Viviann Spare C. Dorris Fetch, MD [facility] [phone]

## 2021-05-01 NOTE — Progress Notes (Signed)
Mobility Specialist: Progress Note   05/01/21 1520  Mobility  Activity Ambulated in hall  Level of Assistance Minimal assist, patient does 75% or more  Assistive Device Front wheel walker  Distance Ambulated (ft) 470 ft  Mobility Ambulated with assistance in hallway  Mobility Response Tolerated well  Mobility performed by Mobility specialist  Bed Position Chair  $Mobility charge 1 Mobility   Pre-Mobility: 95 HR, 93% SpO2 During Mobility: 124 HR, 97% SpO2 Post-Mobility: 92 HR, 117/69 BP, 97% SpO2  Pt required minA to stand, took 2 attempts. First attempt to stand pt was independent with verbal cues and second attempt was minA to power up. Pt independent after standing and asx throughout ambulation. Pt to recliner after walk with call bell in reach.   [facility] Jacob Downs Mobility Specialist Mobility Specialist Phone: [phone]

## 2021-05-01 NOTE — Progress Notes (Signed)
Mobility Specialist: Progress Note   05/01/21 1753  Mobility  Activity Ambulated in hall  Level of Assistance Modified independent, requires aide device or extra time  Assistive Device Front wheel walker  Distance Ambulated (ft) 470 ft  Mobility Ambulated independently in hallway  Mobility Response Tolerated well  Mobility performed by Mobility specialist  $Mobility charge 1 Mobility   Pre-Mobility: 87 HR During Mobility: 90 HR Post-Mobility: 78 HR  Pt asx during ambulation. Pt required minA to stand initially but did not follow sternal precautions. After returning to room pt was coached through rocking forward with his hands on his knees. Pt was successful with standing x4 independently. Pt back to bed after walk with call bell at his side and pt's wife present in the room.   [facility] [name] Mobility Specialist Mobility Specialist Phone: [phone]

## 2021-05-02 LAB — BASIC METABOLIC PANEL
Anion gap: 6 (ref 5–15)
BUN: 9 mg/dL (ref 6–20)
CO2: 35 mmol/L — ABNORMAL HIGH (ref 22–32)
Calcium: 8.3 mg/dL — ABNORMAL LOW (ref 8.9–10.3)
Chloride: 99 mmol/L (ref 98–111)
Creatinine, Ser: 0.6 mg/dL — ABNORMAL LOW (ref 0.61–1.24)
GFR, Estimated: >60 mL/min (ref >60)
Glucose, Bld: 126 mg/dL — ABNORMAL HIGH (ref 70–99)
Potassium: 3.4 mmol/L — ABNORMAL LOW (ref 3.5–5.1)
Sodium: 140 mmol/L (ref 135–145)

## 2021-05-02 LAB — GLUCOSE, CAPILLARY
Glucose-Capillary: 122 mg/dL — ABNORMAL HIGH (ref 70–99)
Glucose-Capillary: 125 mg/dL — ABNORMAL HIGH (ref 70–99)
Glucose-Capillary: 141 mg/dL — ABNORMAL HIGH (ref 70–99)
Glucose-Capillary: 154 mg/dL — ABNORMAL HIGH (ref 70–99)
Glucose-Capillary: 92 mg/dL (ref 70–99)
Glucose-Capillary: 113 mg/dL — ABNORMAL HIGH (ref 70–99)
Glucose-Capillary: 129 mg/dL — ABNORMAL HIGH (ref 70–99)
Glucose-Capillary: 162 mg/dL — ABNORMAL HIGH (ref 70–99)
Glucose-Capillary: 123 mg/dL — ABNORMAL HIGH (ref 70–99)
Glucose-Capillary: 98 mg/dL (ref 70–99)

## 2021-05-02 MED ORDER — PIOGLITAZONE HCL 15 MG PO TABS
15.0000 mg | ORAL_TABLET | Freq: Every morning | ORAL | Status: DC
  Administered 2021-05-02 – 2021-05-03 (×2): 15 mg via ORAL
  Filled 2021-05-02 (×2): qty 1

## 2021-05-02 MED ORDER — METOPROLOL TARTRATE 25 MG/10 ML ORAL SUSPENSION
12.5000 mg | Freq: Two times a day (BID) | ORAL | Status: DC
  Filled 2021-05-02 (×4): qty 5

## 2021-05-02 MED ORDER — POTASSIUM CHLORIDE CRYS ER 20 MEQ PO TBCR
40.0000 meq | EXTENDED_RELEASE_TABLET | Freq: Two times a day (BID) | ORAL | Status: AC
  Administered 2021-05-02 (×2): 40 meq via ORAL
  Filled 2021-05-02 (×2): qty 2

## 2021-05-02 MED ORDER — METOPROLOL TARTRATE 12.5 MG HALF TABLET
12.5000 mg | ORAL_TABLET | Freq: Two times a day (BID) | ORAL | Status: DC
  Administered 2021-05-02 – 2021-05-03 (×3): 12.5 mg via ORAL
  Filled 2021-05-02 (×3): qty 1

## 2021-05-02 NOTE — Progress Notes (Addendum)
[address]       [place] [address]             [phone]      5 Days Post-Op Procedure(s) (LRB): CORONARY ARTERY BYPASS GRAFTING (CABG) X FOUR,  USING LEFT INTERNAL MAMMARY ARTERY, RIGHT INTERNAL MAMMARY ARTERY, RIGHT RADIAL ARTERY CONDUITS (N/A) possible RADIAL ARTERY HARVEST (Right) TRANSESOPHAGEAL ECHOCARDIOGRAM (TEE) (N/A) INDOCYANINE GREEN FLUORESCENCE IMAGING (ICG) (N/A) Subjective: Feels ok, back in sinus rhythm  Objective: Vital signs in last 24 hours: Temp:  [97.6 F (36.4 C)-98.8 F (37.1 C)] 98.8 F (37.1 C) (06/12 0429) Pulse Rate:  [67-100] 78 (06/12 0429) Cardiac Rhythm: Normal sinus rhythm (06/11 2005) Resp:  [11-20] 17 (06/12 0429) BP: (103-146)/(53-78) 122/71 (06/12 0429) SpO2:  [92 %-100 %] 92 % (06/12 0429) Weight:  [115.3 kg] 115.3 kg (06/12 0429)  Hemodynamic parameters for last 24 hours:    Intake/Output from previous day: 06/11 0701 - 06/12 0700 In: 1379.3 [P.O.:1000; I.V.:379.3] Out: 1980 [Urine:1980] Intake/Output this shift: No intake/output data recorded.  General appearance: alert, cooperative, and no distress Heart: regular rate and rhythm Lungs: clear to auscultation bilaterally Abdomen: benign Extremities: no edema Wound: incis healing well, L hand N/V intact  Lab Results: Recent Labs    04/30/21 0430  WBC 6.1  HGB 11.5*  HCT 34.3*  PLT 139*   BMET:  Recent Labs    05/01/21 0042 05/02/21 0051  NA 138 140  K 3.8 3.4*  CL 100 99  CO2 31 35*  GLUCOSE 147* 126*  BUN 11 9  CREATININE 0.69 0.60*  CALCIUM 8.2* 8.3*    PT/INR: No results for input(s): LABPROT, INR in the last 72 hours. ABG    Component Value Date/Time   PHART 7.285 (L) 04/27/2021 1830   HCO3 24.7 04/27/2021 1830   TCO2 26 04/27/2021 1830   ACIDBASEDEF 2.0 04/27/2021 1830   O2SAT 97.0 04/27/2021 1830   CBG (last 3)  Recent Labs    05/01/21 1949 05/01/21 2331 05/02/21 0429  GLUCAP 152* 84 92    Meds Scheduled  Meds:  acetaminophen  1,000 mg Oral Q6H   Or   acetaminophen (TYLENOL) oral liquid 160 mg/5 mL  1,000 mg Per Tube Q6H   amiodarone  400 mg Oral BID   amLODipine  5 mg Oral Daily   aspirin EC  325 mg Oral Daily   Or   aspirin  324 mg Per Tube Daily   atorvastatin  80 mg Oral QHS   bisacodyl  10 mg Oral Daily   Or   bisacodyl  10 mg Rectal Daily   Chlorhexidine Gluconate Cloth  6 each Topical Daily   colchicine  0.3 mg Oral BID   docusate sodium  200 mg Oral Daily   furosemide  40 mg Oral Daily   insulin aspart  0-24 Units Subcutaneous Q4H   insulin glargine  15 Units Subcutaneous Daily   isosorbide dinitrate  20 mg Oral TID   lidocaine  1 patch Transdermal Q24H   melatonin  3 mg Oral QHS   metFORMIN  1,000 mg Oral BID WC   metoprolol tartrate  25 mg Oral TID   Or   metoprolol tartrate  25 mg Per Tube TID   pantoprazole  40 mg Oral Daily   potassium chloride  20 mEq Oral Daily   Continuous Infusions:  sodium chloride     sodium chloride 10 mL/hr at 04/27/21 1355   insulin Stopped (04/29/21  1132)   lactated ringers     lactated ringers Stopped (04/30/21 0911)   PRN Meds:.dextrose, lactated ringers, metoprolol tartrate, morphine injection, ondansetron (ZOFRAN) IV, oxyCODONE, sodium chloride flush, sodium chloride flush, traMADol  Xrays No results found.  Assessment/Plan: S/P Procedure(s) (LRB): CORONARY ARTERY BYPASS GRAFTING (CABG) X FOUR,  USING LEFT INTERNAL MAMMARY ARTERY, RIGHT INTERNAL MAMMARY ARTERY, RIGHT RADIAL ARTERY CONDUITS (N/A) possible RADIAL ARTERY HARVEST (Right) TRANSESOPHAGEAL ECHOCARDIOGRAM (TEE) (N/A) INDOCYANINE GREEN FLUORESCENCE IMAGING (ICG) (N/A)  1 afeb, VSS sBP 100's to 140's, too labile to start ACE/ARB , sinus rhythm in 60's- will reduce metoprolol doseto 12.5 BID,  was in afib yesterday and also freq PAC's , now on po amio 2 sats ok on ra 3 weight stable if accurate, diuresing well, should be able to d/c lasix soon 4 K+ 3.4 , will replace-  normal renal fxn 5 BS control improved with restart of glucophage 6 I think we can get pacer  wires out today 7 cont pulm toilet/rehab 8 poss home in am if no new issues     LOS: 5 days    Rowe Clack PA-C Pager [phone] 05/02/2021   Patient seen and examined, agree with assessment and plan above Intermittent atrial fib- now on PO amiodarone- monitor  Karston C. Dorris Fetch, MD [facility] [phone]

## 2021-05-02 NOTE — TOC Initial Note (Signed)
Transition of Care (TOC) - Initial/Assessment Note  Donn Pierini RN, BSN Transitions of Care Unit 4E- RN Case Manager See Treatment Team for direct phone #    Patient Details  Name: Jacob Downs MRN: [id number] Date of Birth: [birth year]  Transition of Care [facility]) CM/SW Contact:    Darrold Span, RN Phone Number: 05/02/2021, 12:04 PM  Clinical Narrative:                 Pt s/p  CABG, from home w/ wife. Noted orders for [facility] and DME-3n1. Spoke with pt at bedside to discuss transition of care needs. Pt agreeable to HHPT services, reports he has a walker that he can borrow, confirmed a 3n1 would be useful.  Will call [employer] for DME-3n1 need and have delivered to room prior to discharge.   List provided to pt for [facility] choice- Per CMS guidelines from medicare.gov website with star ratings (copy placed in shadow chart), pt states he would like time to review with wife. CM to f/u on choice tomorrow, and make referral.   Address, phone # and PCP all confirmed with pt in epic.   Expected Discharge Plan: Home w Home Health Services Barriers to Discharge: Continued Medical Work up   Patient Goals and CMS Choice Patient states their goals for this hospitalization and ongoing recovery are:: return home CMS Medicare.gov Compare Post Acute Care list provided to:: Patient Choice offered to / list presented to : Patient  Expected Discharge Plan and Services Expected Discharge Plan: Home w Home Health Services   Discharge Planning Services: CM Consult Post Acute Care Choice: Durable Medical Equipment, Home Health Living arrangements for the past 2 months: Single Family Home                 DME Arranged: 3-N-1 DME Agency: [employer]       HH Arranged: PT          Prior Living Arrangements/Services Living arrangements for the past 2 months: Single Family Home Lives with:: Spouse Patient language and need for interpreter reviewed:: Yes Do you feel safe going back to the place  where you live?: Yes      Need for Family Participation in Patient Care: Yes (Comment) Care giver support system in place?: Yes (comment) Current home services: DME (walker) Criminal Activity/Legal Involvement Pertinent to Current Situation/Hospitalization: No - Comment as needed  Activities of Daily Living Home Assistive Devices/Equipment: Eyeglasses ADL Screening (condition at time of admission) Patient's cognitive ability adequate to safely complete daily activities?: Yes Is the patient deaf or have difficulty hearing?: No Does the patient have difficulty seeing, even when wearing glasses/contacts?: No Does the patient have difficulty concentrating, remembering, or making decisions?: No Patient able to express need for assistance with ADLs?: Yes Does the patient have difficulty dressing or bathing?: No Independently performs ADLs?: Yes (appropriate for developmental age) Does the patient have difficulty walking or climbing stairs?: No Weakness of Legs: None Weakness of Arms/Hands: None  Permission Sought/Granted Permission sought to share information with : Oceanographer granted to share information with : Yes, Verbal Permission Granted     Permission granted to share info w AGENCY: DME/HH        Emotional Assessment Appearance:: Appears stated age Attitude/Demeanor/Rapport: Engaged Affect (typically observed): Appropriate Orientation: : Oriented to Self, Oriented to Place, Oriented to  Time, Oriented to Situation Alcohol / Substance Use: Not Applicable Psych Involvement: No (comment)  Admission diagnosis:  Coronary artery disease [I25.10] Patient  Active Problem List   Diagnosis Date Noted   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 [place] [address] Phone: [phone] Fax: [phone]  [id number] - [place], [place] - [address] AT [facility] [address] [place] [address] Phone: [phone] Fax: [phone]     Social Determinants of Health (SDOH) Interventions    Readmission Risk Interventions No flowsheet data found.

## 2021-05-02 NOTE — Progress Notes (Signed)
Pacing wires removed, pt tolerated well, VSS, bedrest up at 11:45. Will continue to monitor.   Kalman Jewels, RN

## 2021-05-02 NOTE — Progress Notes (Signed)
Mobility Specialist: Progress Note   05/02/21 1214  Mobility  Activity Ambulated in hall  Level of Assistance Modified independent, requires aide device or extra time  Assistive Device Front wheel walker  Distance Ambulated (ft) 470 ft  Mobility Ambulated independently in hallway  Mobility Response Tolerated well  Mobility performed by Mobility specialist  $Mobility charge 1 Mobility   Pre-Mobility: 73 HR, 126/81 BP, 95% SpO2 Post-Mobility: 76 HR, 136/83 BP, 95% SpO2  Pt c/o minimal chest pain during ambulation, no rating given. Pt otherwise asx. Pt back to bed after walk per request with call bell at his side.   [facility] Kynnadi Dicenso Mobility Specialist Mobility Specialist Phone: [phone]

## 2021-05-03 ENCOUNTER — Other Ambulatory Visit: Payer: Self-pay | Admitting: Physician Assistant

## 2021-05-03 ENCOUNTER — Inpatient Hospital Stay (HOSPITAL_COMMUNITY): Payer: BC Managed Care – PPO

## 2021-05-03 LAB — GLUCOSE, CAPILLARY
Glucose-Capillary: 118 mg/dL — ABNORMAL HIGH (ref 70–99)
Glucose-Capillary: 96 mg/dL (ref 70–99)
Glucose-Capillary: 182 mg/dL — ABNORMAL HIGH (ref 70–99)

## 2021-05-03 MED ORDER — ASPIRIN 325 MG PO TBEC: 325.0000 mg | DELAYED_RELEASE_TABLET | Freq: Every day | ORAL | Status: AC

## 2021-05-03 MED ORDER — METOPROLOL TARTRATE 25 MG PO TABS
12.5000 mg | ORAL_TABLET | Freq: Two times a day (BID) | ORAL | 1 refills | Status: DC
Start: 2021-05-03 — End: 2022-04-04

## 2021-05-03 MED ORDER — OXYCODONE HCL 5 MG PO TABS: 5.0000 mg | ORAL_TABLET | ORAL | 0 refills | Status: DC | PRN

## 2021-05-03 MED ORDER — COLCHICINE 0.6 MG PO TABS: 0.3000 mg | ORAL_TABLET | Freq: Two times a day (BID) | ORAL | 0 refills | Status: DC

## 2021-05-03 MED ORDER — FUROSEMIDE 40 MG PO TABS: 40.0000 mg | ORAL_TABLET | Freq: Every day | ORAL | 0 refills | Status: DC

## 2021-05-03 MED ORDER — AMLODIPINE BESYLATE 5 MG PO TABS: 5.0000 mg | ORAL_TABLET | Freq: Every day | ORAL | 1 refills | Status: DC

## 2021-05-03 MED ORDER — AMIODARONE HCL 400 MG PO TABS
400.0000 mg | ORAL_TABLET | Freq: Two times a day (BID) | ORAL | 1 refills | Status: DC
Start: 2021-05-03 — End: 2021-05-25

## 2021-05-03 MED ORDER — POTASSIUM CHLORIDE CRYS ER 20 MEQ PO TBCR: 20.0000 meq | EXTENDED_RELEASE_TABLET | Freq: Every day | ORAL | 0 refills | Status: DC

## 2021-05-03 MED ORDER — LACTULOSE 10 GM/15ML PO SOLN
20.0000 g | Freq: Once | ORAL | Status: AC
  Administered 2021-05-03: 20 g via ORAL
  Filled 2021-05-03: qty 30

## 2021-05-03 MED ORDER — ISOSORBIDE DINITRATE 20 MG PO TABS: 20.0000 mg | ORAL_TABLET | Freq: Three times a day (TID) | ORAL | 0 refills | Status: DC

## 2021-05-03 NOTE — Progress Notes (Signed)
CARDIAC REHAB PHASE I   D/c education completed with pt. Pt educated on importance of site care and monitoring incisions daily. Encouraged continued IS use, walks, and sternal precautions. Pt given in-the-tube sheet along with heart healthy and diabetic diets. Reviewed restrictions and exercise guideline. Will refer to CRP II [place].  [id number] Reynold Bowen, RN BSN 05/03/2021 2:14 PM

## 2021-05-03 NOTE — Progress Notes (Signed)
CARDIAC REHAB PHASE I   PRE:  Rate/Rhythm: 96 SR    SaO2: 92 RA  MODE:  Ambulation: 470 ft   POST:  Rate/Rhythm: 106 ST  BP:  Sitting: 140/87    SaO2: 97 RA  Pt ambulated 463ft in hallway standby assist with front wheel walker. Pt c/o back pain, but denies CP, SOB, or dizziness. Pt returned to bed. Requesting pain medicine, RN aware. Will return to complete education when wife arrives.  [id number] Reynold Bowen, RN BSN 05/03/2021 9:56 AM

## 2021-05-03 NOTE — TOC Transition Note (Signed)
Transition of Care [facility]) - CM/SW Discharge Note Donn Pierini RN, BSN Transitions of Care Unit 4E- RN Case Manager See Treatment Team for direct phone #    Patient Details  Name: Jacob Downs MRN: [id number] Date of Birth: November 26, 1960  Transition of Care [facility]) CM/SW Contact:  Darrold Span, RN Phone Number: 05/03/2021, 11:46 AM   Clinical Narrative:    Follow up done with pt for [facility] choice, per pt, he and his wife have selected 3 agencies that they would be ok with in no particular order, [facility], [facility], [facility]. Per pt he is hopeful for d/c later this afternoon.   [facility] has been called for DME need- 3n1 has been delivered to room for home.   [facility] called for HHPT need- referral has been accepted.    Final next level of care: Home w Home Health Services Barriers to Discharge: Barriers Resolved   Patient Goals and CMS Choice Patient states their goals for this hospitalization and ongoing recovery are:: return home CMS Medicare.gov Compare Post Acute Care list provided to:: Patient Choice offered to / list presented to : Patient  Discharge Placement               Home with [employer]        Discharge Plan and Services   Discharge Planning Services: CM Consult Post Acute Care Choice: Durable Medical Equipment, Home Health          DME Arranged: 3-N-1 DME Agency: [facility] Date DME Agency Contacted: 05/03/21 Time DME Agency Contacted: 1030   HH Arranged: PT HH Agency: [facility] Date [facility] Agency Contacted: 05/03/21 Time HH Agency Contacted: 1146 Representative spoke with at [facility] Agency: Kandee Keen  Social Determinants of Health (SDOH) Interventions     Readmission Risk Interventions Readmission Risk Prevention Plan 05/03/2021  Post Dischage Appt Complete  Medication Screening Complete  Transportation Screening Complete  Some recent data might be hidden

## 2021-05-03 NOTE — Progress Notes (Signed)
Occupational Therapy Treatment Patient Details Name: Jacob Downs MRN: [id number] DOB: Jan 14, 1961 Today's Date: 05/03/2021    History of present illness 60 y.o. male presents to [facility] hospital on 04/27/2021 for planned CABG due to worsening exertional angina and history of single-vessel LAD disease. Pt underwent CABG x4 on 04/27/2021. PMH includes CAD, DM, GERD, HLD, HTN, MI, TB.   OT comments  Pt progressing well towards OT goals, able to verbalize 3/3 sternal precautions and demo improved problem solving implementation of precautions. Pt able to mobilize in room using RW at Supervision level, reports going to bathroom without assist this AM. Pt able to demo improved ability to reach feet for LB ADLs today as well. Reinforced sternal precautions, use of compensatory strategies for ADLs/IADLs with wife assist as needed. Pt on track to DC home with [facility] therapy follow-up.   Follow Up Recommendations  Home health OT    Equipment Recommendations  3 in 1 bedside commode;Other (comment) (rolling walker)    Recommendations for Other Services      Precautions / Restrictions Precautions Precautions: Fall;Sternal Precaution Booklet Issued: Yes (comment) Restrictions Weight Bearing Restrictions: No       Mobility Bed Mobility Overal bed mobility: Modified Independent Bed Mobility: Supine to Sit;Sit to Supine                Transfers Overall transfer level: Modified independent Equipment used: None;Rolling walker (2 wheeled)   Sit to Stand: Modified independent (Device/Increase time)         General transfer comment: able to stand with UEs on lap using momentum to power up, no physical assist needed    Balance Overall balance assessment: Needs assistance Sitting-balance support: No upper extremity supported;Feet supported Sitting balance-Leahy Scale: Good     Standing balance support: Bilateral upper extremity supported Standing balance-Leahy Scale: Poor Standing balance  comment: reliant on at least one UE support                           ADL either performed or assessed with clinical judgement   ADL Overall ADL's : Needs assistance/impaired     Grooming: Modified independent;Standing;Wash/dry face;Brushing hair               Lower Body Dressing: Supervision/safety;Sit to/from stand Lower Body Dressing Details (indicate cue type and reason): able to reach B feet, can also cross LE but cues needed to avoid pulling LE up with UEs too strongly             Functional mobility during ADLs: Supervision/safety;Rolling walker General ADL Comments: Reinforced sternal precautions during ADLs and strategies for IADLs. Encouraged frequent mobility at home     Vision   Vision Assessment?: No apparent visual deficits   Perception     Praxis      Cognition Arousal/Alertness: Awake/alert Behavior During Therapy: WFL for tasks assessed/performed Overall Cognitive Status: Within Functional Limits for tasks assessed                                 General Comments: able to recall sternal precautions        Exercises     Shoulder Instructions       General Comments HR 80s-90sbpm with activity    Pertinent Vitals/ Pain       Pain Assessment: No/denies pain  Home Living  Prior Functioning/Environment              Frequency  Min 2X/week        Progress Toward Goals  OT Goals(current goals can now be found in the care plan section)  Progress towards OT goals: Progressing toward goals  Acute Rehab OT Goals Patient Stated Goal: to return to independence and reduce pain OT Goal Formulation: With patient/family Time For Goal Achievement: 05/13/21 Potential to Achieve Goals: Good ADL Goals Pt Will Perform Grooming: with modified independence;standing Pt Will Perform Upper Body Bathing: with modified independence;sitting Pt Will Perform Lower  Body Dressing: with modified independence;sit to/from stand Pt Will Transfer to Toilet: with modified independence;ambulating  Plan Discharge plan remains appropriate    Co-evaluation                 AM-PAC OT "6 Clicks" Daily Activity     Outcome Measure   Help from another person eating meals?: None Help from another person taking care of personal grooming?: None Help from another person toileting, which includes using toliet, bedpan, or urinal?: A Little Help from another person bathing (including washing, rinsing, drying)?: A Little Help from another person to put on and taking off regular upper body clothing?: A Little Help from another person to put on and taking off regular lower body clothing?: A Little 6 Click Score: 20    End of Session Equipment Utilized During Treatment: Rolling walker  OT Visit Diagnosis: Unsteadiness on feet (R26.81);Pain   Activity Tolerance Patient tolerated treatment well   Patient Left in bed;with call bell/phone within reach   Nurse Communication Mobility status        Time: 6384-5364 OT Time Calculation (min): 15 min  Charges: OT General Charges $OT Visit: 1 Visit OT Treatments $Self Care/Home Management : 8-22 mins  Bradd Canary, OTR/L Acute Rehab Services Office: [phone]    Lorre Munroe 05/03/2021, 11:19 AM

## 2021-05-03 NOTE — Progress Notes (Signed)
Physical Therapy Treatment Patient Details Name: Jacob Downs MRN: [id number] DOB: [birth year] Today's Date: 05/03/2021    History of Present Illness 60 y.o. male presents to [facility] hospital on 04/27/2021 for planned CABG due to worsening exertional angina and history of single-vessel LAD disease. Pt underwent CABG x4 on 04/27/2021. PMH includes CAD, DM, GERD, HLD, HTN, MI, TB.    PT Comments    Pt was seen for last PT visit today, with instruction for body mechanics of sternal precautions, and for determining limits with fatigue.  Pt is reasonable in expectations, and verbalizes understanding for all instruction.  Follow acutely for goals of PT.   Follow Up Recommendations  Home health PT;Supervision for mobility/OOB     Equipment Recommendations  3in1 (PT)    Recommendations for Other Services       Precautions / Restrictions Precautions Precautions: Fall;Sternal Precaution Booklet Issued: Yes (comment) Precaution Comments: lines removed Restrictions Weight Bearing Restrictions: Yes Other Position/Activity Restrictions: sternal precautions    Mobility  Bed Mobility Overal bed mobility: Needs Assistance Bed Mobility: Supine to Sit;Sit to Supine     Supine to sit: Min guard;Min assist Sit to supine: Min guard   General bed mobility comments: min assist to lift legs to surface with cues for precautions    Transfers Overall transfer level: Needs assistance Equipment used: Rolling walker (2 wheeled) Transfers: Sit to/from Stand Sit to Stand: Supervision         General transfer comment: pillow with cues for sequence and safety  Ambulation/Gait Ambulation/Gait assistance: Min guard Gait Distance (Feet): 150 Feet Assistive device: Rolling walker (2 wheeled) Gait Pattern/deviations: Step-through pattern;Decreased stride length Gait velocity: reduced Gait velocity interpretation: <1.31 ft/sec, indicative of household ambulator General Gait Details: correction of his  posture and awerness of Futures trader    Modified Rankin (Stroke Patients Only)       Balance Overall balance assessment: Needs assistance Sitting-balance support: Feet supported;Single extremity supported Sitting balance-Leahy Scale: Good   Postural control: Posterior lean Standing balance support: Bilateral upper extremity supported;During functional activity Standing balance-Leahy Scale: Poor Standing balance comment: increasing standing balance control                            Cognition Arousal/Alertness: Awake/alert Behavior During Therapy: WFL for tasks assessed/performed Overall Cognitive Status: Within Functional Limits for tasks assessed                                 General Comments: demonstrating precautions      Exercises General Exercises - Lower Extremity Ankle Circles/Pumps: AROM;5 reps Quad Sets: AROM;10 reps    General Comments General comments (skin integrity, edema, etc.): HR is 80-90 and sats 94% or greater      Pertinent Vitals/Pain Pain Assessment: Faces Faces Pain Scale: Hurts a little bit Pain Location: chest    Home Living                      Prior Function            PT Goals (current goals can now be found in the care plan section) Acute Rehab PT Goals Patient Stated Goal: to return to independence and reduce pain    Frequency    Min 3X/week  PT Plan Current plan remains appropriate    Co-evaluation              AM-PAC PT "6 Clicks" Mobility   Outcome Measure  Help needed turning from your back to your side while in a flat bed without using bedrails?: A Little Help needed moving from lying on your back to sitting on the side of a flat bed without using bedrails?: A Little Help needed moving to and from a bed to a chair (including a wheelchair)?: A Little Help needed standing up from a chair using your arms (e.g., wheelchair or  bedside chair)?: A Little Help needed to walk in hospital room?: A Little Help needed climbing 3-5 steps with a railing? : A Lot 6 Click Score: 17    End of Session Equipment Utilized During Treatment: Gait belt Activity Tolerance: Patient tolerated treatment well Patient left: in bed;with call bell/phone within reach;with bed alarm set Nurse Communication: Mobility status PT Visit Diagnosis: Unsteadiness on feet (R26.81);Other abnormalities of gait and mobility (R26.89);Muscle weakness (generalized) (M62.81);Pain     Time: 1202-1217 PT Time Calculation (min) (ACUTE ONLY): 15 min  Charges:  $Gait Training: 8-22 mins               Ivar Drape 05/03/2021, 3:34 PM  Samul Dada, PT MS Acute Rehab Dept. Number: [facility] [id number] and [facility] [phone]

## 2021-05-03 NOTE — Progress Notes (Signed)
Mobility Specialist - Progress Note   05/03/21 1408  Mobility  Activity Ambulated in room  Level of Assistance Standby assist, set-up cues, supervision of patient - no hands on  Assistive Device Front wheel walker  Distance Ambulated (ft) 72 ft  Mobility Ambulated with assistance in room  Mobility Response Tolerated well  Mobility performed by Mobility specialist  $Mobility charge 1 Mobility   Pt asx throughout ambulation. Pt back in bed after walk, call bell at side.   Mamie Levers Mobility Specialist Mobility Specialist Phone: [phone]

## 2021-05-03 NOTE — Progress Notes (Signed)
Plan of care reviewed. Pt has been progressing. Hemodynamics stable, NSR on monitor, afebrile, SPO2 93-95% on room air, no respiratory distress. Pain tolerated well. Sternal incision and right arm incision dry and clean.  Pt has no BM since previous this admission. MD aware. Stated his BM is usually not regular, sometimes once in every week or it can be every day. Laxative and stool softener given by day shift. Stated he has passed some flatus. He has better appetite yesterday. Encouraged to eat more fruit and vegetables. No acute distress noted. We will monitor.  Filiberto Pinks, RN

## 2021-05-03 NOTE — Progress Notes (Signed)
      [address]       [place] [address]             [phone]        6 Days Post-Op Procedure(s) (LRB): CORONARY ARTERY BYPASS GRAFTING (CABG) X FOUR,  USING LEFT INTERNAL MAMMARY ARTERY, RIGHT INTERNAL MAMMARY ARTERY, RIGHT RADIAL ARTERY CONDUITS (N/A) possible RADIAL ARTERY HARVEST (Right) TRANSESOPHAGEAL ECHOCARDIOGRAM (TEE) (N/A) INDOCYANINE GREEN FLUORESCENCE IMAGING (ICG) (N/A)  Subjective: Patient passing flatus but no bowel movement yet. He states he often only goes once per week  Objective: Vital signs in last 24 hours: Temp:  [97.8 F (36.6 C)-98.8 F (37.1 C)] 98 F (36.7 C) (06/13 0447) Pulse Rate:  [70-85] 85 (06/13 0447) Cardiac Rhythm: Normal sinus rhythm (06/13 0300) Resp:  [13-20] 13 (06/13 0447) BP: (115-130)/(65-81) 125/81 (06/13 0447) SpO2:  [93 %-97 %] 93 % (06/13 0447) Weight:  [115.8 kg] 115.8 kg (06/13 0449)  Pre op weight  112.5 kg Current Weight  05/03/21 115.8 kg     Intake/Output from previous day: 06/12 0701 - 06/13 0700 In: 550 [P.O.:550] Out: 1300 [Urine:1300]   Physical Exam:  Cardiovascular: RRR Pulmonary: Slightly diminished bibasilar breath sounds Abdomen: Soft, obese, non tender, bowel sounds present. Extremities: Mild bilateral lower extremity edema. RUE motor/sensory intact Wounds: Sternal and RUE wounds are clean and dry.  No erythema or signs of infection.  Lab Results: CBC:No results for input(s): WBC, HGB, HCT, PLT in the last 72 hours. BMET:  Recent Labs    05/01/21 0042 05/02/21 0051  NA 138 140  K 3.8 3.4*  CL 100 99  CO2 31 35*  GLUCOSE 147* 126*  BUN 11 9  CREATININE 0.69 0.60*  CALCIUM 8.2* 8.3*    PT/INR:  Lab Results  Component Value Date   INR 1.4 (H) 04/27/2021   INR 1.1 04/23/2021   INR 1.26 11/30/2015   ABG:  INR: Will add last result for INR, ABG once components are confirmed Will add last 4 CBG results once components are confirmed  Assessment/Plan:  1. CV -  Previous a fib, PACs. Maintaining SR with HR in the 70-80's. On Amlodipine 5 mg daily, Amiodarone 400 mg bid, Isordil 10 mg tid, Lopressor 12.5 mg bid. 2.  Pulmonary - On room air. Encourage incentive spirometer. 3. Volume Overload - On Lasix 40 mg daily 4.  Expected post op acute blood loss anemia - Last H and H 06/10 stable at 11.5 and 34.3  5. DM-CBGs 123/98/118. On Insulin, Metformin 1000 mg bid, and Actos 15 mg every am. Pre op HGA1C 7.9. Will need close medical follow up after discharge. 6. Mild thrombocytopenia-last platelets up to 139,000 on 06/10. 7. Likely discharge later today  Eriel Doyon M ZimmermanPA-C 05/03/2021,7:31 AM

## 2021-05-04 ENCOUNTER — Other Ambulatory Visit: Payer: Self-pay | Admitting: Physician Assistant

## 2021-05-04 ENCOUNTER — Other Ambulatory Visit: Payer: Self-pay | Admitting: *Deleted

## 2021-05-04 MED ORDER — BENZONATATE 100 MG PO CAPS: 100.0000 mg | ORAL_CAPSULE | Freq: Three times a day (TID) | ORAL | 0 refills | Status: DC | PRN

## 2021-05-04 NOTE — Progress Notes (Signed)
Patient contacted the office stating he has a cough s/p CABG 04/27/21. Patient states the cough was present prior to surgery. Patient states the cough is causing him a great deal of pain. States it is productive with clear sputum. Patient encouraged to use his incentive spirometer as well as his support pillow when coughing. Per Dr. Vickey Sages, a prescription for [name] has been sent to patients preferred pharmacy. Patient also advised he may resume using the inhaler prescribed by Dr. Vickey Sages prior to surgery. Patient verbalizes understanding.

## 2021-05-05 ENCOUNTER — Other Ambulatory Visit: Payer: Self-pay | Admitting: Physician Assistant

## 2021-05-05 ENCOUNTER — Telehealth: Payer: Self-pay

## 2021-05-05 ENCOUNTER — Other Ambulatory Visit: Payer: Self-pay | Admitting: Cardiothoracic Surgery

## 2021-05-05 DIAGNOSIS — Z951 Presence of aortocoronary bypass graft: Secondary | ICD-10-CM

## 2021-05-05 NOTE — Telephone Encounter (Signed)
STD form for [employer] completed and faxed to  [phone]. Beginning leave 04/23/21 through 07/19/21 Surgery date 04/27/21. Forms mailed to home address.

## 2021-05-06 ENCOUNTER — Ambulatory Visit
Admission: RE | Admit: 2021-05-06 | Discharge: 2021-05-06 | Disposition: A | Discharge status: Home / Self Care | Payer: BC Managed Care – PPO | Source: Ambulatory Visit | Attending: Cardiothoracic Surgery | Admitting: Cardiothoracic Surgery

## 2021-05-06 ENCOUNTER — Other Ambulatory Visit: Payer: Self-pay

## 2021-05-06 ENCOUNTER — Ambulatory Visit (INDEPENDENT_AMBULATORY_CARE_PROVIDER_SITE_OTHER): Payer: Self-pay | Admitting: Cardiothoracic Surgery

## 2021-05-06 VITALS — BP 113/73 | HR 83 | Temp 97.7°F | Resp 20 | Ht 72.0 in | Wt 233.0 lb

## 2021-05-06 DIAGNOSIS — Z951 Presence of aortocoronary bypass graft: Secondary | ICD-10-CM

## 2021-05-10 ENCOUNTER — Other Ambulatory Visit: Payer: Self-pay | Admitting: *Deleted

## 2021-05-10 MED ORDER — TRAMADOL HCL 50 MG PO TABS: 50.0000 mg | ORAL_TABLET | Freq: Four times a day (QID) | ORAL | 0 refills | Status: DC | PRN

## 2021-05-10 NOTE — Progress Notes (Signed)
Patient contacted the office requesting a refill of pain medication s/p CABG by Dr. Vickey Sages 6/7. Patient states pain is localized around incision. Per Webb Laws, PA, tramadol 50mg  PO Q6hrs PRN sent to patient's preferred pharmacy of [facility].

## 2021-05-12 ENCOUNTER — Telehealth (HOSPITAL_COMMUNITY): Payer: Self-pay

## 2021-05-12 NOTE — Telephone Encounter (Signed)
Attempted to call patient in regards to Cardiac Rehab - LM on VM 

## 2021-05-12 NOTE — Telephone Encounter (Signed)
Pt insurance is active and benefits verified through [employer] Co-pay 0, DED $4,000/$4,000 met, out of pocket $4,000/$4,000 met, co-insurance 25%. no pre-authorization required. Passport, 05/12/2021_0 :53am, REF# [id number]   Will contact patient to see if he is interested in the Cardiac Rehab Program. If interested, patient will need to complete follow up appt. Once completed, patient will be contacted for scheduling upon review by the RN Navigator.

## 2021-05-13 NOTE — Progress Notes (Signed)
Cardiology Clinic Note   Patient Name: Jacob Downs Date of Encounter: 05/17/2021  Primary Care Provider:  Catha Gosselin, MD Primary Cardiologist:  Olga Millers, MD  Patient Profile    Jacob Downs 60 year old male presents to the clinic today status post CABG x4 by Dr. Vickey Sages on 04/27/2021.  Past Medical History    Past Medical History:  Diagnosis Date   Anginal pain (HCC)    Arthritis    Cellulitis of left hand 11/27/2015   Coronary artery disease    Diabetes mellitus without complication (HCC)    GERD (gastroesophageal reflux disease)    Hyperlipemia    Hypertension    Myocardial infarction (HCC)    Shingles    4-5 years ago   Tuberculosis    As a child   Past Surgical History:  Procedure Laterality Date   CARDIAC CATHETERIZATION N/A 11/30/2015   Procedure: Left Heart Cath and Coronary Angiography;  Surgeon: Kathleene Hazel, MD;  Location: [facility] INVASIVE CV LAB;  Service: Cardiovascular;  Laterality: N/A;   COLONOSCOPY     CORONARY ARTERY BYPASS GRAFT N/A 04/27/2021   Procedure: CORONARY ARTERY BYPASS GRAFTING (CABG) X FOUR,  USING LEFT INTERNAL MAMMARY ARTERY, RIGHT INTERNAL MAMMARY ARTERY, RIGHT RADIAL ARTERY CONDUITS;  Surgeon: Linden Dolin, MD;  Location: [facility] OR;  Service: Open Heart Surgery;  Laterality: N/A;   I & D EXTREMITY Right 11/26/2014   Procedure: INCISION AND DRAINAGE OF RIGHT INDEX FINGER;  Surgeon: Dairl Ponder, MD;  Location: [facility] OR;  Service: Orthopedics;  Laterality: Right;   INTRAVASCULAR PRESSURE WIRE/FFR STUDY N/A 04/15/2021   Procedure: INTRAVASCULAR PRESSURE WIRE/FFR STUDY;  Surgeon: Corky Crafts, MD;  Location: [facility] INVASIVE CV LAB;  Service: Cardiovascular;  Laterality: N/A;   LEFT HEART CATH AND CORONARY ANGIOGRAPHY N/A 04/15/2021   Procedure: LEFT HEART CATH AND CORONARY ANGIOGRAPHY;  Surgeon: Corky Crafts, MD;  Location: [facility] INVASIVE CV LAB;  Service: Cardiovascular;  Laterality: N/A;   ORIF ANKLE FRACTURE   10/03/2012   Procedure: OPEN REDUCTION INTERNAL FIXATION (ORIF) ANKLE FRACTURE;  Surgeon: Nestor Lewandowsky, MD;  Location: [facility];  Service: Orthopedics;  Laterality: Left;   PLANTAR FASCIA SURGERY  06,07   both feet   RADIAL ARTERY HARVEST Right 04/27/2021   Procedure: possible RADIAL ARTERY HARVEST;  Surgeon: Linden Dolin, MD;  Location: [facility] OR;  Service: Open Heart Surgery;  Laterality: Right;   TEE WITHOUT CARDIOVERSION N/A 04/27/2021   Procedure: TRANSESOPHAGEAL ECHOCARDIOGRAM (TEE);  Surgeon: Linden Dolin, MD;  Location: [facility] OR;  Service: Open Heart Surgery;  Laterality: N/A;   TONSILLECTOMY     as a child    Allergies  Allergies  Allergen Reactions   Glimepiride Other (See Comments)    hypoglycemia    History of Present Illness    [name] has a PMH of GERD, HLD, HTN, MI, shingles, tuberculosis, diabetes, and coronary artery disease status post cardiac catheterization 04/15/2021 which showed severe multivessel coronary artery disease.  He underwent CABG x4 on 04/27/2021.  He was extubated early on the evening of surgery.  He remained hemodynamically stable and was weaned off his vasopressor medications.  He was started on Lopressor.  His chest tubes, Swan-Ganz catheter, and Foley catheter were removed early in his postoperative course without complication.  Due to postoperative blood loss anemia he did require postoperative blood transfusion.  His hypertension was not well controlled so he was started on amlodipine.  He was also noted  to have some atrial fibrillation postoperatively and was started on amiodarone.  He converted to sinus rhythm.  His oxygen was weaned to 2 L and then to room air.  He tolerated his diet well.  Surgical incisions were healing well without infection.  His epicardial wires were removed on 05/02/2021.  He was tolerating his diet well and passing flatus.  He was discharged in stable condition on 05/03/2021.  He presents to the clinic  today for follow-up evaluation states he is slowly increasing his physical activity and maintaining sternal precautions.  He is trying to avoid extra sodium in his diet and has not been salting his food.  We reviewed the importance of low-cholesterol diet and low-cholesterol foods.  We discussed his coronary artery disease and bypass surgery.  I will give him the salty 6 diet sheet, have him continue to slowly increase his physical activity while maintaining sternal precautions, repeat his fasting lipids and LFTs, and have him follow-up in 3 to 4 months.  He is unclear about some of his medications on his medication list.  He will call office to review his medication list.  Today he denies chest pain, shortness of breath, lower extremity edema, fatigue, palpitations, melena, hematuria, hemoptysis, diaphoresis, weakness, presyncope, syncope, orthopnea, and PND.     Home Medications    Prior to Admission medications   Medication Sig Start Date End Date Taking? Authorizing Provider  amiodarone (PACERONE) 400 MG tablet Take 1 tablet (400 mg total) by mouth 2 (two) times daily. For 7 days then once daily 05/03/21   Ardelle Balls, PA-C  amLODipine (NORVASC) 5 MG tablet Take 1 tablet (5 mg total) by mouth daily. 05/03/21   Ardelle Balls, PA-C  aspirin EC 325 MG EC tablet Take 1 tablet (325 mg total) by mouth daily. 05/03/21   Ardelle Balls, PA-C  atorvastatin (LIPITOR) 80 MG tablet Take 1 tablet (80 mg total) by mouth at bedtime. 04/30/21   Ardelle Balls, PA-C  benzonatate (TESSALON PERLES) 100 MG capsule Take 1 capsule (100 mg total) by mouth 3 (three) times daily as needed for cough. 05/04/21   Linden Dolin, MD  Cholecalciferol (VITAMIN D3 MAXIMUM STRENGTH) 125 MCG (5000 UT) capsule Take 5,000 Units by mouth in the morning.    [provider]  colchicine 0.6 MG tablet Take 0.5 tablets (0.3 mg total) by mouth 2 (two) times daily. 05/03/21   Ardelle Balls,  PA-C  famotidine (PEPCID) 20 MG tablet Take 40 mg by mouth at bedtime.    [provider]  furosemide (LASIX) 40 MG tablet Take 1 tablet (40 mg total) by mouth daily. For 5 days then stop. 05/03/21   Ardelle Balls, PA-C  isosorbide dinitrate (ISORDIL) 20 MG tablet Take 1 tablet (20 mg total) by mouth 3 (three) times daily. 05/03/21   Doree Fudge M, PA-C  JARDIANCE 25 MG TABS tablet Take 25 mg by mouth in the morning. 01/09/19   [provider]  metFORMIN (GLUCOPHAGE) 500 MG tablet Take 2 tablets (1,000 mg total) by mouth 2 (two) times daily with a meal. 04/17/21   Corky Crafts, MD  metoprolol tartrate (LOPRESSOR) 25 MG tablet Take 0.5 tablets (12.5 mg total) by mouth 2 (two) times daily. 05/03/21   Ardelle Balls, PA-C  Omega-3 Fatty Acids (FISH OIL) 1200 MG CAPS Take 1,200 mg by mouth in the morning.    [provider]  oxyCODONE (OXY IR/ROXICODONE) 5 MG immediate release tablet Take  1 tablet (5 mg total) by mouth every 4 (four) hours as needed for severe pain. 05/03/21   Ardelle BallsZimmerman, Donielle M, PA-C  pioglitazone (ACTOS) 15 MG tablet Take 15 mg by mouth in the morning. 04/16/21   [provider]  potassium chloride SA (KLOR-CON) 20 MEQ tablet Take 1 tablet (20 mEq total) by mouth daily. For 5 days then stop. 05/03/21   Ardelle BallsZimmerman, Donielle M, PA-C  traMADol (ULTRAM) 50 MG tablet Take 1 tablet (50 mg total) by mouth every 6 (six) hours as needed. 05/10/21   Rowe ClackGold, Wayne E, PA-C    Family History    Family History  Problem Relation Age of Onset   Colon cancer Neg Hx    CAD Other        No family history   He indicated that the status of his neg hx is unknown. He indicated that the status of his other is unknown.  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  Tobacco Use   Smoking status: Never   Smokeless tobacco: Never  Vaping Use   Vaping Use: Never used  Substance and Sexual Activity   Alcohol use: Yes    Alcohol/week: 0.0 standard drinks    Comment: 6 to 12 beers per week   Drug use: No   Sexual activity: Not on file  Other Topics Concern   Not on file  Social History Narrative   Not on file   Social Determinants of Health   Financial Resource Strain: Not on file  Food Insecurity: Not on file  Transportation Needs: Not on file  Physical Activity: Not on file  Stress: Not on file  Social Connections: Not on file  Intimate Partner Violence: Not on file     Review of Systems    General:  No chills, fever, night sweats or weight changes.  Cardiovascular:  No chest pain, dyspnea on exertion, edema, orthopnea, palpitations, paroxysmal nocturnal dyspnea. Dermatological: No rash, lesions/masses Respiratory: No cough, dyspnea Urologic: No hematuria, dysuria Abdominal:   No nausea, vomiting, diarrhea, bright red blood per rectum, melena, or hematemesis Neurologic:  No visual changes, wkns, changes in mental status. All other systems reviewed and are otherwise negative except as noted above.  Physical Exam    VS:  BP 124/62   Pulse 70   Ht 6' (1.829 m)   Wt 237 lb 12.8 oz (107.9 kg)   SpO2 98%   BMI 32.25 kg/m  , BMI Body mass index is 32.25 kg/m. GEN: Well nourished, well developed, in no acute distress. HEENT: normal. Neck: Supple, no JVD, carotid bruits, or masses. Cardiac: RRR, no murmurs, rubs, or gallops. No clubbing, cyanosis, edema.  Radials/DP/PT 2+ and equal bilaterally.  Respiratory:  Respirations regular and unlabored, clear to auscultation bilaterally. GI: Soft, nontender, nondistended, BS + x 4. MS: no deformity or atrophy. Skin: warm and dry, no rash.  Sternal incision well approximated and healing well no drainage or erythema.  Right radial incision  healing well no drainage or erythema. Neuro:  Strength and sensation are intact. Psych: Normal affect.  Accessory Clinical Findings    Recent Labs: 04/23/2021: ALT 85 04/28/2021: Magnesium 2.3 04/30/2021: Hemoglobin 11.5; Platelets 139 05/02/2021: BUN 9; Creatinine, Ser 0.60;  Potassium 3.4; Sodium 140   Recent Lipid Panel    Component Value Date/Time   CHOL 126 11/30/2015 0237   TRIG 84 11/30/2015 0237   HDL 30 (L) 11/30/2015 0237   CHOLHDL 4.2 11/30/2015 0237   VLDL 17 11/30/2015 0237   LDLCALC 79 11/30/2015 0237    ECG personally reviewed by me today-normal sinus rhythm T wave abnormality consider anterior ischemia 70 bpm  Cardiac catheterization 04/15/2021 Ost LAD to Prox LAD lesion is 20% stenosed. Prox LAD to Mid LAD lesion is 90% stenosed. Mid LAD lesion is 70% stenosed. 1st Diag-1 lesion is 75% stenosed. 1st Diag-2 lesion is 75% stenosed. 2nd Mrg lesion is 75% stenosed. 1st Mrg lesion is 20% stenosed. Prox RCA to Dist RCA lesion is 40% stenosed. RPAV lesion is 30% stenosed. Mid Cx lesion is 80% stenosed. Ost Cx to Prox Cx lesion is 25% stenosed. Prox RCA lesion is 60% stenosed. Significant by FFR. The left ventricular systolic function is normal. LV end diastolic pressure is normal. The left ventricular ejection fraction is 55-65% by visual estimate. There is no aortic valve stenosis.   Severe disease in the heavily calcified LAD including bifurcation disease with large diagonal. Bifurcation lesion at the mid circumflex, OM.   RCA with angiographically moderate disease.  Heavily calcified.  FFR 0.77 after IV adenosine.   Plan for cardiac surgery consult for multivessel CAD.  He will needs grafts to LAD, large diagonal, OM2, OM3 and PDA.  Echocardiogram 05/20/2021 IMPRESSIONS     1. Left ventricular ejection fraction, by estimation, is 60 to 65%. The  left ventricle has normal function. Left ventricular endocardial border  not optimally defined to evaluate  regional wall motion. Left ventricular  diastolic parameters are consistent  with Grade I diastolic dysfunction (impaired relaxation).   2. Right ventricular systolic function is normal. The right ventricular  size is normal. Tricuspid regurgitation signal is inadequate for assessing  PA pressure.   3. The mitral valve is grossly normal. Trivial mitral valve  regurgitation. No evidence of mitral stenosis.   4. The aortic valve is tricuspid. Aortic valve regurgitation is not  visualized. No aortic stenosis is present.   5. The inferior vena cava is normal in size with greater than 50%  respiratory variability, suggesting right atrial pressure of 3 mmHg.   Conclusion(s)/Recommendation(s): Normal biventricular function without  evidence of hemodynamically significant valvular heart disease.   Assessment & Plan   1.  Coronary artery disease-status post CABG x4 04/27/2021.  He was discharged in stable condition on 05/03/2021.  Denies recurrent episodes of chest discomfort/chest pain. Continue amlodipine, atorvastatin, aspirin, metoprolol Heart healthy low-sodium diet-salty 6 given Increase physical activity as tolerated-maintain sternal precautions  Postoperative atrial fibrillation-no recent episodes of accelerated or irregular heartbeats.  He did convert to sinus rhythm.  EKG today shows normal sinus rhythm T wave abnormality consider anterior ischemia 70 bpm. Continue amiodarone, metoprolol Heart healthy low-sodium diet-salty 6 given Increase physical activity as tolerated-maintain sternal precautions  Hyperlipidemia-LDL 75 07/03/2020 Continue atorvastatin, omega-3 fatty acids Heart healthy low-sodium high-fiber diet Increase physical activity as tolerated-maintain sternal precautions Fasting lipids and LFTs  Essential hypertension-BP today 124/62 Continue amlodipine, isosorbide, metoprolol Heart healthy low-sodium diet-salty 6 given Increase physical activity as tolerated-maintain  sternal precautions  Type 2 diabetes-glucose 126 on 05/02/2021 Continue metformin, Jardiance Heart healthy low-sodium diet-salty 6 given    Disposition: Follow-up with Dr. Jens Som in 3 months.  Thomasene Ripple. Lamerle Jabs NP-C    05/17/2021, 4:00 PM [facility] [address] Office [phone] Fax [phone]  Notice: This dictation was prepared with Dragon dictation along with smaller phrase technology. Any transcriptional errors that result from this process are unintentional and may not be corrected upon review.  I spent 15 minutes examining this patient, reviewing medications, and using patient centered shared decision making involving her cardiac care.  Prior to her visit I spent greater than 20 minutes reviewing her past medical history,  medications, and prior cardiac tests.

## 2021-05-14 NOTE — Progress Notes (Signed)
[address]       [place] [address]             [phone]     CARDIOTHORACIC SURGERY OFFICE NOTE  Referring Provider is Jens Som, Madolyn Frieze, MD Primary Cardiologist is Olga Millers, MD PCP is Catha Gosselin, MD   HPI:  60 year old man underwent CABG on 04/27/2021.  He did well after surgery and now returns for his initial postoperative evaluation.  He has no complaints.  He denies angina or fevers or chills.  He is slowly improving his exercise tolerance.  He is eager to get back to work.   Current Outpatient Medications  Medication Sig Dispense Refill   amiodarone (PACERONE) 400 MG tablet Take 1 tablet (400 mg total) by mouth 2 (two) times daily. For 7 days then once daily 37 tablet 1   amLODipine (NORVASC) 5 MG tablet Take 1 tablet (5 mg total) by mouth daily. 30 tablet 1   aspirin EC 325 MG EC tablet Take 1 tablet (325 mg total) by mouth daily.     atorvastatin (LIPITOR) 80 MG tablet Take 1 tablet (80 mg total) by mouth at bedtime.     benzonatate (TESSALON PERLES) 100 MG capsule Take 1 capsule (100 mg total) by mouth 3 (three) times daily as needed for cough. 20 capsule 0   Cholecalciferol (VITAMIN D3 MAXIMUM STRENGTH) 125 MCG (5000 UT) capsule Take 5,000 Units by mouth in the morning.     colchicine 0.6 MG tablet Take 0.5 tablets (0.3 mg total) by mouth 2 (two) times daily. 47 tablet 0   famotidine (PEPCID) 20 MG tablet Take 40 mg by mouth at bedtime.     furosemide (LASIX) 40 MG tablet Take 1 tablet (40 mg total) by mouth daily. For 5 days then stop. 5 tablet 0   isosorbide dinitrate (ISORDIL) 20 MG tablet Take 1 tablet (20 mg total) by mouth 3 (three) times daily. 90 tablet 0   JARDIANCE 25 MG TABS tablet Take 25 mg by mouth in the morning.     metFORMIN (GLUCOPHAGE) 500 MG tablet Take 2 tablets (1,000 mg total) by mouth 2 (two) times daily with a meal.     metoprolol tartrate (LOPRESSOR) 25 MG tablet Take 0.5 tablets (12.5 mg total) by mouth 2 (two)  times daily. 30 tablet 1   Omega-3 Fatty Acids (FISH OIL) 1200 MG CAPS Take 1,200 mg by mouth in the morning.     oxyCODONE (OXY IR/ROXICODONE) 5 MG immediate release tablet Take 1 tablet (5 mg total) by mouth every 4 (four) hours as needed for severe pain. 28 tablet 0   pioglitazone (ACTOS) 15 MG tablet Take 15 mg by mouth in the morning.     potassium chloride SA (KLOR-CON) 20 MEQ tablet Take 1 tablet (20 mEq total) by mouth daily. For 5 days then stop. 5 tablet 0   traMADol (ULTRAM) 50 MG tablet Take 1 tablet (50 mg total) by mouth every 6 (six) hours as needed. 28 tablet 0   No current facility-administered medications for this visit.      Physical Exam:   BP 113/73   Pulse 83   Temp 97.7 F (36.5 C)   Resp 20   Ht 6' (1.829 m)   Wt 105.7 kg   SpO2 97%   BMI 31.60 kg/m   General:  Well-appearing no acute distress  Chest:   Clear to auscultation bilaterally  CV:   Regular rate and rhythm  Incisions:  Healing well sternum stable  Abdomen:  Soft nontender  Extremities:  No edema  Diagnostic Tests:  I personally reviewed his available imaging studies which demonstrated clear lung fields and a stable mediastinum   Impression:  Doing well after CABG surgery  Plan:  Follow-up with thoracic surgery as needed Okay to drive in 1 to 2 weeks  I spent in excess of 10 minutes during the conduct of this office consultation and >50% of this time involved direct face-to-face encounter with the patient for counseling and/or coordination of their care.  Level 2                 10 minutes Level 3                 15 minutes Level 4                 25 minutes Level 5                 40 minutes  B.  Lorayne Marek, MD 05/14/2021 3:11 PM

## 2021-05-17 ENCOUNTER — Encounter: Payer: Self-pay | Admitting: General Practice

## 2021-05-17 ENCOUNTER — Ambulatory Visit (INDEPENDENT_AMBULATORY_CARE_PROVIDER_SITE_OTHER): Payer: BC Managed Care – PPO | Admitting: General Practice

## 2021-05-17 ENCOUNTER — Other Ambulatory Visit: Payer: Self-pay

## 2021-05-17 VITALS — BP 124/62 | HR 70 | Ht 72.0 in | Wt 237.8 lb

## 2021-05-17 DIAGNOSIS — E785 Hyperlipidemia, unspecified: Secondary | ICD-10-CM | POA: Diagnosis not present

## 2021-05-17 DIAGNOSIS — I4891 Unspecified atrial fibrillation: Secondary | ICD-10-CM

## 2021-05-17 DIAGNOSIS — E118 Type 2 diabetes mellitus with unspecified complications: Secondary | ICD-10-CM

## 2021-05-17 DIAGNOSIS — I10 Essential (primary) hypertension: Secondary | ICD-10-CM | POA: Diagnosis not present

## 2021-05-17 DIAGNOSIS — Z79899 Other long term (current) drug therapy: Secondary | ICD-10-CM

## 2021-05-17 DIAGNOSIS — I2510 Atherosclerotic heart disease of native coronary artery without angina pectoris: Secondary | ICD-10-CM | POA: Diagnosis not present

## 2021-05-17 NOTE — Patient Instructions (Signed)
Medication Instructions:  The current medical regimen is effective;  continue present plan and medications as directed. Please refer to the Current Medication list given to you today.  *If you need a refill on your cardiac medications before your next appointment, please call your pharmacy*  Lab Work: FASTING LIPID AND LFT WITHIN THE NEXT 1-2 WEEKS If you have labs (blood work) drawn today and your tests are completely normal, you will receive your results only by:  MyChart Message (if you have MyChart) OR A paper copy in the mail.  If you have any lab test that is abnormal or we need to change your treatment, we will call you to review the results. You may go to any [facility] that is convenient for you however, we do have a lab in our office that is able to assist you. You DO NOT need an appointment for our lab. The lab is open 8:00am and closes at 4:00pm. Lunch 12:45 - 1:45pm.  Special Instructions PLEASE READ AND FOLLOW SALTY 6-ATTACHED-1,800 mg daily  PLEASE INCREASE PHYSICAL ACTIVITY AS TOLERATED-MAINTAIN STERNAL PRECAUTIONS  Follow-Up: Your next appointment:  3-4 month(s) In Person with Olga Millers, MD ONLY  At [facility], you and your health needs are our priority.  As part of our continuing mission to provide you with exceptional heart care, we have created designated Provider Care Teams.  These Care Teams include your primary Cardiologist (physician) and Advanced Practice Providers (APPs -  Physician Assistants and Nurse Practitioners) who all work together to provide you with the care you need, when you need it.  We recommend signing up for the patient portal called "MyChart".  Sign up information is provided on this After Visit Summary.  MyChart is used to connect with patients for Virtual Visits (Telemedicine).  Patients are able to view lab/test results, encounter notes, upcoming appointments, etc.  Non-urgent messages can be sent to your provider as well.   To learn more about  what you can do with MyChart, go to [email or web address].

## 2021-05-18 ENCOUNTER — Other Ambulatory Visit: Payer: Self-pay | Admitting: *Deleted

## 2021-05-18 MED ORDER — TRAMADOL HCL 50 MG PO TABS: 50.0000 mg | ORAL_TABLET | Freq: Four times a day (QID) | ORAL | 0 refills | Status: DC | PRN

## 2021-05-18 NOTE — Progress Notes (Signed)
Patient called requesting a refill for Tramadol. Patient states pain is 6/10. States he has been taking Tylenol without relief. Per Gaynelle Arabian, PA, a refill of Tramadol was called into patient's preferred pharmacy. Patient aware.

## 2021-05-19 ENCOUNTER — Other Ambulatory Visit: Payer: Self-pay | Admitting: Cardiothoracic Surgery

## 2021-05-19 DIAGNOSIS — Z951 Presence of aortocoronary bypass graft: Secondary | ICD-10-CM

## 2021-05-25 ENCOUNTER — Ambulatory Visit (INDEPENDENT_AMBULATORY_CARE_PROVIDER_SITE_OTHER): Payer: Self-pay | Admitting: Cardiothoracic Surgery

## 2021-05-25 ENCOUNTER — Other Ambulatory Visit: Payer: Self-pay

## 2021-05-25 ENCOUNTER — Ambulatory Visit
Admission: RE | Admit: 2021-05-25 | Discharge: 2021-05-25 | Disposition: A | Discharge status: Home / Self Care | Payer: BC Managed Care – PPO | Source: Ambulatory Visit | Attending: Cardiothoracic Surgery | Admitting: Cardiothoracic Surgery

## 2021-05-25 ENCOUNTER — Encounter: Payer: Self-pay | Admitting: Cardiothoracic Surgery

## 2021-05-25 VITALS — BP 120/74 | HR 66 | Resp 20 | Ht 72.0 in | Wt 237.0 lb

## 2021-05-25 DIAGNOSIS — Z951 Presence of aortocoronary bypass graft: Secondary | ICD-10-CM

## 2021-05-25 MED ORDER — TRAMADOL HCL 50 MG PO TABS: 50.0000 mg | ORAL_TABLET | Freq: Four times a day (QID) | ORAL | 0 refills | Status: DC | PRN

## 2021-05-25 NOTE — Progress Notes (Signed)
[address]       [place] [address]             [phone]     CARDIOTHORACIC SURGERY OFFICE NOTE  Referring Provider is Jens Som, Madolyn Frieze, MD Primary Cardiologist is Olga Millers, MD PCP is Catha Gosselin, MD   HPI:  60 year old man status post CABG 04/27/2021.  He did well after surgery and now presents for second postoperative visit.  He states that his energy level is slowly improving.  He denies any chest pain or shortness of breath.   Current Outpatient Medications  Medication Sig Dispense Refill   amLODipine (NORVASC) 5 MG tablet Take 1 tablet (5 mg total) by mouth daily. 30 tablet 1   aspirin EC 325 MG EC tablet Take 1 tablet (325 mg total) by mouth daily.     atorvastatin (LIPITOR) 80 MG tablet Take 1 tablet (80 mg total) by mouth at bedtime.     benzonatate (TESSALON PERLES) 100 MG capsule Take 1 capsule (100 mg total) by mouth 3 (three) times daily as needed for cough. 20 capsule 0   Cholecalciferol (VITAMIN D3 MAXIMUM STRENGTH) 125 MCG (5000 UT) capsule Take 5,000 Units by mouth in the morning.     famotidine (PEPCID) 20 MG tablet Take 40 mg by mouth at bedtime.     JARDIANCE 25 MG TABS tablet Take 25 mg by mouth in the morning.     metFORMIN (GLUCOPHAGE) 500 MG tablet Take 2 tablets (1,000 mg total) by mouth 2 (two) times daily with a meal.     metoprolol tartrate (LOPRESSOR) 25 MG tablet Take 0.5 tablets (12.5 mg total) by mouth 2 (two) times daily. 30 tablet 1   Omega-3 Fatty Acids (FISH OIL) 1200 MG CAPS Take 1,200 mg by mouth in the morning.     pioglitazone (ACTOS) 15 MG tablet Take 15 mg by mouth in the morning.     traMADol (ULTRAM) 50 MG tablet Take 1 tablet (50 mg total) by mouth every 6 (six) hours as needed. 28 tablet 0   traMADol (ULTRAM) 50 MG tablet Take 1 tablet (50 mg total) by mouth every 6 (six) hours as needed. 30 tablet 0   No current facility-administered medications for this visit.      Physical Exam:   BP 120/74  (BP Location: Left Arm, Patient Position: Sitting, Cuff Size: Normal)   Pulse 66   Resp 20   Ht 6' (1.829 m)   Wt 107.5 kg   SpO2 96% Comment: RA  BMI 32.14 kg/m   General:  Well-appearing no acute distress  Chest:   Clear to auscultation  CV:   Regular rate and rhythm  Incisions:  Healing well  Abdomen:  Soft nontender  Extremities:  No edema  Diagnostic Tests:  Chest x-ray from today shows clear lung fields and stable mediastinal   Impression:  Doing well after CABG  Plan:  Follow-up as needed with thoracic surgery  I spent in excess of 10 minutes during the conduct of this office consultation and >50% of this time involved direct face-to-face encounter with the patient for counseling and/or coordination of their care.  Level 2                 10 minutes Level 3                 15 minutes Level 4  25 minutes Level 5                 40 minutes  B.  Lorayne Marek, MD 05/27/2021 11:36 AM

## 2021-05-28 LAB — HEPATIC FUNCTION PANEL
ALT: 62 IU/L — ABNORMAL HIGH (ref 0–44)
AST: 33 IU/L (ref 0–40)
Albumin: 4.7 g/dL (ref 3.8–4.9)
Alkaline Phosphatase: 120 IU/L (ref 44–121)
Bilirubin Total: 0.7 mg/dL (ref 0.0–1.2)
Bilirubin, Direct: 0.21 mg/dL (ref 0.00–0.40)
Total Protein: 6.9 g/dL (ref 6.0–8.5)

## 2021-05-28 LAB — LIPID PANEL
Chol/HDL Ratio: 3.1 ratio (ref 0.0–5.0)
Cholesterol, Total: 95 mg/dL — ABNORMAL LOW (ref 100–199)
HDL: 31 mg/dL — ABNORMAL LOW (ref >39)
LDL Chol Calc (NIH): 48 mg/dL (ref 0–99)
Triglycerides: 76 mg/dL (ref 0–149)
VLDL Cholesterol Cal: 16 mg/dL (ref 5–40)

## 2021-05-30 ENCOUNTER — Other Ambulatory Visit: Payer: Self-pay | Admitting: Physician Assistant

## 2021-06-01 ENCOUNTER — Other Ambulatory Visit: Payer: Self-pay

## 2021-06-01 DIAGNOSIS — I2510 Atherosclerotic heart disease of native coronary artery without angina pectoris: Secondary | ICD-10-CM

## 2021-06-01 DIAGNOSIS — Z79899 Other long term (current) drug therapy: Secondary | ICD-10-CM

## 2021-06-01 MED ORDER — ATORVASTATIN CALCIUM 40 MG PO TABS: 40.0000 mg | ORAL_TABLET | Freq: Every day | ORAL | Status: DC

## 2021-06-02 ENCOUNTER — Other Ambulatory Visit: Payer: Self-pay | Admitting: Physician Assistant

## 2021-06-18 ENCOUNTER — Other Ambulatory Visit: Payer: Self-pay | Admitting: Adult Health

## 2021-06-24 ENCOUNTER — Telehealth (HOSPITAL_COMMUNITY): Payer: Self-pay

## 2021-06-24 ENCOUNTER — Encounter (HOSPITAL_COMMUNITY): Payer: Self-pay

## 2021-06-24 NOTE — Telephone Encounter (Signed)
Attempted to call patient in regards to Cardiac Rehab - LM on VM Mailed letter 

## 2021-06-28 ENCOUNTER — Other Ambulatory Visit: Payer: Self-pay | Admitting: Physician Assistant

## 2021-07-04 ENCOUNTER — Other Ambulatory Visit: Payer: Self-pay | Admitting: Cardiology

## 2021-07-04 DIAGNOSIS — I2510 Atherosclerotic heart disease of native coronary artery without angina pectoris: Secondary | ICD-10-CM

## 2021-07-13 NOTE — Telephone Encounter (Signed)
No response from pt for cardiac rehab. Closed referral. 

## 2021-09-09 NOTE — Progress Notes (Deleted)
HPI: FU CAD. Cath 5/22 showed severe 3 vessel CAD. Carotid dopplers showed 1-39 bilateral stenosis. Echo showed noraml LV function; grade 1 DD. Had CABG with LIMA to LAD, RIMA to PDA, radial to OM2 and diagonal. Postop afib treated with amiodarone. Since last seen,   Current Outpatient Medications  Medication Sig Dispense Refill   amLODipine (NORVASC) 5 MG tablet Take 1 tablet (5 mg total) by mouth daily. 30 tablet 1   aspirin EC 325 MG EC tablet Take 1 tablet (325 mg total) by mouth daily.     atorvastatin (LIPITOR) 40 MG tablet Take 1 tablet (40 mg total) by mouth at bedtime.     benzonatate (TESSALON PERLES) 100 MG capsule Take 1 capsule (100 mg total) by mouth 3 (three) times daily as needed for cough. 20 capsule 0   Cholecalciferol (VITAMIN D3 MAXIMUM STRENGTH) 125 MCG (5000 UT) capsule Take 5,000 Units by mouth in the morning.     famotidine (PEPCID) 20 MG tablet Take 40 mg by mouth at bedtime.     JARDIANCE 25 MG TABS tablet Take 25 mg by mouth in the morning.     metFORMIN (GLUCOPHAGE) 500 MG tablet Take 2 tablets (1,000 mg total) by mouth 2 (two) times daily with a meal.     metoprolol tartrate (LOPRESSOR) 25 MG tablet Take 0.5 tablets (12.5 mg total) by mouth 2 (two) times daily. 30 tablet 1   Omega-3 Fatty Acids (FISH OIL) 1200 MG CAPS Take 1,200 mg by mouth in the morning.     pioglitazone (ACTOS) 15 MG tablet Take 15 mg by mouth in the morning.     traMADol (ULTRAM) 50 MG tablet Take 1 tablet (50 mg total) by mouth every 6 (six) hours as needed. 28 tablet 0   traMADol (ULTRAM) 50 MG tablet Take 1 tablet (50 mg total) by mouth every 6 (six) hours as needed. 30 tablet 0   No current facility-administered medications for this visit.     Past Medical History:  Diagnosis Date   Anginal pain (HCC)    Arthritis    Cellulitis of left hand 11/27/2015   Coronary artery disease    Diabetes mellitus without complication (HCC)    GERD (gastroesophageal reflux disease)     Hyperlipemia    Hypertension    Myocardial infarction (HCC)    Shingles    4-5 years ago   Tuberculosis    As a child    Past Surgical History:  Procedure Laterality Date   CARDIAC CATHETERIZATION N/A 11/30/2015   Procedure: Left Heart Cath and Coronary Angiography;  Surgeon: Kathleene Hazel, MD;  Location: [facility] INVASIVE CV LAB;  Service: Cardiovascular;  Laterality: N/A;   COLONOSCOPY     CORONARY ARTERY BYPASS GRAFT N/A 04/27/2021   Procedure: CORONARY ARTERY BYPASS GRAFTING (CABG) X FOUR,  USING LEFT INTERNAL MAMMARY ARTERY, RIGHT INTERNAL MAMMARY ARTERY, RIGHT RADIAL ARTERY CONDUITS;  Surgeon: Linden Dolin, MD;  Location: [facility] OR;  Service: Open Heart Surgery;  Laterality: N/A;   I & D EXTREMITY Right 11/26/2014   Procedure: INCISION AND DRAINAGE OF RIGHT INDEX FINGER;  Surgeon: Dairl Ponder, MD;  Location: [facility] OR;  Service: Orthopedics;  Laterality: Right;   INTRAVASCULAR PRESSURE WIRE/FFR STUDY N/A 04/15/2021   Procedure: INTRAVASCULAR PRESSURE WIRE/FFR STUDY;  Surgeon: Corky Crafts, MD;  Location: [facility] INVASIVE CV LAB;  Service: Cardiovascular;  Laterality: N/A;   LEFT HEART CATH AND CORONARY ANGIOGRAPHY N/A 04/15/2021   Procedure: LEFT HEART  CATH AND CORONARY ANGIOGRAPHY;  Surgeon: Corky Crafts, MD;  Location: [facility] INVAS[facility] CV LAB;  Service: Cardiovascular;  Laterality: N/A;   ORIF ANKLE FRACTURE  10/03/2012   Procedure: OPEN REDUCTION INTERNAL FIXATION (ORIF) ANKLE FRACTURE;  Surgeon: Nestor Lewandowsky, MD;  Location: [facility];  Service: Orthopedics;  Laterality: Left;   PLANTAR FASCIA SURGERY  06,07   both feet   RADIAL ARTERY HARVEST Right 04/27/2021   Procedure: possible RADIAL ARTERY HARVEST;  Surgeon: Linden Dolin, MD;  Location: [facility] OR;  Service: Open Heart Surgery;  Laterality: Right;   TEE WITHOUT CARDIOVERSION N/A 04/27/2021   Procedure: TRANSESOPHAGEAL ECHOCARDIOGRAM (TEE);  Surgeon: Linden Dolin, MD;  Location: [facility] OR;  Service: Open  Heart Surgery;  Laterality: N/A;   TONSILLECTOMY     as a child    Social History   Socioeconomic History   Marital status: Married    Spouse name: Not on file   Number of children: 2   Years of education: Not on file   Highest education level: Not on file  Occupational History   Occupation: Works 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  Tobacco Use   Smoking status: Never   Smokeless tobacco: Never  Vaping Use   Vaping Use: Never used  Substance and Sexual Activity   Alcohol use: Yes    Alcohol/week: 0.0 standard drinks    Comment: 6 to 12 beers per week   Drug use: No   Sexual activity: Not on file  Other Topics Concern   Not on file  Social History Narrative   Not on file   Social Determinants of Health   Financial Resource Strain: Not on file  Food Insecurity: Not on file  Transportation Needs: Not on file  Physical Activity: Not on file  Stress: Not on file  Social Connections: Not on file  Intimate Partner Violence: Not on file    Family History  Problem Relation Age of Onset   Colon cancer Neg Hx    CAD Other        No family history    ROS: no fevers or chills, productive cough, hemoptysis, dysphasia, odynophagia, melena, hematochezia, dysuria, hematuria, rash, seizure activity, orthopnea, PND, pedal edema, claudication. Remaining systems are negative.  Physical Exam: Well-developed well-nourished in no acute distress.  Skin is warm and dry.  HEENT is normal.  Neck is supple.  Chest is clear to auscultation with normal expansion.  Cardiovascular exam is regular rate and rhythm.  Abdominal exam nontender or distended. No masses palpated. Extremities show no edema. neuro grossly intact  ECG- personally reviewed  A/P  1 CAD s/p CABG-continue ASA and statin.   2 HTN-BP controlled; continue present meds and follow.  3 Hyperlipidemia-continue statin.   4 Postoperative atrial fibrillation-remains in sinus on exam; DC amiodarone.  Olga Millers, MD

## 2021-09-17 ENCOUNTER — Ambulatory Visit: Payer: BC Managed Care – PPO | Admitting: Cardiology

## 2021-10-19 ENCOUNTER — Other Ambulatory Visit: Payer: Self-pay | Admitting: Cardiology

## 2021-10-19 DIAGNOSIS — I2510 Atherosclerotic heart disease of native coronary artery without angina pectoris: Secondary | ICD-10-CM

## 2021-10-29 ENCOUNTER — Telehealth: Payer: Self-pay | Admitting: Cardiology

## 2021-10-29 ENCOUNTER — Other Ambulatory Visit: Payer: Self-pay | Admitting: Cardiology

## 2021-10-29 DIAGNOSIS — I2510 Atherosclerotic heart disease of native coronary artery without angina pectoris: Secondary | ICD-10-CM

## 2021-10-29 NOTE — Telephone Encounter (Signed)
*  STAT* If patient is at the pharmacy, call can be transferred to refill team.   1. Which medications need to be refilled? (please list name of each medication and dose if known) atorvastatin (LIPITOR) 40 MG tablet  2. Which pharmacy/location (including street and city if local pharmacy) is medication to be sent to? [id number] - [place], [place] - [address]  3. Do they need a 30 day or 90 day supply? 90

## 2021-10-29 NOTE — Telephone Encounter (Signed)
Medication was sent to pts pharmacy by previous nurse. See pts med list for details.

## 2022-01-25 ENCOUNTER — Other Ambulatory Visit: Payer: Self-pay

## 2022-01-25 DIAGNOSIS — I2510 Atherosclerotic heart disease of native coronary artery without angina pectoris: Secondary | ICD-10-CM

## 2022-01-26 ENCOUNTER — Other Ambulatory Visit: Payer: Self-pay | Admitting: Cardiology

## 2022-01-26 DIAGNOSIS — I2510 Atherosclerotic heart disease of native coronary artery without angina pectoris: Secondary | ICD-10-CM

## 2022-01-26 NOTE — Telephone Encounter (Signed)
Atorvastatin (Lipitor) 80 MG is not the appropriate MG. ?

## 2022-02-02 ENCOUNTER — Other Ambulatory Visit: Payer: Self-pay | Admitting: Cardiology

## 2022-02-02 DIAGNOSIS — I2510 Atherosclerotic heart disease of native coronary artery without angina pectoris: Secondary | ICD-10-CM

## 2022-02-04 ENCOUNTER — Telehealth: Payer: Self-pay

## 2022-02-04 DIAGNOSIS — I2510 Atherosclerotic heart disease of native coronary artery without angina pectoris: Secondary | ICD-10-CM

## 2022-02-04 MED ORDER — ATORVASTATIN CALCIUM 80 MG PO TABS: 80.0000 mg | ORAL_TABLET | Freq: Every day | ORAL | 0 refills | Status: DC

## 2022-02-04 NOTE — Telephone Encounter (Signed)
Patient walked in office requesting Lipitor 80 mg refill.Advised he is due for a follow up appointment with Dr.Crenshaw.Patient went to check out to schedule appointment.Refill sent to pharmacy. ?

## 2022-03-02 ENCOUNTER — Other Ambulatory Visit: Payer: Self-pay | Admitting: Physician Assistant

## 2022-03-08 ENCOUNTER — Other Ambulatory Visit: Payer: Self-pay | Admitting: Physician Assistant

## 2022-04-04 ENCOUNTER — Other Ambulatory Visit: Payer: Self-pay | Admitting: Adult Health

## 2022-04-07 ENCOUNTER — Telehealth: Payer: Self-pay | Admitting: Cardiology

## 2022-04-07 NOTE — Telephone Encounter (Signed)
Pt c/o medication issue:  1. Name of Medication:  atorvastatin (LIPITOR) 80 MG tablet metoprolol tartrate (LOPRESSOR) 25 MG tablet  2. How are you currently taking this medication (dosage and times per day)? As directed  3. Are you having a reaction (difficulty breathing--STAT)? no  4. What is your medication issue? Patient's wife said the Pharmacy is going to charge him more for a 30 day supply. He is scheduled for an appointment 05/06/22 but would like help to get the medication at the discounted price

## 2022-04-07 NOTE — Telephone Encounter (Signed)
Called patient; left message to call back.

## 2022-04-12 ENCOUNTER — Other Ambulatory Visit: Payer: Self-pay

## 2022-04-12 DIAGNOSIS — I2510 Atherosclerotic heart disease of native coronary artery without angina pectoris: Secondary | ICD-10-CM

## 2022-04-12 MED ORDER — ATORVASTATIN CALCIUM 80 MG PO TABS: 80.0000 mg | ORAL_TABLET | Freq: Every day | ORAL | 0 refills | Status: DC

## 2022-04-12 MED ORDER — ATORVASTATIN CALCIUM 80 MG PO TABS: ORAL_TABLET | ORAL | 0 refills | Status: DC

## 2022-04-14 ENCOUNTER — Other Ambulatory Visit: Payer: Self-pay

## 2022-04-14 DIAGNOSIS — I2510 Atherosclerotic heart disease of native coronary artery without angina pectoris: Secondary | ICD-10-CM

## 2022-04-14 MED ORDER — METOPROLOL TARTRATE 25 MG PO TABS: ORAL_TABLET | ORAL | 0 refills | Status: DC

## 2022-04-14 MED ORDER — ATORVASTATIN CALCIUM 80 MG PO TABS: ORAL_TABLET | ORAL | 0 refills | Status: DC

## 2022-04-14 NOTE — Telephone Encounter (Signed)
LMTCB

## 2022-04-14 NOTE — Telephone Encounter (Signed)
Patient returning call.

## 2022-04-14 NOTE — Telephone Encounter (Signed)
Patient stated he has been out of medication for about 1 week. He wants refills on atorvastatin 80 mg daily and metoprolol tartrate 12.5 mg bid. Sent in enough medication until his appointment with Arnold Long on 05/06/22

## 2022-04-15 ENCOUNTER — Telehealth: Payer: Self-pay | Admitting: Cardiology

## 2022-04-15 NOTE — Telephone Encounter (Signed)
New Message:      Please call, concerning his medicine. He says all of his medicine have to be called in a certain way, if not, his insurance will not pay.

## 2022-04-15 NOTE — Telephone Encounter (Signed)
Called pt who reports needs new prescription for metoprolol tartrate 25 mg and atorvastatin 80 sent to pharmacy.  I advised pt that medications were sent to pharmacy on 04/14/22.  Pt reports went to pharmacy and was offered 5 tablets of atorvastatin 80 for $26 dollars.  I called [facility] to inquire on status of pt medication.  Pharmacy tech reports on 04/08/22 5 tablets of metoprolol and 5 tablets of atorvastatin were picked up by pt.  When asked about new prescription that was sent in on 04/14/22 was told medication is not covered.  I called pt back and advised this issue is between pharmacy and insurance.  Our office has sent in a new script metoprolol #12 tablets and atorvastatin #24.  Pt will f/u with pharmacy and insurance.

## 2022-05-05 NOTE — Progress Notes (Signed)
Cardiology Clinic Note   Patient Name: Jacob Downs Date of Encounter: 05/06/2022  Primary Care Provider:  Catha Gosselin, MD Primary Cardiologist:  Jacob Millers, MD  Patient Profile    61 year old male with history of CAD, status post CABG X  4 by Dr. Vickey Downs on 04/27/2021 (LIMA to LAD, pedicled RIMA to PDA, right radial artery to second of twos marginal branch of left circumflex, and second diagonal branch as a sequenced graft-open right radial artery harvesting).  Hypertension, hyperlipidemia, arthritis, and GERD.  Last seen in the office by Jacob Downs on 05/17/2021.   Past Medical History    Past Medical History:  Diagnosis Date   Anginal pain (HCC)    Arthritis    Cellulitis of left hand 11/27/2015   Coronary artery disease    Diabetes mellitus without complication (HCC)    GERD (gastroesophageal reflux disease)    Hyperlipemia    Hypertension    Myocardial infarction (HCC)    Shingles    4-5 years ago   Tuberculosis    As a child   Past Surgical History:  Procedure Laterality Date   CARDIAC CATHETERIZATION N/A 11/30/2015   Procedure: Left Heart Cath and Coronary Angiography;  Surgeon: Jacob Hazel, MD;  Location: [facility] INVASIVE CV LAB;  Service: Cardiovascular;  Laterality: N/A;   COLONOSCOPY     CORONARY ARTERY BYPASS GRAFT N/A 04/27/2021   Procedure: CORONARY ARTERY BYPASS GRAFTING (CABG) X FOUR,  USING LEFT INTERNAL MAMMARY ARTERY, RIGHT INTERNAL MAMMARY ARTERY, RIGHT RADIAL ARTERY CONDUITS;  Surgeon: Jacob Dolin, MD;  Location: [facility] OR;  Service: Open Heart Surgery;  Laterality: N/A;   I & D EXTREMITY Right 11/26/2014   Procedure: INCISION AND DRAINAGE OF RIGHT INDEX FINGER;  Surgeon: Jacob Ponder, MD;  Location: [facility] OR;  Service: Orthopedics;  Laterality: Right;   INTRAVASCULAR PRESSURE WIRE/FFR STUDY N/A 04/15/2021   Procedure: INTRAVASCULAR PRESSURE WIRE/FFR STUDY;  Surgeon: Jacob Crafts, MD;  Location: [facility] INVASIVE CV LAB;  Service:  Cardiovascular;  Laterality: N/A;   LEFT HEART CATH AND CORONARY ANGIOGRAPHY N/A 04/15/2021   Procedure: LEFT HEART CATH AND CORONARY ANGIOGRAPHY;  Surgeon: Jacob Crafts, MD;  Location: [facility] INVASIVE CV LAB;  Service: Cardiovascular;  Laterality: N/A;   ORIF ANKLE FRACTURE  10/03/2012   Procedure: OPEN REDUCTION INTERNAL FIXATION (ORIF) ANKLE FRACTURE;  Surgeon: Jacob Lewandowsky, MD;  Location: [facility];  Service: Orthopedics;  Laterality: Left;   PLANTAR FASCIA SURGERY  06,07   both feet   RADIAL ARTERY HARVEST Right 04/27/2021   Procedure: possible RADIAL ARTERY HARVEST;  Surgeon: Jacob Dolin, MD;  Location: [facility] OR;  Service: Open Heart Surgery;  Laterality: Right;   TEE WITHOUT CARDIOVERSION N/A 04/27/2021   Procedure: TRANSESOPHAGEAL ECHOCARDIOGRAM (TEE);  Surgeon: Jacob Dolin, MD;  Location: [facility] OR;  Service: Open Heart Surgery;  Laterality: N/A;   TONSILLECTOMY     as a child    Allergies  Allergies  Allergen Reactions   Glimepiride Other (See Comments)    hypoglycemia    History of Present Illness    [name] presents today for ongoing assessment and management of coronary artery disease, MI, status post four-vessel CABG 05/10/2021, hypertension, hyperlipidemia, and need for medication refills.  He comes today without any cardiac complaints.  He states he has generalized fatigue but is very busy during the day going up and down stairs and walking a lot, and so by the end of the day he feels very  tired.  He denies any angina symptoms.  He is medically compliant.  He has been out of atorvastatin for approximately 1 week and requires refills.  Otherwise he has been medically compliant.  Home Medications    Current Outpatient Medications  Medication Sig Dispense Refill   amLODipine (NORVASC) 5 MG tablet Take 1 tablet (5 mg total) by mouth daily. 30 tablet 1   aspirin EC 325 MG EC tablet Take 1 tablet (325 mg total) by mouth daily.     atorvastatin  (LIPITOR) 80 MG tablet TAKE 1 TABLET(80 MG) BY MOUTH DAILY 24 tablet 0   Cholecalciferol (VITAMIN D3 MAXIMUM STRENGTH) 125 MCG (5000 UT) capsule Take 5,000 Units by mouth in the morning.     famotidine (PEPCID) 20 MG tablet Take 40 mg by mouth at bedtime.     JARDIANCE 25 MG TABS tablet Take 25 mg by mouth in the morning.     metFORMIN (GLUCOPHAGE) 500 MG tablet Take 2 tablets (1,000 mg total) by mouth 2 (two) times daily with a meal.     metoprolol tartrate (LOPRESSOR) 25 MG tablet PATIENT MUST KEEP SCHEDULE APPOINTMENT FOR FUTURE REFILLS TAKE 1/2 TABLET BY MOUTH TWICE DAILY 12 tablet 0   Omega-3 Fatty Acids (FISH OIL) 1200 MG CAPS Take 1,200 mg by mouth in the morning.     No current facility-administered medications for this visit.     Family History    Family History  Problem Relation Age of Onset   Colon cancer Neg Hx    CAD Other        No family history   He indicated that the status of his neg hx is unknown. He indicated that the status of his other is unknown.  Social History    Social History   Socioeconomic History   Marital status: Married    Spouse name: Not on file   Number of children: 2   Years of education: Not on file   Highest education level: Not on file  Occupational History   Occupation: Works 6p-6a  Tobacco Use   Smoking status: Never   Smokeless tobacco: Never  Vaping Use   Vaping Use: Never used  Substance and Sexual Activity   Alcohol use: Yes    Alcohol/week: 0.0 standard drinks of alcohol    Comment: 6 to 12 beers per week   Drug use: No   Sexual activity: Not on file  Other Topics Concern   Not on file  Social History Narrative   Not on file   Social Determinants of Health   Financial Resource Strain: Not on file  Food Insecurity: Not on file  Transportation Needs: Not on file  Physical Activity: Not on file  Stress: Not on file  Social Connections: Not on file  Intimate Partner Violence: Not on file     Review of Systems     General:  No chills, fever, night sweats or weight changes.  Cardiovascular:  No chest pain, dyspnea on exertion, edema, orthopnea, palpitations, paroxysmal nocturnal dyspnea. Dermatological: No rash, lesions/masses Respiratory: No cough, dyspnea Urologic: No hematuria, dysuria Abdominal:   No nausea, vomiting, diarrhea, bright red blood per rectum, melena, or hematemesis Neurologic:  No visual changes, wkns, changes in mental status. All other systems reviewed and are otherwise negative except as noted above.     Physical Exam    VS:  BP 136/70 (BP Location: Left Arm)   Pulse 67   Ht 6' (1.829 m)   Wt 252  lb 3.2 oz (114.4 kg)   SpO2 96%   BMI 34.20 kg/m  , BMI Body mass index is 34.2 kg/m.     GEN: Well nourished, well developed, in no acute distress.  Obese HEENT: normal. Neck: Supple, no JVD, carotid bruits, or masses. Cardiac: RRR, no murmurs, rubs, or gallops. No clubbing, cyanosis, edema.  Radials/DP/PT 2+ and equal bilaterally.  Respiratory:  Respirations regular and unlabored, clear to auscultation bilaterally. GI: Soft, nontender, nondistended, BS + x 4. MS: no deformity or atrophy. Skin: warm and dry, no rash. Neuro:  Strength and sensation are intact. Psych: Normal affect.  Accessory Clinical Findings      Lab Results  Component Value Date   WBC 6.1 04/30/2021   HGB 11.5 (L) 04/30/2021   HCT 34.3 (L) 04/30/2021   MCV 99.4 04/30/2021   PLT 139 (L) 04/30/2021   Lab Results  Component Value Date   CREATININE 0.60 (L) 05/02/2021   BUN 9 05/02/2021   NA 140 05/02/2021   K 3.4 (L) 05/02/2021   CL 99 05/02/2021   CO2 35 (H) 05/02/2021   Lab Results  Component Value Date   ALT 62 (H) 05/28/2021   AST 33 05/28/2021   ALKPHOS 120 05/28/2021   BILITOT 0.7 05/28/2021   Lab Results  Component Value Date   CHOL 95 (L) 05/28/2021   HDL 31 (L) 05/28/2021   LDLCALC 48 05/28/2021   TRIG 76 05/28/2021   CHOLHDL 3.1 05/28/2021    Lab Results  Component  Value Date   HGBA1C 7.9 (H) 04/23/2021    Review of Prior Studies: Echocardiogram 04/23/2021  1. Left ventricular ejection fraction, by estimation, is 60 to 65%. The  left ventricle has normal function. Left ventricular endocardial border  not optimally defined to evaluate regional wall motion. Left ventricular  diastolic parameters are consistent  with Grade I diastolic dysfunction (impaired relaxation).   2. Right ventricular systolic function is normal. The right ventricular  size is normal. Tricuspid regurgitation signal is inadequate for assessing  PA pressure.   3. The mitral valve is grossly normal. Trivial mitral valve  regurgitation. No evidence of mitral stenosis.   4. The aortic valve is tricuspid. Aortic valve regurgitation is not  visualized. No aortic stenosis is present.   5. The inferior vena cava is normal in size with greater than 50%  respiratory variability, suggesting right atrial pressure of 3 mmHg.   Left Heart Cath 04/15/2021 Ost LAD to Prox LAD lesion is 20% stenosed. Prox LAD to Mid LAD lesion is 90% stenosed. Mid LAD lesion is 70% stenosed. 1st Diag-1 lesion is 75% stenosed. 1st Diag-2 lesion is 75% stenosed. 2nd Mrg lesion is 75% stenosed. 1st Mrg lesion is 20% stenosed. Prox RCA to Dist RCA lesion is 40% stenosed. RPAV lesion is 30% stenosed. Mid Cx lesion is 80% stenosed. Ost Cx to Prox Cx lesion is 25% stenosed. Prox RCA lesion is 60% stenosed. Significant by FFR. The left ventricular systolic function is normal. LV end diastolic pressure is normal. The left ventricular ejection fraction is 55-65% by visual estimate. There is no aortic valve stenosis.   Severe disease in the heavily calcified LAD including bifurcation disease with large diagonal. Bifurcation lesion at the mid circumflex, OM.    RCA with angiographically moderate disease.  Heavily calcified.  FFR 0.77 after IV adenosine.    Plan for cardiac surgery consult for multivessel CAD.   He will needs grafts to LAD, large diagonal, OM2, OM3 and PDA.  CABG X 4 04/27/2021 Coronary Artery Bypass Grafting x 4             Left Internal Mammary Artery to Distal Left Anterior Descending Coronary Artery; pedicled right internal mammary artery to posterior Descending Coronary Artery; right radial artery graft to second obtuse Marginal Branch of Left Circumflex Coronary Artery and second diagonal Branch Coronary Artery as a sequenced graft  Open right radial artery harvesting Bilateral internal mammary artery harvesting Completion graft surveillance with indocyanine green fluorescence imaging by Dr. Vickey Downs   Assessment & Plan   1.  Coronary artery disease: Status post CABG x4 by Dr. Vickey Downs on 04/27/2021 (LIMA to LAD, pedicled RIMA to PDA, right radial artery to second of 2 marginal branches of the left circumflex, and second diagonal branch icicle sequential graft open right radial artery harvesting).  He remains on secondary prevention with blood pressure control heart rate control and statin therapy.  He is without complaints of angina symptoms.  No changes in his medication regimen.  We will order a CMET.  2.  Hypertension: Blood pressure is well controlled currently.  He will receive refills on amlodipine and metoprolol 25 mg (1/2 tablet twice daily).  He is advised on a low-sodium diet and weight loss for better health and management.  3.  Hyperlipidemia: Currently on atorvastatin 80 mg daily.  He has been out of this approximately 1 week.  Refills are provided.  He is to have fasting lipid profile in 3 days as he has eaten today.  He will come by the office and have these drawn.  More recommendations based upon results.  He is to continue a low-cholesterol diet, although he states he does eat some fast food.  I have advised him to eat more healthfully.  4.  Type 2 diabetes: Managed by primary care.  Would recommend weight loss and increase exercise for management.  Current medicines are  reviewed at length with the patient today.  I have spent 25 min's  dedicated to the care of this patient on the date of this encounter to include pre-visit review of records, assessment, management and diagnostic testing,with shared decision making. Signed, Bettey Mare. Liborio Nixon, ANP, AACC   05/06/2022 3:38 PM    [facility] [address] Office [phone] Fax [phone]  Notice: This dictation was prepared with Dragon dictation along with smaller phrase technology. Any transcriptional errors that result from this process are unintentional and may not be corrected upon review.

## 2022-05-06 ENCOUNTER — Ambulatory Visit (INDEPENDENT_AMBULATORY_CARE_PROVIDER_SITE_OTHER): Payer: BC Managed Care – PPO | Admitting: Adult Health

## 2022-05-06 ENCOUNTER — Encounter: Payer: Self-pay | Admitting: Adult Health

## 2022-05-06 VITALS — BP 136/70 | HR 67 | Ht 72.0 in | Wt 252.2 lb

## 2022-05-06 DIAGNOSIS — Z951 Presence of aortocoronary bypass graft: Secondary | ICD-10-CM

## 2022-05-06 DIAGNOSIS — E118 Type 2 diabetes mellitus with unspecified complications: Secondary | ICD-10-CM

## 2022-05-06 DIAGNOSIS — E785 Hyperlipidemia, unspecified: Secondary | ICD-10-CM | POA: Diagnosis not present

## 2022-05-06 DIAGNOSIS — I2510 Atherosclerotic heart disease of native coronary artery without angina pectoris: Secondary | ICD-10-CM

## 2022-05-06 DIAGNOSIS — I10 Essential (primary) hypertension: Secondary | ICD-10-CM | POA: Diagnosis not present

## 2022-05-06 MED ORDER — ATORVASTATIN CALCIUM 80 MG PO TABS: ORAL_TABLET | ORAL | 0 refills | Status: DC

## 2022-05-06 MED ORDER — AMLODIPINE BESYLATE 5 MG PO TABS: 5.0000 mg | ORAL_TABLET | Freq: Every day | ORAL | 3 refills | Status: DC

## 2022-05-06 MED ORDER — METOPROLOL TARTRATE 25 MG PO TABS: 12.5000 mg | ORAL_TABLET | Freq: Two times a day (BID) | ORAL | 2 refills | Status: DC

## 2022-05-06 NOTE — Patient Instructions (Addendum)
Medication Instructions:  No Changes *If you need a refill on your cardiac medications before your next appointment, please call your pharmacy*   Lab Work: CMET, Lipid Panel If you have labs (blood work) drawn today and your tests are completely normal, you will receive your results only by: MyChart Message (if you have MyChart) OR A paper copy in the mail If you have any lab test that is abnormal or we need to change your treatment, we will call you to review the results.   Testing/Procedures: No Testing   Follow-Up: At [facility], you and your health needs are our priority.  As part of our continuing mission to provide you with exceptional heart care, we have created designated Provider Care Teams.  These Care Teams include your primary Cardiologist (physician) and Advanced Practice Providers (APPs -  Physician Assistants and Nurse Practitioners) who all work together to provide you with the care you need, when you need it.  We recommend signing up for the patient portal called "MyChart".  Sign up information is provided on this After Visit Summary.  MyChart is used to connect with patients for Virtual Visits (Telemedicine).  Patients are able to view lab/test results, encounter notes, upcoming appointments, etc.  Non-urgent messages can be sent to your provider as well.   To learn more about what you can do with MyChart, go to [email or web address].    Your next appointment:   1 year(s)  The format for your next appointment:   In Person  Provider:   Olga Millers, MD      Important Information About Sugar

## 2022-05-08 ENCOUNTER — Other Ambulatory Visit: Payer: Self-pay | Admitting: General Practice

## 2022-05-09 LAB — COMPREHENSIVE METABOLIC PANEL
ALT: 40 IU/L (ref 0–44)
AST: 21 IU/L (ref 0–40)
Albumin/Globulin Ratio: 2.3 — ABNORMAL HIGH (ref 1.2–2.2)
Albumin: 4.5 g/dL (ref 3.8–4.8)
Alkaline Phosphatase: 97 IU/L (ref 44–121)
BUN/Creatinine Ratio: 18 (ref 10–24)
BUN: 12 mg/dL (ref 8–27)
Bilirubin Total: 0.4 mg/dL (ref 0.0–1.2)
CO2: 28 mmol/L (ref 20–29)
Calcium: 9.8 mg/dL (ref 8.6–10.2)
Chloride: 99 mmol/L (ref 96–106)
Creatinine, Ser: 0.67 mg/dL — ABNORMAL LOW (ref 0.76–1.27)
Globulin, Total: 2 g/dL (ref 1.5–4.5)
Glucose: 178 mg/dL — ABNORMAL HIGH (ref 70–99)
Potassium: 5.4 mmol/L — ABNORMAL HIGH (ref 3.5–5.2)
Sodium: 140 mmol/L (ref 134–144)
Total Protein: 6.5 g/dL (ref 6.0–8.5)
eGFR: 106 mL/min/{1.73_m2} (ref >59)

## 2022-05-09 LAB — LIPID PANEL
Chol/HDL Ratio: 4.2 ratio (ref 0.0–5.0)
Cholesterol, Total: 224 mg/dL — ABNORMAL HIGH (ref 100–199)
HDL: 53 mg/dL (ref >39)
LDL Chol Calc (NIH): 149 mg/dL — ABNORMAL HIGH (ref 0–99)
Triglycerides: 123 mg/dL (ref 0–149)
VLDL Cholesterol Cal: 22 mg/dL (ref 5–40)

## 2022-05-12 ENCOUNTER — Telehealth: Payer: Self-pay

## 2022-05-12 ENCOUNTER — Other Ambulatory Visit: Payer: Self-pay

## 2022-05-12 DIAGNOSIS — E785 Hyperlipidemia, unspecified: Secondary | ICD-10-CM

## 2022-05-12 NOTE — Telephone Encounter (Addendum)
Called patient regarding results. Left detailed message.----- Message from Jodelle Gross, NP sent at 05/12/2022  3:48 PM EDT ----- His lipid levels are elevated but he was out of his atorvastatin. It was refilled on the last appointment. He will need to have repeat labs in 3 months,fasting lipids and LFT's to check his status. Otherwise, blood glucose is elevated, but is being followed by PCP.    Thanks, Lovey Newcomer

## 2022-06-25 IMAGING — DX DG CHEST 1V PORT
1 series · 1 of 1 positions shown · non-contrast
Comparison: April 23, 2021

CLINICAL DATA: Evaluate for pneumothorax.

EXAM:
PORTABLE CHEST 1 VIEW

[chest ap]
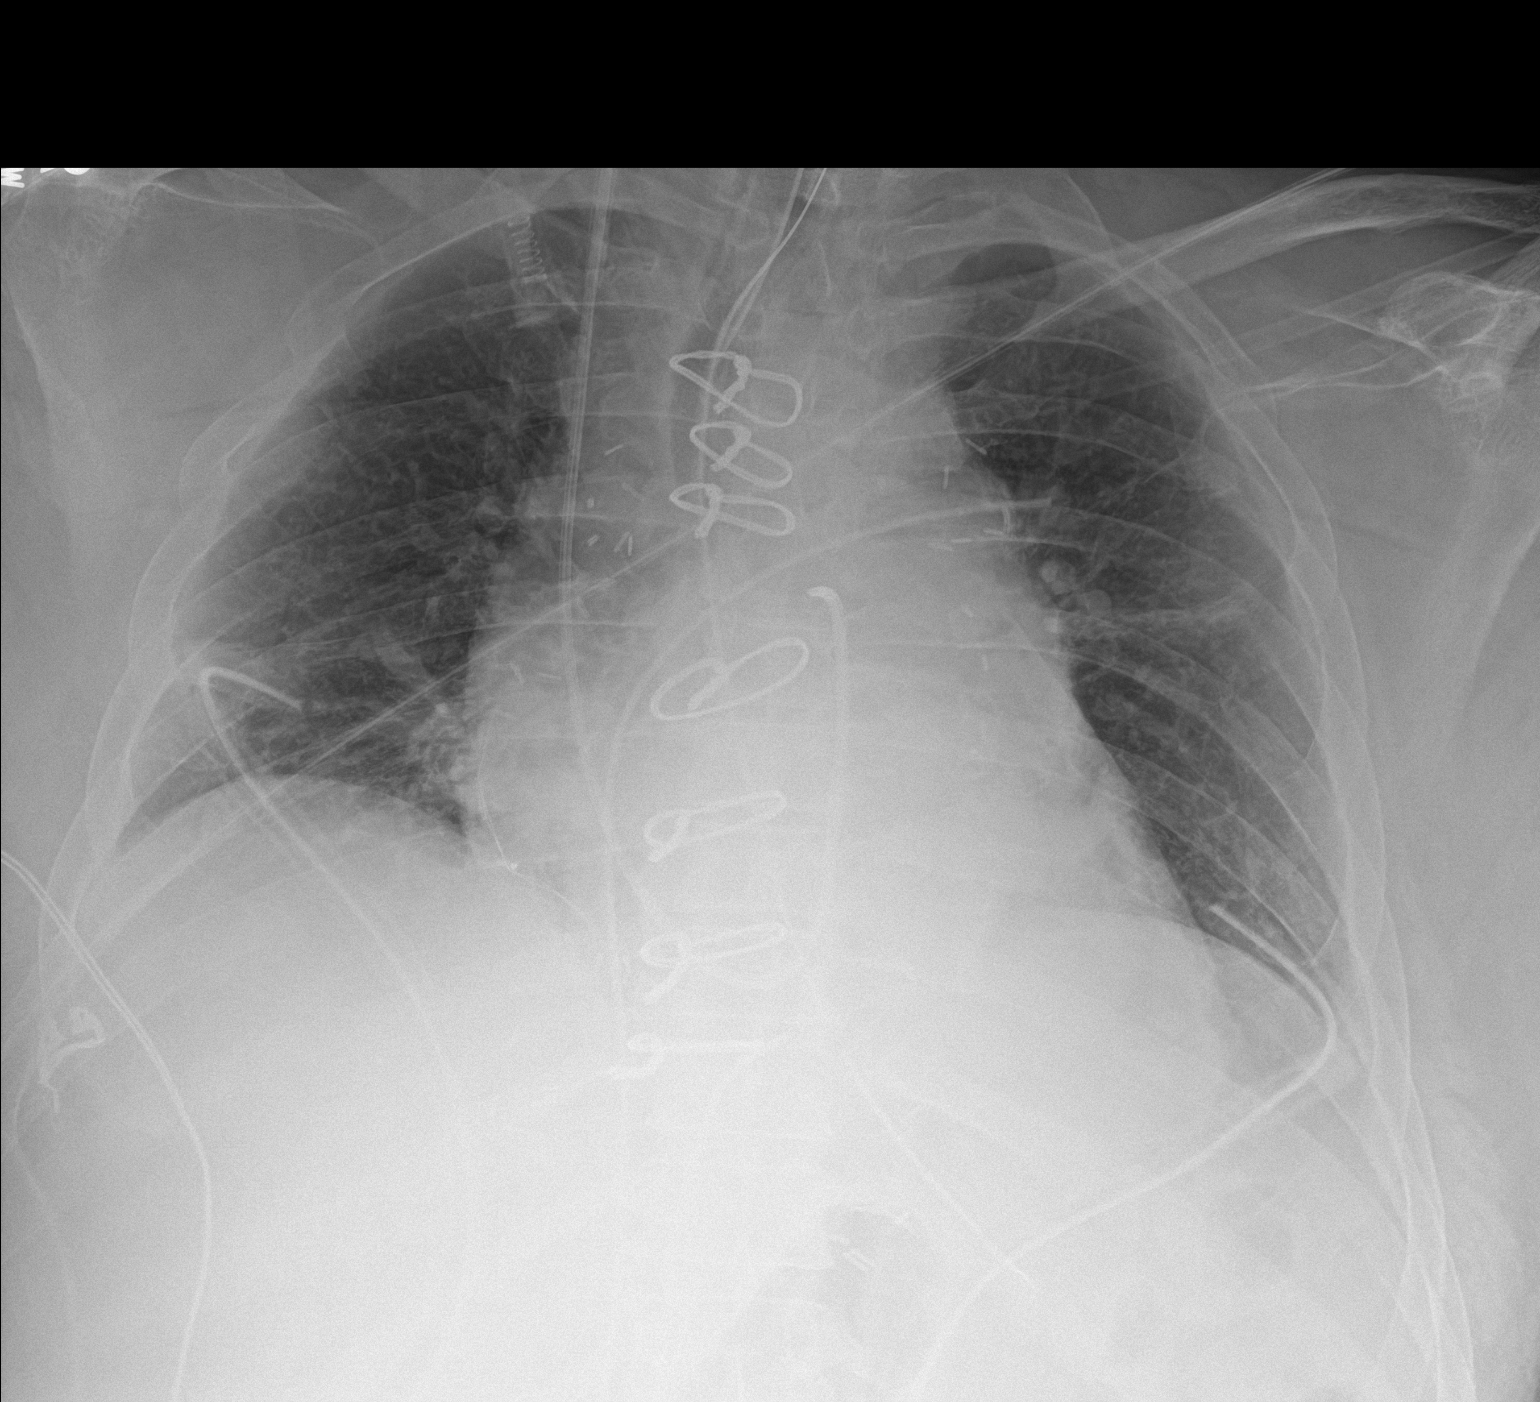

[1 of 1 positions shown; findings below may reference images not displayed]

FINDINGS: An endotracheal tube is seen with its distal tip approximately
cm from the carina. A nasogastric tube is noted with its distal end
seen within the body of the stomach. The distal side hole is
approximately 2.7 cm distal to the gastroesophageal junction. A
Swan-Ganz catheter is seen with its distal tip overlying the
expected region of the left pulmonary artery. Bilateral chest tubes
are in place. Multiple sternal wires are noted. Mild atelectasis is
seen within the right lung base and mid left lung. There is no
evidence of a pleural effusion or pneumothorax. The heart size and
mediastinal contours are within normal limits. The visualized
skeletal structures are unremarkable.
IMPRESSION: 1. Interval median sternotomy/CABG since the prior study with
multiple support line placement positioning, as described above.

## 2022-06-26 IMAGING — DX DG CHEST 1V PORT
1 series · 1 of 1 positions shown · non-contrast
Comparison: Chest x-ray 04/27/2021

CLINICAL DATA: Chest tube preset s/p open heart surg sore chest

EXAM:
PORTABLE CHEST 1 VIEW

[chest]
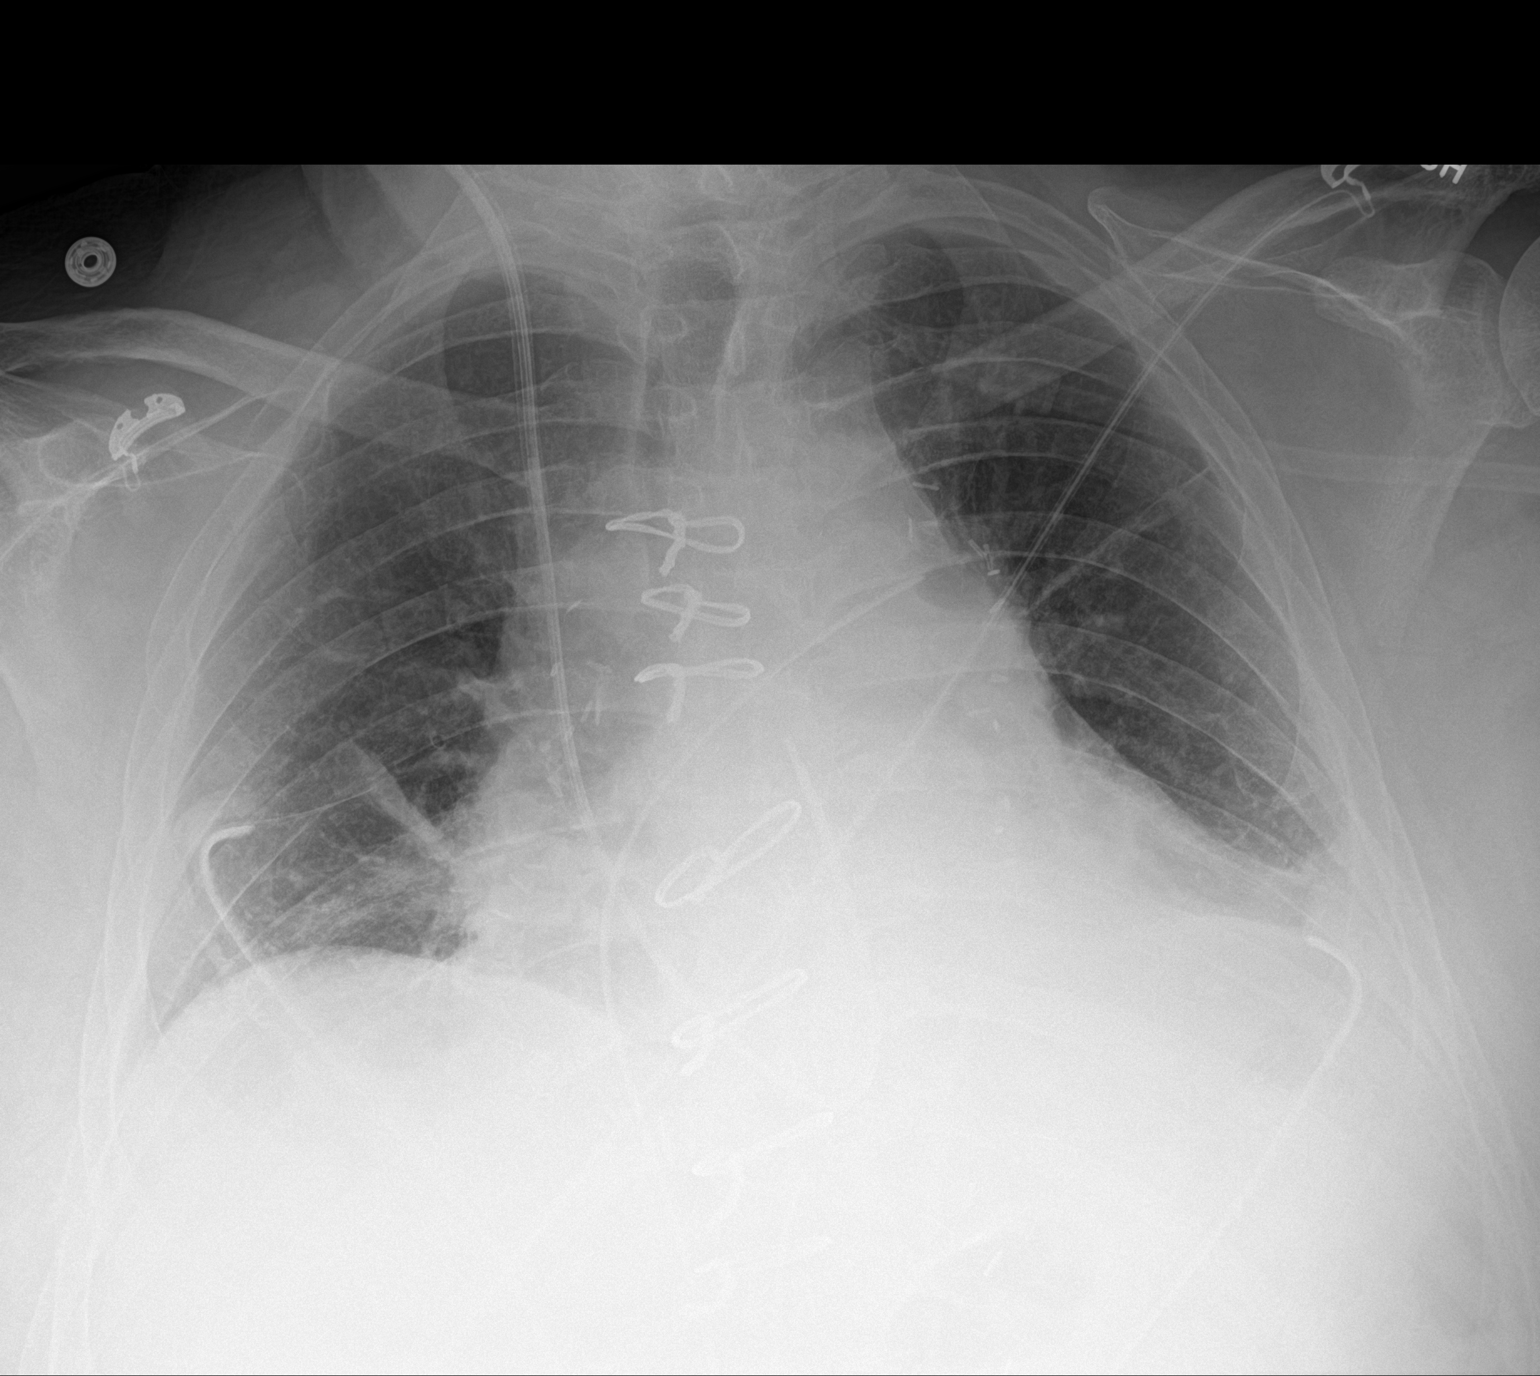

[1 of 1 positions shown; findings below may reference images not displayed]

FINDINGS: Interval slight retraction of a right internal jugular vein approach
Swan-Ganz catheter with tip overlying the expected region of the
main pulmonary artery. Mediastinal drain again noted. Bilateral
chest tubes in stable position. Interval removal of an enteric tube.
Interval removal of an endotracheal tube

Enlarged cardiac silhouette. The heart size and mediastinal contours
are unchanged. Cardiac surgical changes overlie the mediastinum.

Atelectasis at the right base. No focal consolidation. No pulmonary
edema. Interval development of a trace left pleural effusion. No
pneumothorax.

No acute osseous abnormality.
IMPRESSION: 1. Interval development of a trace left pleural effusion.
2. Interval removal of enteric and endotracheal tube.
3. Interval slight retraction of a right internal jugular vein
approach Swan-Ganz catheter with tip overlying the expected region
of the main pulmonary artery.

## 2022-06-28 IMAGING — DX DG CHEST 1V PORT
1 series · 1 of 1 positions shown · non-contrast
Comparison: April 29, 2021

CLINICAL DATA: Chest pain

EXAM:
PORTABLE CHEST 1 VIEW

[chest]
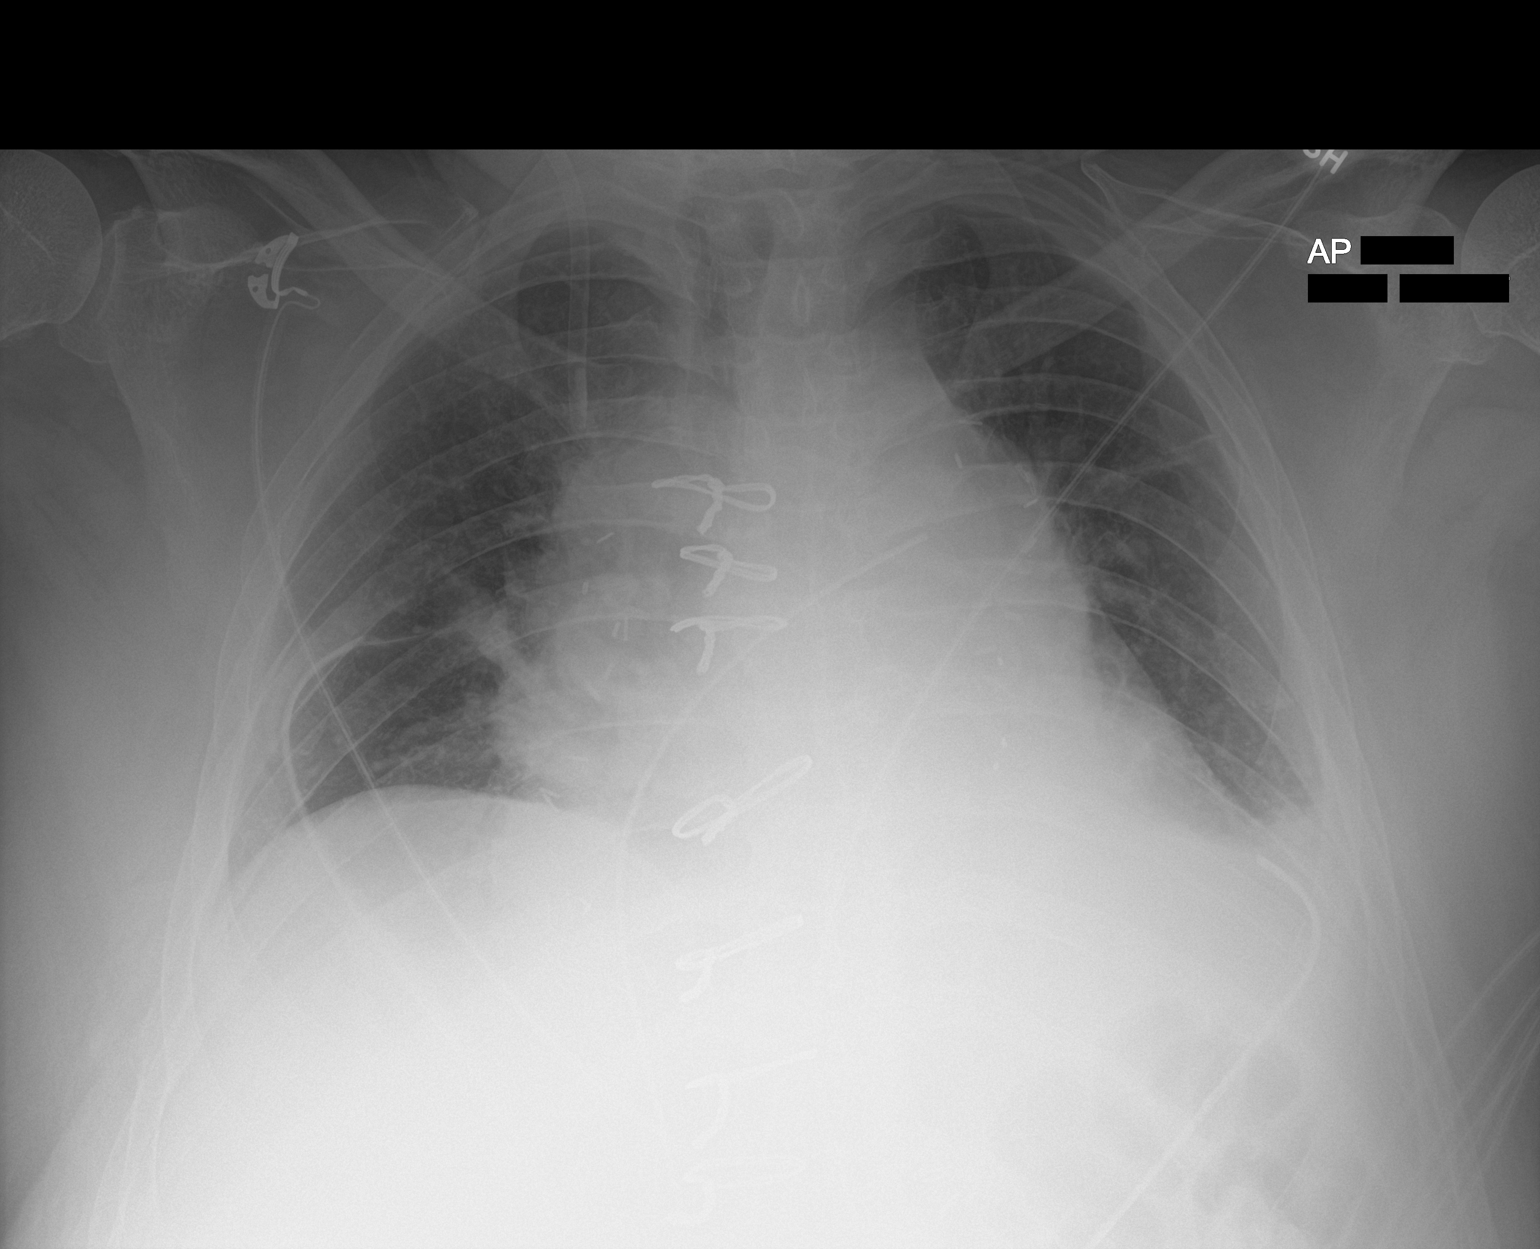

[1 of 1 positions shown; findings below may reference images not displayed]

FINDINGS: Cordis tip is in the superior vena cava. There is a chest tube on
each side. There is also a mediastinal drain. No appreciable
pneumothorax. There is a small left pleural effusion. There is
atelectatic change in the left mid lung and medial bibasilar
regions. No new opacity evident. Heart is mildly enlarged with
pulmonary vascularity stable and within normal limits. Status post
median sternotomy. No bone lesions.
IMPRESSION: Tube and catheter positions as described. No evident pneumothorax.
Small left pleural effusion. Atelectasis left mid lung and bibasilar
regions.

## 2022-06-29 ENCOUNTER — Other Ambulatory Visit: Payer: Self-pay | Admitting: Adult Health

## 2022-06-29 DIAGNOSIS — I2510 Atherosclerotic heart disease of native coronary artery without angina pectoris: Secondary | ICD-10-CM

## 2023-04-07 ENCOUNTER — Other Ambulatory Visit: Payer: Self-pay | Admitting: Adult Health

## 2023-04-24 ENCOUNTER — Other Ambulatory Visit: Payer: Self-pay | Admitting: Adult Health

## 2023-04-24 DIAGNOSIS — I2510 Atherosclerotic heart disease of native coronary artery without angina pectoris: Secondary | ICD-10-CM

## 2023-05-25 ENCOUNTER — Other Ambulatory Visit: Payer: Self-pay | Admitting: Adult Health

## 2023-05-25 DIAGNOSIS — I2510 Atherosclerotic heart disease of native coronary artery without angina pectoris: Secondary | ICD-10-CM

## 2023-06-18 ENCOUNTER — Other Ambulatory Visit: Payer: Self-pay | Admitting: Cardiology

## 2023-06-26 ENCOUNTER — Other Ambulatory Visit: Payer: Self-pay | Admitting: Adult Health

## 2023-07-29 ENCOUNTER — Other Ambulatory Visit: Payer: Self-pay | Admitting: Adult Health

## 2023-08-02 ENCOUNTER — Encounter: Payer: Self-pay | Admitting: Family Medicine

## 2023-08-09 ENCOUNTER — Other Ambulatory Visit: Payer: Self-pay | Admitting: Cardiology

## 2023-09-20 ENCOUNTER — Other Ambulatory Visit: Payer: Self-pay | Admitting: Cardiology

## 2023-09-20 NOTE — Telephone Encounter (Signed)
Patient has requested metoprolol tartrate 25 mg. He has already had three refills. Pt's last office visit was 05/06/22. He doesn't have a scheduled office visit coming.

## 2023-09-21 NOTE — Telephone Encounter (Signed)
Left message for pt to call and make an appointment for refills.

## 2023-09-29 NOTE — Telephone Encounter (Signed)
Left message for pt to call to make an appointment for refills

## 2023-10-06 NOTE — Progress Notes (Unsigned)
Cardiology Clinic Note   Patient Name: Jacob Downs Date of Encounter: 10/09/2023  Primary Care Provider:  Catha Gosselin, MD Primary Cardiologist:  Olga Millers, MD  Patient Profile    Jacob Downs 62 year old male presents to the clinic today for follow-up evaluation of his coronary artery disease status post CABG x4  on 04/27/2021.   Past Medical History    Past Medical History:  Diagnosis Date   Anginal pain (HCC)    Arthritis    Cellulitis of left hand 11/27/2015   Coronary artery disease    Diabetes mellitus without complication (HCC)    GERD (gastroesophageal reflux disease)    Hyperlipemia    Hypertension    Myocardial infarction (HCC)    Shingles    4-5 years ago   Tuberculosis    As a child   Past Surgical History:  Procedure Laterality Date   CARDIAC CATHETERIZATION N/A 11/30/2015   Procedure: Left Heart Cath and Coronary Angiography;  Surgeon: Kathleene Hazel, MD;  Location: [facility] INVASIVE CV LAB;  Service: Cardiovascular;  Laterality: N/A;   COLONOSCOPY     CORONARY ARTERY BYPASS GRAFT N/A 04/27/2021   Procedure: CORONARY ARTERY BYPASS GRAFTING (CABG) X FOUR,  USING LEFT INTERNAL MAMMARY ARTERY, RIGHT INTERNAL MAMMARY ARTERY, RIGHT RADIAL ARTERY CONDUITS;  Surgeon: Linden Dolin, MD;  Location: [facility] OR;  Service: Open Heart Surgery;  Laterality: N/A;   CORONARY PRESSURE/FFR STUDY N/A 04/15/2021   Procedure: INTRAVASCULAR PRESSURE WIRE/FFR STUDY;  Surgeon: Corky Crafts, MD;  Location: [facility] INVASIVE CV LAB;  Service: Cardiovascular;  Laterality: N/A;   I & D EXTREMITY Right 11/26/2014   Procedure: INCISION AND DRAINAGE OF RIGHT INDEX FINGER;  Surgeon: Dairl Ponder, MD;  Location: [facility] OR;  Service: Orthopedics;  Laterality: Right;   LEFT HEART CATH AND CORONARY ANGIOGRAPHY N/A 04/15/2021   Procedure: LEFT HEART CATH AND CORONARY ANGIOGRAPHY;  Surgeon: Corky Crafts, MD;  Location: [facility] INVASIVE CV LAB;  Service: Cardiovascular;  Laterality:  N/A;   ORIF ANKLE FRACTURE  10/03/2012   Procedure: OPEN REDUCTION INTERNAL FIXATION (ORIF) ANKLE FRACTURE;  Surgeon: Nestor Lewandowsky, MD;  Location: [facility];  Service: Orthopedics;  Laterality: Left;   PLANTAR FASCIA SURGERY  06,07   both feet   RADIAL ARTERY HARVEST Right 04/27/2021   Procedure: possible RADIAL ARTERY HARVEST;  Surgeon: Linden Dolin, MD;  Location: [facility] OR;  Service: Open Heart Surgery;  Laterality: Right;   TEE WITHOUT CARDIOVERSION N/A 04/27/2021   Procedure: TRANSESOPHAGEAL ECHOCARDIOGRAM (TEE);  Surgeon: Linden Dolin, MD;  Location: [facility] OR;  Service: Open Heart Surgery;  Laterality: N/A;   TONSILLECTOMY     as a child    Allergies  Allergies  Allergen Reactions   Glimepiride Other (See Comments)    hypoglycemia    History of Present Illness    Jacob Downs has a PMH of GERD, HLD, HTN, MI, shingles, tuberculosis, diabetes, and coronary artery disease status post cardiac catheterization 04/15/2021 which showed severe multivessel coronary artery disease.  He underwent CABG x4 on 04/27/2021.   He was extubated early on the evening of surgery.  He remained hemodynamically stable and was weaned off his vasopressor medications.  He was started on Lopressor.  His chest tubes, Swan-Ganz catheter, and Foley catheter were removed early in his postoperative course without complication.  Due to postoperative blood loss anemia he did require postoperative blood transfusion.  His hypertension was not well controlled so he was  started on amlodipine.  He was also noted to have some atrial fibrillation postoperatively and was started on amiodarone.  He converted to sinus rhythm.  His oxygen was weaned to 2 L and then to room air.  He tolerated his diet well.  Surgical incisions were healing well without infection.  His epicardial wires were removed on 05/02/2021.  He was tolerating his diet well and passing flatus.  He was discharged in stable condition on  05/03/2021.   He presented to the clinic 05/17/21 for follow-up evaluation stated he was slowly increasing his physical activity and maintaining sternal precautions.  He was trying to avoid extra sodium in his diet and had not been salting his food.  We reviewed the importance of low-cholesterol diet and low-cholesterol foods.  We discussed his coronary artery disease and bypass surgery.  I gave him the salty 6 diet sheet, had him continue to slowly increase his physical activity while maintaining sternal precautions, repeated his fasting lipids and LFTs, and planned follow-up in 3 to 4 months.  He was unclear about some of his medications on his medication list. I asked him to call the office to review his medication list.  He was seen in follow-up by Joni Reining, NP on 05/06/2022.  He remained stable from a cardiac standpoint.  He did have some generalized fatigue.  He remained physically active going up and down stairs several times per day.  He had run out of his Lipitor but was otherwise medically compliant.  He presents to the clinic today for follow-up evaluation and states he continues to do well.  He has been working 12-hour shifts.  He runs a burning breast.  He usually does 3 days on a day off for 2 days on type of schedule.  He does continue to note a chronic chest type ache.  He denies chest pain.  He has been eating a low-sodium diet.  He does occasionally have fried food.  He has not had recent lab work drawn.  I will order CBC CMP and fasting lipids.  I will refill his cardiac medications and plan follow-up in 12 months..   Today he denies chest pain, shortness of breath, lower extremity edema, fatigue, palpitations, melena, hematuria, hemoptysis, diaphoresis, weakness, presyncope, syncope, orthopnea, and PND.     Home Medications    Prior to Admission medications   Medication Sig Start Date End Date Taking? Authorizing Provider  amLODipine (NORVASC) 5 MG tablet TAKE 1 TABLET BY  MOUTH DAILY 06/20/23   Lewayne Bunting, MD  aspirin EC 325 MG EC tablet Take 1 tablet (325 mg total) by mouth daily. 05/03/21   Ardelle Balls, PA-C  atorvastatin (LIPITOR) 80 MG tablet TAKE 1 TABLET BY MOUTH ONCE  DAILY 05/26/23   Jodelle Gross, NP  Cholecalciferol (VITAMIN D3 MAXIMUM STRENGTH) 125 MCG (5000 UT) capsule Take 5,000 Units by mouth in the morning.    [provider]  famotidine (PEPCID) 20 MG tablet Take 40 mg by mouth at bedtime.    [provider]  JARDIANCE 25 MG TABS tablet Take 25 mg by mouth in the morning. 01/09/19   [provider]  metFORMIN (GLUCOPHAGE) 500 MG tablet Take 2 tablets (1,000 mg total) by mouth 2 (two) times daily with a meal. 04/17/21   Corky Crafts, MD  metoprolol tartrate (LOPRESSOR) 25 MG tablet Take 0.5 tablets (12.5 mg total) by mouth 2 (two) times daily. Please call office to schedule an appt for further refills.  Thank you 08/09/23   Lewayne Bunting, MD  Omega-3 Fatty Acids (FISH OIL) 1200 MG CAPS Take 1,200 mg by mouth in the morning.    [provider]    Family History    Family History  Problem Relation Age of Onset   Colon cancer Neg Hx    CAD Other        No family history   He indicated that the status of his neg hx is unknown. He indicated that the status of his other is unknown.  Social History    Social History   Socioeconomic History   Marital status: Married    Spouse name: Not on file   Number of children: 2   Years of education: Not on file   Highest education level: Not on file  Occupational History   Occupation: Works 6p-6a  Tobacco Use   Smoking status: Never   Smokeless tobacco: Never  Vaping Use   Vaping status: Never Used  Substance and Sexual Activity   Alcohol use: Yes    Alcohol/week: 0.0 standard drinks of alcohol    Comment: 6 to 12 beers per week   Drug use: No   Sexual activity: Not on file  Other Topics Concern   Not on file  Social History  Narrative   Not on file   Social Determinants of Health   Financial Resource Strain: Not on file  Food Insecurity: Not on file  Transportation Needs: Not on file  Physical Activity: Not on file  Stress: Not on file  Social Connections: Not on file  Intimate Partner Violence: Not on file     Review of Systems    General:  No chills, fever, night sweats or weight changes.  Cardiovascular:  No chest pain, dyspnea on exertion, edema, orthopnea, palpitations, paroxysmal nocturnal dyspnea. Dermatological: No rash, lesions/masses Respiratory: No cough, dyspnea Urologic: No hematuria, dysuria Abdominal:   No nausea, vomiting, diarrhea, bright red blood per rectum, melena, or hematemesis Neurologic:  No visual changes, wkns, changes in mental status. All other systems reviewed and are otherwise negative except as noted above.  Physical Exam    VS:  BP 134/76 (BP Location: Left Arm, Patient Position: Sitting, Cuff Size: Large)   Pulse 62   Ht 6' (1.829 m)   Wt 254 lb (115.2 kg)   SpO2 94%   BMI 34.45 kg/m  , BMI Body mass index is 34.45 kg/m. GEN: Well nourished, well developed, in no acute distress. HEENT: normal. Neck: Supple, no JVD, carotid bruits, or masses. Cardiac: RRR, no murmurs, rubs, or gallops. No clubbing, cyanosis, edema.  Radials/DP/PT 2+ and equal bilaterally.  Respiratory:  Respirations regular and unlabored, clear to auscultation bilaterally. GI: Soft, nontender, nondistended, BS + x 4. MS: no deformity or atrophy. Skin: warm and dry, no rash. Neuro:  Strength and sensation are intact. Psych: Normal affect.  Accessory Clinical Findings    Recent Labs: No results found for requested labs within last 365 days.   Recent Lipid Panel    Component Value Date/Time   CHOL 224 (H) 05/09/2022 1055   TRIG 123 05/09/2022 1055   HDL 53 05/09/2022 1055   CHOLHDL 4.2 05/09/2022 1055   CHOLHDL 4.2 11/30/2015 0237   VLDL 17 11/30/2015 0237   LDLCALC 149 (H)  05/09/2022 1055         ECG personally reviewed by me today- EKG Interpretation Date/Time:  Monday October 09 2023 08:58:32 EST Ventricular Rate:  62 PR Interval:  128 QRS Duration:  100 QT Interval:  438 QTC Calculation: 444 R Axis:   46  Text Interpretation: Normal sinus rhythm Normal ECG When compared with ECG of 28-Apr-2021 06:57, Nonspecific T wave abnormality, improved in Inferior leads Nonspecific T wave abnormality no longer evident in Lateral leads QT has shortened Confirmed by Edd Fabian [phone]) on 10/09/2023 9:00:29 AM   EKG 05/17/2021 normal sinus rhythm T wave abnormality consider anterior ischemia 70 bpm   Cardiac catheterization 04/15/2021 Ost LAD to Prox LAD lesion is 20% stenosed. Prox LAD to Mid LAD lesion is 90% stenosed. Mid LAD lesion is 70% stenosed. 1st Diag-1 lesion is 75% stenosed. 1st Diag-2 lesion is 75% stenosed. 2nd Mrg lesion is 75% stenosed. 1st Mrg lesion is 20% stenosed. Prox RCA to Dist RCA lesion is 40% stenosed. RPAV lesion is 30% stenosed. Mid Cx lesion is 80% stenosed. Ost Cx to Prox Cx lesion is 25% stenosed. Prox RCA lesion is 60% stenosed. Significant by FFR. The left ventricular systolic function is normal. LV end diastolic pressure is normal. The left ventricular ejection fraction is 55-65% by visual estimate. There is no aortic valve stenosis.   Severe disease in the heavily calcified LAD including bifurcation disease with large diagonal. Bifurcation lesion at the mid circumflex, OM.   RCA with angiographically moderate disease.  Heavily calcified.  FFR 0.77 after IV adenosine.   Plan for cardiac surgery consult for multivessel CAD.  He will needs grafts to LAD, large diagonal, OM2, OM3 and PDA.   Echocardiogram 05/20/2021 IMPRESSIONS     1. Left ventricular ejection fraction, by estimation, is 60 to 65%. The  left ventricle has normal function. Left ventricular endocardial border  not optimally defined to evaluate  regional wall motion. Left ventricular  diastolic parameters are consistent  with Grade I diastolic dysfunction (impaired relaxation).   2. Right ventricular systolic function is normal. The right ventricular  size is normal. Tricuspid regurgitation signal is inadequate for assessing  PA pressure.   3. The mitral valve is grossly normal. Trivial mitral valve  regurgitation. No evidence of mitral stenosis.   4. The aortic valve is tricuspid. Aortic valve regurgitation is not  visualized. No aortic stenosis is present.   5. The inferior vena cava is normal in size with greater than 50%  respiratory variability, suggesting right atrial pressure of 3 mmHg.   Conclusion(s)/Recommendation(s): Normal biventricular function without  evidence of hemodynamically significant valvular heart disease.        Assessment & Plan   1.  Status post CABG 04/27/2021-remains stable from a cardiac standpoint.  Denies chest discomfort.  Continues to be physically active.  Denies exertional chest pain.. Continue  current medical therapy Heart healthy low-sodium diet Maintain physical activity Order CBC, CMP   Postoperative atrial fibrillation-EKG today shows sinus rhythm.  Denies episodes of accelerated or irregular heartbeat. Continue  metoprolol Avoid triggers caffeine, chocolate, EtOH, dehydration etc.  Hyperlipidemia-LDL 75 07/03/2020 Continue atorvastatin, omega-3 fatty acids High-fiber diet Repeat fasting lipids and LFTs   Essential hypertension-BP today 134/76 Maintain blood pressure log Continue current medical therapy   Type 2 diabetes-order CMP today Continue metformin, Jardiance Carb modified diet Follows with PCP  Disposition: Follow-up with Dr. Jens Som or me in 12 months.   Thomasene Ripple. Ursala Cressy NP-C     10/09/2023, 9:20 AM [facility] [address] Office [phone] Fax [phone]    I spent 14 minutes examining this patient,  reviewing medications, and using patient centered  shared decision making involving her cardiac care.   I spent greater than 20 minutes reviewing her past medical history,  medications, and prior cardiac tests.

## 2023-10-09 ENCOUNTER — Encounter: Payer: Self-pay | Admitting: General Practice

## 2023-10-09 ENCOUNTER — Ambulatory Visit: Payer: BC Managed Care – PPO | Attending: General Practice | Admitting: General Practice

## 2023-10-09 VITALS — BP 134/76 | HR 62 | Ht 72.0 in | Wt 254.0 lb

## 2023-10-09 DIAGNOSIS — I10 Essential (primary) hypertension: Secondary | ICD-10-CM

## 2023-10-09 DIAGNOSIS — I2510 Atherosclerotic heart disease of native coronary artery without angina pectoris: Secondary | ICD-10-CM | POA: Diagnosis not present

## 2023-10-09 DIAGNOSIS — E785 Hyperlipidemia, unspecified: Secondary | ICD-10-CM

## 2023-10-09 DIAGNOSIS — Z951 Presence of aortocoronary bypass graft: Secondary | ICD-10-CM

## 2023-10-09 DIAGNOSIS — E118 Type 2 diabetes mellitus with unspecified complications: Secondary | ICD-10-CM

## 2023-10-09 MED ORDER — JARDIANCE 25 MG PO TABS: 25.0000 mg | ORAL_TABLET | Freq: Every morning | ORAL | 3 refills | Status: DC

## 2023-10-09 MED ORDER — METOPROLOL TARTRATE 25 MG PO TABS: 12.5000 mg | ORAL_TABLET | Freq: Two times a day (BID) | ORAL | 3 refills | Status: DC

## 2023-10-09 MED ORDER — AMLODIPINE BESYLATE 5 MG PO TABS: 5.0000 mg | ORAL_TABLET | Freq: Every day | ORAL | 3 refills | Status: DC

## 2023-10-09 MED ORDER — ATORVASTATIN CALCIUM 80 MG PO TABS: ORAL_TABLET | ORAL | 3 refills | Status: DC

## 2023-10-09 NOTE — Patient Instructions (Signed)
Medication Instructions:  No changes  *If you need a refill on your cardiac medications before your next appointment, please call your pharmacy*   Lab Work: CBC, CMP,Lipids   If you have labs (blood work) drawn today and your tests are completely normal, you will receive your results only by: MyChart Message (if you have MyChart) OR A paper copy in the mail If you have any lab test that is abnormal or we need to change your treatment, we will call you to review the results.   Testing/Procedures: None    Follow-Up: At [facility], you and your health needs are our priority.  As part of our continuing mission to provide you with exceptional heart care, we have created designated Provider Care Teams.  These Care Teams include your primary Cardiologist (physician) and Advanced Practice Providers (APPs -  Physician Assistants and Nurse Practitioners) who all work together to provide you with the care you need, when you need it.  We recommend signing up for the patient portal called "MyChart".  Sign up information is provided on this After Visit Summary.  MyChart is used to connect with patients for Virtual Visits (Telemedicine).  Patients are able to view lab/test results, encounter notes, upcoming appointments, etc.  Non-urgent messages can be sent to your provider as well.   To learn more about what you can do with MyChart, go to [email or web address].    Your next appointment:   1 year(s)  Provider:   Olga Millers, MD     Other Instructions Your physician discussed the importance of regular exercise and recommended that you start or continue a regular exercise program for good health.  Go back to the gym: goal 150 min of moderate physical activity per week

## 2023-10-09 NOTE — Addendum Note (Signed)
Addended by: Myriam Jacobson on: 10/09/2023 09:40 AM   Modules accepted: Orders

## 2023-10-10 LAB — COMPREHENSIVE METABOLIC PANEL
ALT: 70 IU/L — ABNORMAL HIGH (ref 0–44)
AST: 43 IU/L — ABNORMAL HIGH (ref 0–40)
Albumin: 4.4 g/dL (ref 3.9–4.9)
Alkaline Phosphatase: 89 IU/L (ref 44–121)
BUN/Creatinine Ratio: 18 (ref 10–24)
BUN: 13 mg/dL (ref 8–27)
Bilirubin Total: 0.7 mg/dL (ref 0.0–1.2)
CO2: 28 mmol/L (ref 20–29)
Calcium: 9.4 mg/dL (ref 8.6–10.2)
Chloride: 102 mmol/L (ref 96–106)
Creatinine, Ser: 0.73 mg/dL — ABNORMAL LOW (ref 0.76–1.27)
Globulin, Total: 2 g/dL (ref 1.5–4.5)
Glucose: 157 mg/dL — ABNORMAL HIGH (ref 70–99)
Potassium: 4.7 mmol/L (ref 3.5–5.2)
Sodium: 142 mmol/L (ref 134–144)
Total Protein: 6.4 g/dL (ref 6.0–8.5)
eGFR: 103 mL/min/{1.73_m2} (ref >59)

## 2023-10-10 LAB — LIPID PANEL
Chol/HDL Ratio: 2.8 ratio (ref 0.0–5.0)
Cholesterol, Total: 128 mg/dL (ref 100–199)
HDL: 46 mg/dL (ref >39)
LDL Chol Calc (NIH): 65 mg/dL (ref 0–99)
Triglycerides: 87 mg/dL (ref 0–149)
VLDL Cholesterol Cal: 17 mg/dL (ref 5–40)

## 2023-10-10 LAB — CBC
Hematocrit: 45.1 % (ref 37.5–51.0)
Hemoglobin: 15.2 g/dL (ref 13.0–17.7)
MCH: 32.7 pg (ref 26.6–33.0)
MCHC: 33.7 g/dL (ref 31.5–35.7)
MCV: 97 fL (ref 79–97)
Platelets: 146 10*3/uL — ABNORMAL LOW (ref 150–450)
RBC: 4.65 x10E6/uL (ref 4.14–5.80)
RDW: 13.4 % (ref 11.6–15.4)
WBC: 3.3 10*3/uL — ABNORMAL LOW (ref 3.4–10.8)

## 2024-08-04 ENCOUNTER — Other Ambulatory Visit: Payer: Self-pay | Admitting: General Practice

## 2024-09-30 ENCOUNTER — Other Ambulatory Visit: Payer: Self-pay | Admitting: General Practice

## 2024-09-30 DIAGNOSIS — I2510 Atherosclerotic heart disease of native coronary artery without angina pectoris: Secondary | ICD-10-CM

## 2024-10-15 ENCOUNTER — Other Ambulatory Visit: Payer: Self-pay | Admitting: General Practice

## 2024-10-22 NOTE — Telephone Encounter (Signed)
 Pt of Dr. Pietro. At last O/V with [name] NP, this RX was refilled. Does Dr. Pietro want to refill this RX? Please advise.

## 2024-10-23 NOTE — Telephone Encounter (Signed)
 Left message for pt to call and schedule appointment

## 2024-11-13 ENCOUNTER — Other Ambulatory Visit: Payer: Self-pay | Admitting: Cardiology

## 2024-11-13 DIAGNOSIS — I2510 Atherosclerotic heart disease of native coronary artery without angina pectoris: Secondary | ICD-10-CM

## 2024-11-29 ENCOUNTER — Other Ambulatory Visit: Payer: Self-pay | Admitting: Cardiology

## 2024-12-05 NOTE — Progress Notes (Unsigned)
 "  Cardiology Office Note    Date:  12/05/2024  ID:  Jacob Downs, DOB 06/08/1961, MRN [id number] PCP:  Jacob Drivers, MD  Cardiologist:  Jacob Shallow, MD  Electrophysiologist:  None   Chief Complaint: ***  History of Present Illness: .    Jacob Downs is a 64 y.o. male with visit-pertinent history of CAD, hypertension, hyperlipidemia, type 2 diabetes mellitus and GERD.  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 The LAD was noted to be moderate in caliber proximally and heavily calcified. The vessel quickly becomes small in caliber beyond the takeoff of the 1st Diag branch. There was a 99% stenosis of the mid LAD. The vessel is very small and diffusely disease beyond the severe stenosis. It was too small for PCI; therefore, she was treated medically. Also noted to have mild non-obstructive RCA and LCX disease. She was started on Imdur . Echo at this time showed LVEF of 50-55% with grade 1 diastolic dysfunction. No significant valvular disease.   In 2022 patient underwent cardiac catheterization in setting of progressive chest pain, showed severe multivessel coronary artery disease.  Patient underwent CABG x 4 with LIMA to LAD, pedicled RIMA to PDA, right radial artery to second of 2 marginal branches of left circumflex and second diagonal branch as sequenced graft to open right radial artery harvesting in 04/2021.  Postoperatively went into atrial fibrillation and started on amiodarone .  Patient was last seen in clinic by Jacob Beauvais, NP on 10/09/2023.  He remained stable from a cardiac standpoint.  Today he presents for follow-up.  He reports that he   CAD: Status post CABG x 4 by Jacob Downs on 04/27/2021 with LIMA to LAD, pedicled RIMA to PDA, right radial artery to second obtuse marginal branches of the left circumflex and second diagonal branch sequential graft open right radial artery harvesting. Today he  reports that he Reviewed ED precautions. Continue  Hypertension: Blood pressure today   Hyperlipidemia: Last lipid profile on  Type 2 DM:   Labwork independently reviewed:   ROS: .   *** denies chest pain, shortness of breath, lower extremity edema, fatigue, palpitations, melena, hematuria, hemoptysis, diaphoresis, weakness, presyncope, syncope, orthopnea, and PND.  All other systems are reviewed and otherwise negative.  Studies Reviewed: SABRA    EKG:  EKG is ordered today, personally reviewed, demonstrating ***     CV Studies: Cardiac studies reviewed are outlined and summarized above. Otherwise please see EMR for full report. Cardiac Studies & Procedures   ______________________________________________________________________________________________ CARDIAC CATHETERIZATION  CARDIAC CATHETERIZATION 04/15/2021  Conclusion  Ost LAD to Prox LAD lesion is 20% stenosed.  Prox LAD to Mid LAD lesion is 90% stenosed.  Mid LAD lesion is 70% stenosed.  1st Diag-1 lesion is 75% stenosed.  1st Diag-2 lesion is 75% stenosed.  2nd Mrg lesion is 75% stenosed.  1st Mrg lesion is 20% stenosed.  Prox RCA to Dist RCA lesion is 40% stenosed.  RPAV lesion is 30% stenosed.  Mid Cx lesion is 80% stenosed.  Ost Cx to Prox Cx lesion is 25% stenosed.  Prox RCA lesion is 60% stenosed. Significant by FFR.  The left ventricular systolic function is normal.  LV end diastolic pressure is normal.  The left ventricular ejection fraction is 55-65% by visual estimate.  There is no aortic valve stenosis.  Severe disease in the heavily calcified LAD including bifurcation disease with large diagonal. Bifurcation lesion at the mid circumflex, OM.  RCA with angiographically moderate disease.  Heavily calcified.  FFR 0.77 after IV adenosine .  Plan for cardiac surgery consult for multivessel CAD.  He will needs grafts to LAD, large diagonal, OM2, OM3 and PDA.  Findings Coronary  Findings Diagnostic  Dominance: Right  Left Anterior Descending Vessel is moderate in size. Collaterals Dist LAD filled by collaterals from RPDA.  Ost LAD to Prox LAD lesion is 20% stenosed. The lesion is calcified. Prox LAD to Mid LAD lesion is 90% stenosed. The lesion is discrete. Mid LAD lesion is 70% stenosed. The lesion is segmental.  First Diagonal Branch Vessel is small in size. 1st Diag-1 lesion is 75% stenosed. 1st Diag-2 lesion is 75% stenosed.  Ramus Intermedius Vessel is small.  Left Circumflex Ost Cx to Prox Cx lesion is 25% stenosed. Mid Cx lesion is 80% stenosed.  First Obtuse Marginal Branch Vessel is moderate in size. 1st Mrg lesion is 20% stenosed. The lesion is segmental.  Second Obtuse Marginal Branch Vessel is moderate in size. 2nd Mrg lesion is 75% stenosed. The lesion is segmental.  Third Obtuse Marginal Branch Vessel is small in size.  Right Coronary Artery Vessel is large. Prox RCA lesion is 60% stenosed. Prox RCA to Dist RCA lesion is 40% stenosed. The lesion is segmental.  Right Posterior Descending Artery Vessel is moderate in size.  Right Posterior Atrioventricular Artery Vessel is small in size. RPAV lesion is 30% stenosed. The lesion is segmental.  Intervention  No interventions have been documented.   CARDIAC CATHETERIZATION  CARDIAC CATHETERIZATION 11/30/2015  Conclusion 1. Severe single vessel CAD 2. The LAD is moderate in caliber proximally and is heavily calcified. The vessel quickly becomes small in caliber beyond the takeoff of the first diagonal branch. There is a 99% stenosis in the mid LAD just beyond the diagonal branch. The vessel is very small and diffusely diseased beyond the severe stenosis. The LAD is too small for PCI/stenting. The Diagonal branch is small in caliber with moderate proximal stenosis. 3. The RCA and Circumflex has mild non-obstructive disease. 4. Mild segmental LV systolic  dysfunction.  Recommendations: His LAD is the likely culprit but it is a small, diffusely diseased vessel and not a favorable target for PCI/stenting. Will recommend medical management. Will start Imdur . Continue ASA, beta blocker and statin. Will arrange echo today.  Findings Coronary Findings Diagnostic  Dominance: Right  Left Anterior Descending . Vessel is moderate in size. Calcified. Discrete. Diffuse.  First Diagonal Branch The vessel is small in size. Diffuse.  Ramus Intermedius . Vessel is small.  Left Circumflex  First Obtuse Marginal Branch The vessel is moderate in size. Diffuse.  Second Obtuse Marginal Branch The vessel is moderate in size. Diffuse.  Third Obtuse Marginal Branch The vessel is small in size.  Right Coronary Artery . Vessel is large. Diffuse.  Right Posterior Descending Artery The vessel is moderate in size.  Right Posterior Atrioventricular Artery The vessel is small in size. Diffuse.  Intervention  No interventions have been documented.     ECHOCARDIOGRAM  ECHOCARDIOGRAM COMPLETE 04/23/2021  Narrative ECHOCARDIOGRAM REPORT    Patient Name:   Jacob Downs Date of Exam: 04/23/2021 Medical Rec #:  [id number]        Height:       72.0 in Accession #:    [id number]       Weight:       247.2 lb Date of Birth:  [birth year]        BSA:  2.331 m Patient Age:    60 years         BP:           121/82 mmHg Patient Gender: M                HR:           74 bpm. Exam Location:  Outpatient  Procedure: 2D Echo, Color Doppler and Cardiac Doppler  Indications:    CAD Native Vessel i25.10  History:        Patient has prior history of Echocardiogram examinations, most recent 11/30/2015. CAD; Risk Factors:Hypertension, Diabetes and Dyslipidemia.  Sonographer:    Damien Senior RDCS Referring Phys: [id number] BROADUS Z ATKINS  IMPRESSIONS   1. Left ventricular ejection fraction, by estimation, is 60 to 65%. The left ventricle has  normal function. Left ventricular endocardial border not optimally defined to evaluate regional wall motion. Left ventricular diastolic parameters are consistent with Grade I diastolic dysfunction (impaired relaxation). 2. Right ventricular systolic function is normal. The right ventricular size is normal. Tricuspid regurgitation signal is inadequate for assessing PA pressure. 3. The mitral valve is grossly normal. Trivial mitral valve regurgitation. No evidence of mitral stenosis. 4. The aortic valve is tricuspid. Aortic valve regurgitation is not visualized. No aortic stenosis is present. 5. The inferior vena cava is normal in size with greater than 50% respiratory variability, suggesting right atrial pressure of 3 mmHg.  Conclusion(s)/Recommendation(s): Normal biventricular function without evidence of hemodynamically significant valvular heart disease.  FINDINGS Left Ventricle: Left ventricular ejection fraction, by estimation, is 60 to 65%. The left ventricle has normal function. Left ventricular endocardial border not optimally defined to evaluate regional wall motion. The left ventricular internal cavity size was normal in size. There is no left ventricular hypertrophy. Left ventricular diastolic parameters are consistent with Grade I diastolic dysfunction (impaired relaxation).  Right Ventricle: The right ventricular size is normal. No increase in right ventricular wall thickness. Right ventricular systolic function is normal. Tricuspid regurgitation signal is inadequate for assessing PA pressure.  Left Atrium: Left atrial size was normal in size.  Right Atrium: Right atrial size was normal in size.  Pericardium: Trivial pericardial effusion is present.  Mitral Valve: The mitral valve is grossly normal. Trivial mitral valve regurgitation. No evidence of mitral valve stenosis.  Tricuspid Valve: The tricuspid valve is grossly normal. Tricuspid valve regurgitation is not demonstrated. No  evidence of tricuspid stenosis.  Aortic Valve: The aortic valve is tricuspid. Aortic valve regurgitation is not visualized. No aortic stenosis is present.  Pulmonic Valve: The pulmonic valve was grossly normal. Pulmonic valve regurgitation is trivial. No evidence of pulmonic stenosis.  Aorta: The aortic root and ascending aorta are structurally normal, with no evidence of dilitation.  Venous: The inferior vena cava is normal in size with greater than 50% respiratory variability, suggesting right atrial pressure of 3 mmHg.  IAS/Shunts: The atrial septum is grossly normal.   LEFT VENTRICLE PLAX 2D LVIDd:         4.90 cm  Diastology LVIDs:         2.90 cm  LV e' medial:    5.11 cm/s LV PW:         1.10 cm  LV E/e' medial:  8.7 LV IVS:        0.80 cm  LV e' lateral:   9.25 cm/s LVOT diam:     2.20 cm  LV E/e' lateral: 4.8 LV SV:  53 LV SV Index:   23 LVOT Area:     3.80 cm   RIGHT VENTRICLE RV S prime:     12.60 cm/s TAPSE (M-mode): 2.0 cm  LEFT ATRIUM             Index       RIGHT ATRIUM           Index LA diam:        3.40 cm 1.46 cm/m  RA Area:     16.20 cm LA Vol (A2C):   45.0 ml 19.30 ml/m RA Volume:   47.30 ml  20.29 ml/m LA Vol (A4C):   58.7 ml 25.18 ml/m LA Biplane Vol: 53.4 ml 22.91 ml/m AORTIC VALVE LVOT Vmax:   66.60 cm/s LVOT Vmean:  51.400 cm/s LVOT VTI:    0.139 m  AORTA Ao Root diam: 3.60 cm Ao Asc diam:  3.50 cm  MITRAL VALVE MV Area (PHT): 2.07 cm    SHUNTS MV Decel Time: 366 msec    Systemic VTI:  0.14 m MV E velocity: 44.60 cm/s  Systemic Diam: 2.20 cm MV A velocity: 55.30 cm/s MV E/A ratio:  0.81  Jacob Decent MD Electronically signed by Jacob Decent MD Signature Date/Time: 04/23/2021/4:51:12 PM    Final   TEE  ECHO INTRAOPERATIVE TEE 04/27/2021  Narrative *INTRAOPERATIVE TRANSESOPHAGEAL REPORT *    Patient Name:   Jacob Downs Date of Exam: 04/27/2021 Medical Rec #:  [id number]        Height:       72.0 in Accession  #:    [id number]       Weight:       248.0 lb Date of Birth:  December 14, 1960        BSA:          2.33 m Patient Age:    60 years         BP:           145/80 mmHg Patient Gender: M                HR:           75 bpm. Exam Location:  Anesthesiology  Transesophogeal exam was perform intraoperatively during surgical procedure. Patient was closely monitored under general anesthesia during the entirety of examination.  Indications:     CAD, multiple vessel [I25.10 Sonographer:     Annabella Fell RDCS Performing Phys: [id number] AMNJILD Z ATKINS Diagnosing Phys: Elsie Needle MD  Complications: No known complications during this procedure. POST-OP IMPRESSIONS Overall, there were no significant changes from pre-bypass.  PRE-OP FINDINGS Left Ventricle: The left ventricle has mildly reduced systolic function, with an ejection fraction of 45-50%. The cavity size was normal. There is no increase in left ventricular wall thickness. There is no left ventricular hypertrophy.   Right Ventricle: The right ventricle has normal systolic function. The cavity was not assessed. There is no increase in right ventricular wall thickness.  Left Atrium: Left atrial size was not assessed. No left atrial/left atrial appendage thrombus was detected.  Right Atrium: Right atrial size was not assessed.  Interatrial Septum: No atrial level shunt detected by color flow Doppler.  Pericardium: There is no evidence of pericardial effusion.  Mitral Valve: The mitral valve is normal in structure. Mitral valve regurgitation is trivial by color flow Doppler.  Tricuspid Valve: The tricuspid valve was normal in structure. Tricuspid valve regurgitation is trivial by color flow Doppler.  Aortic Valve: The aortic valve  is normal in structure. Aortic valve regurgitation was not visualized by color flow Doppler.   Pulmonic Valve: The pulmonic valve was normal in structure. Pulmonic valve regurgitation is trivial by color  flow Doppler.   +--------------+--------++ LEFT VENTRICLE         +--------------+--------++ PLAX 2D                +--------------+--------++ LVIDd:        4.70 cm  +--------------+--------++ LVIDs:        3.55 cm  +--------------+--------++ LVOT diam:    1.90 cm  +--------------+--------++ LV SV:        50 ml    +--------------+--------++ LV SV Index:  20.50    +--------------+--------++ LVOT Area:    2.84 cm +--------------+--------++                        +--------------+--------++   +-------------+-------++ AORTA                +-------------+-------++ Ao Root diam:3.80 cm +-------------+-------++ Ao Asc diam: 3.50 cm +-------------+-------++   +--------------+-------+ SHUNTS                +--------------+-------+ Systemic Diam:1.90 cm +--------------+-------+   Elsie Needle MD Electronically signed by Elsie Needle MD Signature Date/Time: 04/27/2021/2:51:31 PM    Final        ______________________________________________________________________________________________       Current Reported Medications:.    Active Medications[1]  Physical Exam:    VS:  There were no vitals taken for this visit.   Wt Readings from Last 3 Encounters:  10/09/23 254 lb (115.2 kg)  05/06/22 252 lb 3.2 oz (114.4 kg)  05/25/21 237 lb (107.5 kg)    GEN: Well nourished, well developed in no acute distress NECK: No JVD; No carotid bruits CARDIAC: ***RRR, no murmurs, rubs, gallops RESPIRATORY:  Clear to auscultation without rales, wheezing or rhonchi  ABDOMEN: Soft, non-tender, non-distended EXTREMITIES:  No edema; No acute deformity     Asessement and Plan:.     ***     Disposition: F/u with ***  Signed, Herlinda Heady D Eithen Castiglia, NP      [1]  No outpatient medications have been marked as taking for the 12/06/24 encounter (Appointment) with Murriel Holwerda D, NP.   "

## 2024-12-06 ENCOUNTER — Ambulatory Visit: Admitting: Cardiology

## 2024-12-06 DIAGNOSIS — E785 Hyperlipidemia, unspecified: Secondary | ICD-10-CM

## 2024-12-06 DIAGNOSIS — E118 Type 2 diabetes mellitus with unspecified complications: Secondary | ICD-10-CM

## 2024-12-06 DIAGNOSIS — I10 Essential (primary) hypertension: Secondary | ICD-10-CM

## 2024-12-06 DIAGNOSIS — I2510 Atherosclerotic heart disease of native coronary artery without angina pectoris: Secondary | ICD-10-CM

## 2024-12-06 DIAGNOSIS — Z951 Presence of aortocoronary bypass graft: Secondary | ICD-10-CM

## 2024-12-13 ENCOUNTER — Other Ambulatory Visit: Payer: Self-pay | Admitting: Cardiology

## 2025-01-31 ENCOUNTER — Ambulatory Visit: Admitting: Cardiology
